# Patient Record
Sex: Female | Born: 1935 | Race: White | Hispanic: No | Marital: Married | Smoking: Never smoker
Health system: Southern US, Community
[De-identification: ages and names within clinical notes are randomized; demographics above are authoritative.]

## PROBLEM LIST (undated history)

## (undated) DIAGNOSIS — I480 Paroxysmal atrial fibrillation: Secondary | ICD-10-CM

## (undated) DIAGNOSIS — M549 Dorsalgia, unspecified: Secondary | ICD-10-CM

## (undated) DIAGNOSIS — H538 Other visual disturbances: Secondary | ICD-10-CM

## (undated) DIAGNOSIS — R079 Chest pain, unspecified: Secondary | ICD-10-CM

## (undated) DIAGNOSIS — J209 Acute bronchitis, unspecified: Secondary | ICD-10-CM

## (undated) DIAGNOSIS — K219 Gastro-esophageal reflux disease without esophagitis: Secondary | ICD-10-CM

## (undated) DIAGNOSIS — F0390 Unspecified dementia without behavioral disturbance: Secondary | ICD-10-CM

## (undated) DIAGNOSIS — F339 Major depressive disorder, recurrent, unspecified: Secondary | ICD-10-CM

## (undated) DIAGNOSIS — R5381 Other malaise: Secondary | ICD-10-CM

## (undated) DIAGNOSIS — I2510 Atherosclerotic heart disease of native coronary artery without angina pectoris: Secondary | ICD-10-CM

## (undated) DIAGNOSIS — I10 Essential (primary) hypertension: Secondary | ICD-10-CM

## (undated) DIAGNOSIS — R05 Cough: Secondary | ICD-10-CM

## (undated) DIAGNOSIS — E785 Hyperlipidemia, unspecified: Secondary | ICD-10-CM

## (undated) DIAGNOSIS — IMO0002 Reserved for concepts with insufficient information to code with codable children: Secondary | ICD-10-CM

## (undated) DIAGNOSIS — J309 Allergic rhinitis, unspecified: Secondary | ICD-10-CM

## (undated) DIAGNOSIS — F519 Sleep disorder not due to a substance or known physiological condition, unspecified: Secondary | ICD-10-CM

## (undated) DIAGNOSIS — M810 Age-related osteoporosis without current pathological fracture: Secondary | ICD-10-CM

## (undated) DIAGNOSIS — E049 Nontoxic goiter, unspecified: Secondary | ICD-10-CM

## (undated) DIAGNOSIS — R053 Chronic cough: Secondary | ICD-10-CM

## (undated) DIAGNOSIS — K5900 Constipation, unspecified: Secondary | ICD-10-CM

## (undated) DIAGNOSIS — I639 Cerebral infarction, unspecified: Secondary | ICD-10-CM

## (undated) DIAGNOSIS — R5383 Other fatigue: Secondary | ICD-10-CM

## (undated) DIAGNOSIS — Z8679 Personal history of other diseases of the circulatory system: Secondary | ICD-10-CM

## (undated) HISTORY — DX: Unspecified dementia without behavioral disturbance: F03.90

## (undated) HISTORY — DX: Hyperlipidemia, unspecified: E78.5

## (undated) HISTORY — DX: Constipation, unspecified: K59.00

## (undated) HISTORY — DX: Atherosclerotic heart disease of native coronary artery without angina pectoris: I25.10

## (undated) HISTORY — DX: Chronic cough: R05.3

## (undated) HISTORY — DX: Personal history of other diseases of the circulatory system: Z86.79

## (undated) HISTORY — DX: Essential (primary) hypertension: I10

## (undated) HISTORY — DX: Sleep disorder not due to a substance or known physiological condition, unspecified: F51.9

## (undated) HISTORY — DX: Cough: R05

## (undated) HISTORY — DX: Gastro-esophageal reflux disease without esophagitis: K21.9

## (undated) HISTORY — DX: Nontoxic goiter, unspecified: E04.9

## (undated) HISTORY — DX: Age-related osteoporosis without current pathological fracture: M81.0

## (undated) HISTORY — DX: Other visual disturbances: H53.8

## (undated) HISTORY — DX: Allergic rhinitis, unspecified: J30.9

## (undated) HISTORY — DX: Other malaise: R53.81

## (undated) HISTORY — DX: Other fatigue: R53.83

## (undated) HISTORY — DX: Acute bronchitis, unspecified: J20.9

## (undated) HISTORY — DX: Chest pain, unspecified: R07.9

## (undated) HISTORY — PX: DILATION AND CURETTAGE OF UTERUS: SHX78

## (undated) HISTORY — DX: Dorsalgia, unspecified: M54.9

---

## 1997-06-22 ENCOUNTER — Ambulatory Visit (HOSPITAL_COMMUNITY): Admission: RE | Admit: 1997-06-22 | Discharge: 1997-06-22 | Payer: Self-pay | Admitting: Gastroenterology

## 1998-07-12 ENCOUNTER — Ambulatory Visit (HOSPITAL_COMMUNITY): Admission: RE | Admit: 1998-07-12 | Discharge: 1998-07-12 | Payer: Self-pay | Admitting: Gastroenterology

## 1998-09-09 ENCOUNTER — Encounter: Payer: Self-pay | Admitting: Surgery

## 1998-09-09 ENCOUNTER — Ambulatory Visit (HOSPITAL_COMMUNITY): Admission: RE | Admit: 1998-09-09 | Discharge: 1998-09-09 | Payer: Self-pay | Admitting: Surgery

## 1998-10-05 ENCOUNTER — Ambulatory Visit (HOSPITAL_COMMUNITY): Admission: RE | Admit: 1998-10-05 | Discharge: 1998-10-05 | Payer: Self-pay | Admitting: Gastroenterology

## 2000-06-08 ENCOUNTER — Ambulatory Visit (HOSPITAL_COMMUNITY): Admission: RE | Admit: 2000-06-08 | Discharge: 2000-06-08 | Payer: Self-pay | Admitting: Gastroenterology

## 2000-06-08 ENCOUNTER — Encounter (INDEPENDENT_AMBULATORY_CARE_PROVIDER_SITE_OTHER): Payer: Self-pay | Admitting: *Deleted

## 2002-02-07 ENCOUNTER — Encounter: Payer: Self-pay | Admitting: Otolaryngology

## 2002-02-07 ENCOUNTER — Encounter: Admission: RE | Admit: 2002-02-07 | Discharge: 2002-02-07 | Payer: Self-pay | Admitting: Otolaryngology

## 2002-06-26 ENCOUNTER — Ambulatory Visit (HOSPITAL_COMMUNITY): Admission: RE | Admit: 2002-06-26 | Discharge: 2002-06-26 | Payer: Self-pay | Admitting: *Deleted

## 2002-06-26 ENCOUNTER — Encounter (INDEPENDENT_AMBULATORY_CARE_PROVIDER_SITE_OTHER): Payer: Self-pay | Admitting: Specialist

## 2003-11-28 ENCOUNTER — Inpatient Hospital Stay (HOSPITAL_COMMUNITY): Admission: EM | Admit: 2003-11-28 | Discharge: 2003-12-10 | Payer: Self-pay | Admitting: Emergency Medicine

## 2003-12-12 ENCOUNTER — Emergency Department (HOSPITAL_COMMUNITY): Admission: EM | Admit: 2003-12-12 | Discharge: 2003-12-13 | Payer: Self-pay | Admitting: Emergency Medicine

## 2003-12-21 ENCOUNTER — Encounter
Admission: RE | Admit: 2003-12-21 | Discharge: 2003-12-21 | Payer: Self-pay | Admitting: Thoracic Surgery (Cardiothoracic Vascular Surgery)

## 2004-01-26 ENCOUNTER — Encounter (HOSPITAL_COMMUNITY): Admission: RE | Admit: 2004-01-26 | Discharge: 2004-04-25 | Payer: Self-pay | Admitting: Cardiology

## 2004-04-08 ENCOUNTER — Ambulatory Visit: Payer: Self-pay | Admitting: Internal Medicine

## 2004-05-06 ENCOUNTER — Ambulatory Visit: Payer: Self-pay | Admitting: Internal Medicine

## 2004-06-02 ENCOUNTER — Ambulatory Visit: Payer: Self-pay | Admitting: Pulmonary Disease

## 2004-08-12 ENCOUNTER — Ambulatory Visit: Payer: Self-pay | Admitting: Internal Medicine

## 2004-09-09 ENCOUNTER — Ambulatory Visit: Payer: Self-pay | Admitting: Internal Medicine

## 2004-11-01 ENCOUNTER — Ambulatory Visit: Payer: Self-pay | Admitting: Internal Medicine

## 2004-12-02 ENCOUNTER — Encounter (INDEPENDENT_AMBULATORY_CARE_PROVIDER_SITE_OTHER): Payer: Self-pay | Admitting: Specialist

## 2004-12-02 ENCOUNTER — Ambulatory Visit (HOSPITAL_COMMUNITY): Admission: RE | Admit: 2004-12-02 | Discharge: 2004-12-02 | Payer: Self-pay | Admitting: Gastroenterology

## 2004-12-21 ENCOUNTER — Ambulatory Visit: Payer: Self-pay | Admitting: Internal Medicine

## 2005-01-04 ENCOUNTER — Ambulatory Visit: Payer: Self-pay | Admitting: Cardiovascular Disease

## 2005-01-04 ENCOUNTER — Encounter: Payer: Self-pay | Admitting: Cardiovascular Disease

## 2005-01-04 ENCOUNTER — Inpatient Hospital Stay (HOSPITAL_COMMUNITY): Admission: EM | Admit: 2005-01-04 | Discharge: 2005-01-05 | Payer: Self-pay | Admitting: Emergency Medicine

## 2005-01-12 ENCOUNTER — Ambulatory Visit: Payer: Self-pay | Admitting: Internal Medicine

## 2005-01-27 ENCOUNTER — Ambulatory Visit: Payer: Self-pay | Admitting: Internal Medicine

## 2005-01-31 ENCOUNTER — Ambulatory Visit: Payer: Self-pay | Admitting: Internal Medicine

## 2005-03-02 ENCOUNTER — Ambulatory Visit: Payer: Self-pay | Admitting: Internal Medicine

## 2005-03-09 ENCOUNTER — Ambulatory Visit: Payer: Self-pay | Admitting: Internal Medicine

## 2005-05-03 ENCOUNTER — Ambulatory Visit: Payer: Self-pay | Admitting: Internal Medicine

## 2005-05-17 ENCOUNTER — Ambulatory Visit: Payer: Self-pay | Admitting: Internal Medicine

## 2005-05-18 ENCOUNTER — Ambulatory Visit: Payer: Self-pay | Admitting: Cardiology

## 2005-06-02 ENCOUNTER — Ambulatory Visit: Payer: Self-pay | Admitting: Internal Medicine

## 2005-06-16 ENCOUNTER — Ambulatory Visit: Payer: Self-pay | Admitting: Endocrinology

## 2005-06-23 ENCOUNTER — Ambulatory Visit: Payer: Self-pay | Admitting: Internal Medicine

## 2005-06-29 ENCOUNTER — Ambulatory Visit: Payer: Self-pay | Admitting: Internal Medicine

## 2005-07-10 ENCOUNTER — Ambulatory Visit: Payer: Self-pay | Admitting: Internal Medicine

## 2005-07-21 ENCOUNTER — Ambulatory Visit (HOSPITAL_BASED_OUTPATIENT_CLINIC_OR_DEPARTMENT_OTHER): Admission: RE | Admit: 2005-07-21 | Discharge: 2005-07-21 | Payer: Self-pay | Admitting: Orthopedic Surgery

## 2005-08-03 ENCOUNTER — Encounter: Admission: RE | Admit: 2005-08-03 | Discharge: 2005-08-30 | Payer: Self-pay | Admitting: Orthopedic Surgery

## 2005-08-21 ENCOUNTER — Ambulatory Visit: Payer: Self-pay | Admitting: Internal Medicine

## 2005-08-29 ENCOUNTER — Ambulatory Visit: Payer: Self-pay | Admitting: Internal Medicine

## 2005-08-31 ENCOUNTER — Encounter: Admission: RE | Admit: 2005-08-31 | Discharge: 2005-09-21 | Payer: Self-pay | Admitting: Orthopedic Surgery

## 2005-10-09 ENCOUNTER — Ambulatory Visit: Payer: Self-pay | Admitting: Internal Medicine

## 2005-10-16 ENCOUNTER — Ambulatory Visit: Payer: Self-pay | Admitting: Internal Medicine

## 2005-12-06 ENCOUNTER — Ambulatory Visit: Payer: Self-pay | Admitting: Internal Medicine

## 2005-12-21 ENCOUNTER — Ambulatory Visit: Payer: Self-pay | Admitting: Internal Medicine

## 2006-01-23 ENCOUNTER — Ambulatory Visit: Payer: Self-pay | Admitting: Internal Medicine

## 2006-02-07 ENCOUNTER — Ambulatory Visit: Payer: Self-pay | Admitting: Internal Medicine

## 2006-05-22 ENCOUNTER — Ambulatory Visit: Payer: Self-pay | Admitting: Internal Medicine

## 2006-06-04 DIAGNOSIS — K219 Gastro-esophageal reflux disease without esophagitis: Secondary | ICD-10-CM

## 2006-06-04 HISTORY — DX: Gastro-esophageal reflux disease without esophagitis: K21.9

## 2006-07-23 ENCOUNTER — Ambulatory Visit: Payer: Self-pay | Admitting: Internal Medicine

## 2006-07-24 DIAGNOSIS — Z8679 Personal history of other diseases of the circulatory system: Secondary | ICD-10-CM

## 2006-07-24 DIAGNOSIS — I2510 Atherosclerotic heart disease of native coronary artery without angina pectoris: Secondary | ICD-10-CM

## 2006-07-24 HISTORY — DX: Atherosclerotic heart disease of native coronary artery without angina pectoris: I25.10

## 2006-07-24 HISTORY — DX: Personal history of other diseases of the circulatory system: Z86.79

## 2006-09-05 ENCOUNTER — Ambulatory Visit: Payer: Self-pay | Admitting: Internal Medicine

## 2006-09-05 LAB — CONVERTED CEMR LAB
Basophils Absolute: 0.1 10*3/uL (ref 0.0–0.1)
Basophils Relative: 1.2 % — ABNORMAL HIGH (ref 0.0–1.0)
Cholesterol: 223 mg/dL (ref 0–200)
Direct LDL: 134.6 mg/dL
Eosinophils Absolute: 0.1 10*3/uL (ref 0.0–0.6)
Eosinophils Relative: 1.1 % (ref 0.0–5.0)
HCT: 37.4 % (ref 36.0–46.0)
HDL: 29.8 mg/dL — ABNORMAL LOW (ref >39.0)
Hemoglobin: 13.2 g/dL (ref 12.0–15.0)
Lymphocytes Relative: 30.9 % (ref 12.0–46.0)
MCHC: 35.2 g/dL (ref 30.0–36.0)
MCV: 92.5 fL (ref 78.0–100.0)
Monocytes Absolute: 0.4 10*3/uL (ref 0.2–0.7)
Monocytes Relative: 6.8 % (ref 3.0–11.0)
Neutro Abs: 3.2 10*3/uL (ref 1.4–7.7)
Neutrophils Relative %: 60 % (ref 43.0–77.0)
Platelets: 211 10*3/uL (ref 150–400)
RBC: 4.05 M/uL (ref 3.87–5.11)
RDW: 11.9 % (ref 11.5–14.6)
Total CHOL/HDL Ratio: 7.5
Triglycerides: 275 mg/dL (ref 0–149)
VLDL: 55 mg/dL — ABNORMAL HIGH (ref 0–40)
WBC: 5.5 10*3/uL (ref 4.5–10.5)

## 2006-09-13 ENCOUNTER — Ambulatory Visit (HOSPITAL_COMMUNITY): Admission: RE | Admit: 2006-09-13 | Discharge: 2006-09-13 | Payer: Self-pay | Admitting: Internal Medicine

## 2006-09-20 ENCOUNTER — Encounter: Admission: RE | Admit: 2006-09-20 | Discharge: 2006-09-20 | Payer: Self-pay | Admitting: Internal Medicine

## 2006-10-01 ENCOUNTER — Ambulatory Visit: Payer: Self-pay | Admitting: Internal Medicine

## 2006-11-07 ENCOUNTER — Telehealth: Payer: Self-pay | Admitting: Internal Medicine

## 2006-11-09 ENCOUNTER — Ambulatory Visit: Payer: Self-pay | Admitting: Internal Medicine

## 2006-11-09 DIAGNOSIS — J309 Allergic rhinitis, unspecified: Secondary | ICD-10-CM

## 2006-11-09 DIAGNOSIS — F519 Sleep disorder not due to a substance or known physiological condition, unspecified: Secondary | ICD-10-CM | POA: Insufficient documentation

## 2006-11-09 DIAGNOSIS — K140 Glossitis: Secondary | ICD-10-CM | POA: Insufficient documentation

## 2006-11-09 DIAGNOSIS — I10 Essential (primary) hypertension: Secondary | ICD-10-CM | POA: Insufficient documentation

## 2006-11-09 DIAGNOSIS — J45909 Unspecified asthma, uncomplicated: Secondary | ICD-10-CM | POA: Insufficient documentation

## 2006-11-09 DIAGNOSIS — J069 Acute upper respiratory infection, unspecified: Secondary | ICD-10-CM | POA: Insufficient documentation

## 2006-11-09 HISTORY — DX: Essential (primary) hypertension: I10

## 2006-11-09 HISTORY — DX: Allergic rhinitis, unspecified: J30.9

## 2006-11-09 HISTORY — DX: Sleep disorder not due to a substance or known physiological condition, unspecified: F51.9

## 2006-12-07 ENCOUNTER — Encounter: Payer: Self-pay | Admitting: Internal Medicine

## 2006-12-12 ENCOUNTER — Encounter: Payer: Self-pay | Admitting: Internal Medicine

## 2006-12-19 ENCOUNTER — Telehealth (INDEPENDENT_AMBULATORY_CARE_PROVIDER_SITE_OTHER): Payer: Self-pay | Admitting: *Deleted

## 2006-12-20 ENCOUNTER — Ambulatory Visit: Payer: Self-pay | Admitting: Internal Medicine

## 2006-12-20 DIAGNOSIS — E785 Hyperlipidemia, unspecified: Secondary | ICD-10-CM

## 2006-12-20 HISTORY — DX: Hyperlipidemia, unspecified: E78.5

## 2006-12-20 LAB — CONVERTED CEMR LAB
ALT: 22 units/L (ref 0–35)
AST: 21 units/L (ref 0–37)
Albumin: 3.6 g/dL (ref 3.5–5.2)
Alkaline Phosphatase: 92 units/L (ref 39–117)
Bilirubin, Direct: 0.1 mg/dL (ref 0.0–0.3)
Cholesterol: 167 mg/dL (ref 0–200)
Direct LDL: 93.1 mg/dL
HDL: 40.3 mg/dL (ref >39.0)
Total Bilirubin: 0.5 mg/dL (ref 0.3–1.2)
Total CHOL/HDL Ratio: 4.1
Total Protein: 6.6 g/dL (ref 6.0–8.3)
Triglycerides: 201 mg/dL (ref 0–149)
VLDL: 40 mg/dL (ref 0–40)

## 2007-01-14 ENCOUNTER — Ambulatory Visit: Payer: Self-pay | Admitting: Internal Medicine

## 2007-01-14 DIAGNOSIS — J209 Acute bronchitis, unspecified: Secondary | ICD-10-CM | POA: Insufficient documentation

## 2007-01-14 HISTORY — DX: Acute bronchitis, unspecified: J20.9

## 2007-01-30 ENCOUNTER — Encounter: Payer: Self-pay | Admitting: Internal Medicine

## 2007-02-04 ENCOUNTER — Encounter: Payer: Self-pay | Admitting: Internal Medicine

## 2007-02-11 ENCOUNTER — Ambulatory Visit: Payer: Self-pay | Admitting: Internal Medicine

## 2007-02-11 ENCOUNTER — Encounter: Admission: RE | Admit: 2007-02-11 | Discharge: 2007-02-11 | Payer: Self-pay | Admitting: Otolaryngology

## 2007-02-11 ENCOUNTER — Telehealth (INDEPENDENT_AMBULATORY_CARE_PROVIDER_SITE_OTHER): Payer: Self-pay | Admitting: *Deleted

## 2007-02-20 ENCOUNTER — Encounter: Payer: Self-pay | Admitting: Internal Medicine

## 2007-07-04 ENCOUNTER — Encounter: Payer: Self-pay | Admitting: Internal Medicine

## 2007-09-12 ENCOUNTER — Ambulatory Visit: Payer: Self-pay | Admitting: Internal Medicine

## 2007-09-12 DIAGNOSIS — R079 Chest pain, unspecified: Secondary | ICD-10-CM | POA: Insufficient documentation

## 2007-09-12 HISTORY — DX: Chest pain, unspecified: R07.9

## 2007-12-30 ENCOUNTER — Telehealth (INDEPENDENT_AMBULATORY_CARE_PROVIDER_SITE_OTHER): Payer: Self-pay | Admitting: *Deleted

## 2008-01-07 ENCOUNTER — Ambulatory Visit: Payer: Self-pay | Admitting: Internal Medicine

## 2008-01-21 ENCOUNTER — Ambulatory Visit: Payer: Self-pay | Admitting: Internal Medicine

## 2008-01-21 DIAGNOSIS — R5381 Other malaise: Secondary | ICD-10-CM

## 2008-01-21 DIAGNOSIS — R5383 Other fatigue: Secondary | ICD-10-CM

## 2008-01-21 HISTORY — DX: Other fatigue: R53.83

## 2008-01-21 HISTORY — DX: Other malaise: R53.81

## 2008-01-22 LAB — CONVERTED CEMR LAB
ALT: 15 units/L (ref 0–35)
AST: 18 units/L (ref 0–37)
Albumin: 3.8 g/dL (ref 3.5–5.2)
Alkaline Phosphatase: 78 units/L (ref 39–117)
BUN: 18 mg/dL (ref 6–23)
Basophils Absolute: 0 10*3/uL (ref 0.0–0.1)
Basophils Relative: 0.6 % (ref 0.0–3.0)
Bilirubin, Direct: 0.1 mg/dL (ref 0.0–0.3)
CO2: 32 meq/L (ref 19–32)
Calcium: 9.8 mg/dL (ref 8.4–10.5)
Chloride: 104 meq/L (ref 96–112)
Cholesterol: 230 mg/dL (ref 0–200)
Creatinine, Ser: 0.7 mg/dL (ref 0.4–1.2)
Direct LDL: 131.1 mg/dL
Eosinophils Absolute: 0.1 10*3/uL (ref 0.0–0.7)
Eosinophils Relative: 2.3 % (ref 0.0–5.0)
Folate: >20 ng/mL
GFR calc Af Amer: 105 mL/min
GFR calc non Af Amer: 87 mL/min
Glucose, Bld: 102 mg/dL — ABNORMAL HIGH (ref 70–99)
HCT: 38.4 % (ref 36.0–46.0)
HDL: 30.8 mg/dL — ABNORMAL LOW (ref >39.0)
Hemoglobin: 13.8 g/dL (ref 12.0–15.0)
Lymphocytes Relative: 40.7 % (ref 12.0–46.0)
MCHC: 35.9 g/dL (ref 30.0–36.0)
MCV: 92.7 fL (ref 78.0–100.0)
Monocytes Absolute: 0.5 10*3/uL (ref 0.1–1.0)
Monocytes Relative: 9.2 % (ref 3.0–12.0)
Neutro Abs: 2.8 10*3/uL (ref 1.4–7.7)
Neutrophils Relative %: 47.2 % (ref 43.0–77.0)
Platelets: 181 10*3/uL (ref 150–400)
Potassium: 4 meq/L (ref 3.5–5.1)
RBC: 4.15 M/uL (ref 3.87–5.11)
RDW: 12.5 % (ref 11.5–14.6)
Sed Rate: 10 mm/hr (ref 0–22)
Sodium: 141 meq/L (ref 135–145)
TSH: 2.51 microintl units/mL (ref 0.35–5.50)
Total Bilirubin: 0.8 mg/dL (ref 0.3–1.2)
Total CHOL/HDL Ratio: 7.5
Total Protein: 6.6 g/dL (ref 6.0–8.3)
Triglycerides: 485 mg/dL (ref 0–149)
VLDL: 97 mg/dL — ABNORMAL HIGH (ref 0–40)
Vitamin B-12: 266 pg/mL (ref 211–911)
WBC: 5.7 10*3/uL (ref 4.5–10.5)

## 2008-02-05 ENCOUNTER — Encounter: Payer: Self-pay | Admitting: Internal Medicine

## 2008-02-28 ENCOUNTER — Ambulatory Visit: Payer: Self-pay | Admitting: Internal Medicine

## 2008-02-28 DIAGNOSIS — R070 Pain in throat: Secondary | ICD-10-CM | POA: Insufficient documentation

## 2008-02-28 DIAGNOSIS — B370 Candidal stomatitis: Secondary | ICD-10-CM | POA: Insufficient documentation

## 2008-03-02 ENCOUNTER — Telehealth (INDEPENDENT_AMBULATORY_CARE_PROVIDER_SITE_OTHER): Payer: Self-pay | Admitting: *Deleted

## 2008-03-05 ENCOUNTER — Ambulatory Visit: Payer: Self-pay | Admitting: Internal Medicine

## 2008-03-05 LAB — CONVERTED CEMR LAB
ALT: 16 units/L (ref 0–35)
AST: 19 units/L (ref 0–37)
Albumin: 3.8 g/dL (ref 3.5–5.2)
Alkaline Phosphatase: 68 units/L (ref 39–117)
Bilirubin, Direct: 0.1 mg/dL (ref 0.0–0.3)
Cholesterol: 168 mg/dL (ref 0–200)
HDL: 42.7 mg/dL (ref >39.0)
LDL Cholesterol: 96 mg/dL (ref 0–99)
Total Bilirubin: 0.6 mg/dL (ref 0.3–1.2)
Total CHOL/HDL Ratio: 3.9
Total Protein: 6.8 g/dL (ref 6.0–8.3)
Triglycerides: 149 mg/dL (ref 0–149)
VLDL: 30 mg/dL (ref 0–40)

## 2008-03-09 ENCOUNTER — Ambulatory Visit: Payer: Self-pay | Admitting: Internal Medicine

## 2008-03-09 DIAGNOSIS — R413 Other amnesia: Secondary | ICD-10-CM

## 2008-03-10 ENCOUNTER — Telehealth (INDEPENDENT_AMBULATORY_CARE_PROVIDER_SITE_OTHER): Payer: Self-pay | Admitting: *Deleted

## 2008-03-14 ENCOUNTER — Telehealth (INDEPENDENT_AMBULATORY_CARE_PROVIDER_SITE_OTHER): Payer: Self-pay | Admitting: *Deleted

## 2008-03-27 ENCOUNTER — Ambulatory Visit: Payer: Self-pay | Admitting: Endocrinology

## 2008-04-04 ENCOUNTER — Ambulatory Visit: Payer: Self-pay | Admitting: Family Medicine

## 2008-04-04 ENCOUNTER — Telehealth: Payer: Self-pay | Admitting: Family Medicine

## 2008-04-04 DIAGNOSIS — M25569 Pain in unspecified knee: Secondary | ICD-10-CM

## 2008-04-09 ENCOUNTER — Encounter: Payer: Self-pay | Admitting: Internal Medicine

## 2008-05-01 ENCOUNTER — Encounter: Payer: Self-pay | Admitting: Internal Medicine

## 2008-05-19 ENCOUNTER — Encounter: Payer: Self-pay | Admitting: Internal Medicine

## 2008-05-25 ENCOUNTER — Ambulatory Visit: Payer: Self-pay | Admitting: Internal Medicine

## 2008-06-29 ENCOUNTER — Ambulatory Visit: Payer: Self-pay | Admitting: Internal Medicine

## 2008-06-29 DIAGNOSIS — H538 Other visual disturbances: Secondary | ICD-10-CM

## 2008-06-29 HISTORY — DX: Other visual disturbances: H53.8

## 2008-06-30 LAB — CONVERTED CEMR LAB
ALT: 14 units/L (ref 0–35)
AST: 22 units/L (ref 0–37)
Albumin: 4 g/dL (ref 3.5–5.2)
Alkaline Phosphatase: 95 units/L (ref 39–117)
Bilirubin, Direct: 0.1 mg/dL (ref 0.0–0.3)
Cholesterol: 150 mg/dL (ref 0–200)
Direct LDL: 84.1 mg/dL
HDL: 36.6 mg/dL — ABNORMAL LOW (ref >39.00)
Total Bilirubin: 0.6 mg/dL (ref 0.3–1.2)
Total CHOL/HDL Ratio: 4
Total Protein: 7.1 g/dL (ref 6.0–8.3)
Triglycerides: 223 mg/dL — ABNORMAL HIGH (ref 0.0–149.0)
VLDL: 44.6 mg/dL — ABNORMAL HIGH (ref 0.0–40.0)

## 2008-07-01 ENCOUNTER — Encounter: Payer: Self-pay | Admitting: Internal Medicine

## 2008-07-07 ENCOUNTER — Ambulatory Visit: Payer: Self-pay | Admitting: Internal Medicine

## 2008-07-07 DIAGNOSIS — J45991 Cough variant asthma: Secondary | ICD-10-CM

## 2008-07-07 LAB — CONVERTED CEMR LAB
Basophils Absolute: 0 10*3/uL (ref 0.0–0.1)
Basophils Relative: 0.3 % (ref 0.0–3.0)
CRP, High Sensitivity: 1 (ref 0.00–5.00)
Eosinophils Absolute: 0.2 10*3/uL (ref 0.0–0.7)
Eosinophils Relative: 3.1 % (ref 0.0–5.0)
HCT: 38.8 % (ref 36.0–46.0)
Hemoglobin: 13.4 g/dL (ref 12.0–15.0)
Lymphocytes Relative: 28.4 % (ref 12.0–46.0)
Lymphs Abs: 1.4 10*3/uL (ref 0.7–4.0)
MCHC: 34.6 g/dL (ref 30.0–36.0)
MCV: 94.6 fL (ref 78.0–100.0)
Monocytes Absolute: 0.3 10*3/uL (ref 0.1–1.0)
Monocytes Relative: 5.8 % (ref 3.0–12.0)
Neutro Abs: 3 10*3/uL (ref 1.4–7.7)
Neutrophils Relative %: 62.4 % (ref 43.0–77.0)
Platelets: 162 10*3/uL (ref 150.0–400.0)
RBC: 4.1 M/uL (ref 3.87–5.11)
RDW: 12.2 % (ref 11.5–14.6)
Sed Rate: 10 mm/hr (ref 0–22)
WBC: 4.9 10*3/uL (ref 4.5–10.5)

## 2008-07-09 ENCOUNTER — Telehealth (INDEPENDENT_AMBULATORY_CARE_PROVIDER_SITE_OTHER): Payer: Self-pay | Admitting: *Deleted

## 2008-07-21 ENCOUNTER — Ambulatory Visit: Payer: Self-pay | Admitting: Internal Medicine

## 2008-10-14 ENCOUNTER — Ambulatory Visit: Payer: Self-pay | Admitting: Internal Medicine

## 2008-10-14 DIAGNOSIS — R062 Wheezing: Secondary | ICD-10-CM

## 2008-12-14 ENCOUNTER — Ambulatory Visit: Payer: Self-pay | Admitting: Internal Medicine

## 2008-12-16 ENCOUNTER — Ambulatory Visit: Payer: Self-pay | Admitting: Internal Medicine

## 2008-12-18 ENCOUNTER — Telehealth: Payer: Self-pay | Admitting: Internal Medicine

## 2008-12-19 ENCOUNTER — Telehealth: Payer: Self-pay | Admitting: Internal Medicine

## 2008-12-23 ENCOUNTER — Ambulatory Visit (HOSPITAL_COMMUNITY): Admission: RE | Admit: 2008-12-23 | Discharge: 2008-12-23 | Payer: Self-pay | Admitting: Internal Medicine

## 2009-01-04 ENCOUNTER — Encounter: Payer: Self-pay | Admitting: Internal Medicine

## 2009-01-06 ENCOUNTER — Ambulatory Visit: Payer: Self-pay | Admitting: Internal Medicine

## 2009-01-06 DIAGNOSIS — F0390 Unspecified dementia without behavioral disturbance: Secondary | ICD-10-CM

## 2009-01-06 HISTORY — DX: Unspecified dementia, unspecified severity, without behavioral disturbance, psychotic disturbance, mood disturbance, and anxiety: F03.90

## 2009-01-25 ENCOUNTER — Ambulatory Visit: Payer: Self-pay | Admitting: Internal Medicine

## 2009-01-26 LAB — CONVERTED CEMR LAB
Bilirubin Urine: NEGATIVE
Ketones, ur: NEGATIVE mg/dL
Nitrite: NEGATIVE
Specific Gravity, Urine: 1.025 (ref 1.000–1.030)
Total Protein, Urine: NEGATIVE mg/dL
Urine Glucose: NEGATIVE mg/dL
Urobilinogen, UA: 0.2 (ref 0.0–1.0)
pH: 6 (ref 5.0–8.0)

## 2009-02-22 ENCOUNTER — Ambulatory Visit: Payer: Self-pay | Admitting: Internal Medicine

## 2009-02-22 DIAGNOSIS — R112 Nausea with vomiting, unspecified: Secondary | ICD-10-CM

## 2009-02-22 DIAGNOSIS — J45901 Unspecified asthma with (acute) exacerbation: Secondary | ICD-10-CM | POA: Insufficient documentation

## 2009-03-01 ENCOUNTER — Ambulatory Visit: Payer: Self-pay | Admitting: Internal Medicine

## 2009-03-01 DIAGNOSIS — K5900 Constipation, unspecified: Secondary | ICD-10-CM | POA: Insufficient documentation

## 2009-03-01 DIAGNOSIS — M549 Dorsalgia, unspecified: Secondary | ICD-10-CM

## 2009-03-01 DIAGNOSIS — R10816 Epigastric abdominal tenderness: Secondary | ICD-10-CM

## 2009-03-01 HISTORY — DX: Dorsalgia, unspecified: M54.9

## 2009-03-01 HISTORY — DX: Constipation, unspecified: K59.00

## 2009-03-22 ENCOUNTER — Ambulatory Visit: Payer: Self-pay | Admitting: Internal Medicine

## 2009-04-19 ENCOUNTER — Encounter: Payer: Self-pay | Admitting: Internal Medicine

## 2009-05-21 ENCOUNTER — Ambulatory Visit: Payer: Self-pay | Admitting: Internal Medicine

## 2009-07-26 ENCOUNTER — Ambulatory Visit: Payer: Self-pay | Admitting: Internal Medicine

## 2009-07-26 DIAGNOSIS — M51379 Other intervertebral disc degeneration, lumbosacral region without mention of lumbar back pain or lower extremity pain: Secondary | ICD-10-CM | POA: Insufficient documentation

## 2009-07-26 DIAGNOSIS — M5137 Other intervertebral disc degeneration, lumbosacral region: Secondary | ICD-10-CM | POA: Insufficient documentation

## 2009-07-26 DIAGNOSIS — N959 Unspecified menopausal and perimenopausal disorder: Secondary | ICD-10-CM | POA: Insufficient documentation

## 2009-07-27 LAB — CONVERTED CEMR LAB
ALT: 18 units/L (ref 0–35)
AST: 24 units/L (ref 0–37)
Albumin: 3.9 g/dL (ref 3.5–5.2)
Alkaline Phosphatase: 71 units/L (ref 39–117)
BUN: 16 mg/dL (ref 6–23)
Basophils Absolute: 0 10*3/uL (ref 0.0–0.1)
Basophils Relative: 0.7 % (ref 0.0–3.0)
Bilirubin Urine: NEGATIVE
Bilirubin, Direct: 0.1 mg/dL (ref 0.0–0.3)
CO2: 26 meq/L (ref 19–32)
Calcium: 9.5 mg/dL (ref 8.4–10.5)
Chloride: 104 meq/L (ref 96–112)
Cholesterol: 238 mg/dL — ABNORMAL HIGH (ref 0–200)
Creatinine, Ser: 0.7 mg/dL (ref 0.4–1.2)
Direct LDL: 168.6 mg/dL
Eosinophils Absolute: 0.1 10*3/uL (ref 0.0–0.7)
Eosinophils Relative: 1.6 % (ref 0.0–5.0)
Folate: 13.1 ng/mL
GFR calc non Af Amer: 82.13 mL/min (ref >60)
Glucose, Bld: 103 mg/dL — ABNORMAL HIGH (ref 70–99)
HCT: 41.4 % (ref 36.0–46.0)
HDL: 38.3 mg/dL — ABNORMAL LOW (ref >39.00)
Hemoglobin: 14.2 g/dL (ref 12.0–15.0)
Iron: 84 ug/dL (ref 42–145)
Ketones, ur: NEGATIVE mg/dL
Lymphocytes Relative: 32.9 % (ref 12.0–46.0)
Lymphs Abs: 2.2 10*3/uL (ref 0.7–4.0)
MCHC: 34.3 g/dL (ref 30.0–36.0)
MCV: 93.3 fL (ref 78.0–100.0)
Monocytes Absolute: 0.5 10*3/uL (ref 0.1–1.0)
Monocytes Relative: 7.5 % (ref 3.0–12.0)
Neutro Abs: 3.8 10*3/uL (ref 1.4–7.7)
Neutrophils Relative %: 57.3 % (ref 43.0–77.0)
Nitrite: POSITIVE
Platelets: 207 10*3/uL (ref 150.0–400.0)
Potassium: 4.7 meq/L (ref 3.5–5.1)
RBC: 4.43 M/uL (ref 3.87–5.11)
RDW: 13 % (ref 11.5–14.6)
Saturation Ratios: 25.9 % (ref 20.0–50.0)
Sed Rate: 9 mm/hr (ref 0–22)
Sodium: 138 meq/L (ref 135–145)
Specific Gravity, Urine: >=1.03 (ref 1.000–1.030)
TSH: 1.73 microintl units/mL (ref 0.35–5.50)
Total Bilirubin: 0.6 mg/dL (ref 0.3–1.2)
Total CHOL/HDL Ratio: 6
Total Protein: 6.5 g/dL (ref 6.0–8.3)
Transferrin: 232.1 mg/dL (ref 212.0–360.0)
Triglycerides: 257 mg/dL — ABNORMAL HIGH (ref 0.0–149.0)
Urine Glucose: NEGATIVE mg/dL
Urobilinogen, UA: 0.2 (ref 0.0–1.0)
VLDL: 51.4 mg/dL — ABNORMAL HIGH (ref 0.0–40.0)
Vit D, 25-Hydroxy: 15 ng/mL — ABNORMAL LOW (ref 30–89)
Vitamin B-12: 290 pg/mL (ref 211–911)
WBC: 6.6 10*3/uL (ref 4.5–10.5)
pH: 5.5 (ref 5.0–8.0)

## 2009-07-29 ENCOUNTER — Telehealth: Payer: Self-pay | Admitting: Internal Medicine

## 2009-07-30 ENCOUNTER — Encounter: Admission: RE | Admit: 2009-07-30 | Discharge: 2009-07-30 | Payer: Self-pay | Admitting: Internal Medicine

## 2009-07-30 LAB — HM MAMMOGRAPHY: HM Mammogram: NEGATIVE

## 2009-08-02 ENCOUNTER — Ambulatory Visit: Payer: Self-pay | Admitting: Internal Medicine

## 2009-08-02 ENCOUNTER — Encounter: Payer: Self-pay | Admitting: Internal Medicine

## 2009-08-02 DIAGNOSIS — R21 Rash and other nonspecific skin eruption: Secondary | ICD-10-CM | POA: Insufficient documentation

## 2009-08-30 ENCOUNTER — Encounter: Payer: Self-pay | Admitting: Internal Medicine

## 2009-08-30 ENCOUNTER — Ambulatory Visit: Payer: Self-pay | Admitting: Family Medicine

## 2009-10-01 ENCOUNTER — Telehealth: Payer: Self-pay | Admitting: Internal Medicine

## 2009-10-13 ENCOUNTER — Ambulatory Visit: Payer: Self-pay | Admitting: Internal Medicine

## 2009-10-13 ENCOUNTER — Telehealth: Payer: Self-pay | Admitting: Internal Medicine

## 2009-10-13 DIAGNOSIS — M810 Age-related osteoporosis without current pathological fracture: Secondary | ICD-10-CM | POA: Insufficient documentation

## 2009-10-13 HISTORY — DX: Age-related osteoporosis without current pathological fracture: M81.0

## 2010-01-24 ENCOUNTER — Encounter: Payer: Self-pay | Admitting: Internal Medicine

## 2010-02-01 ENCOUNTER — Ambulatory Visit
Admission: RE | Admit: 2010-02-01 | Discharge: 2010-02-01 | Payer: Self-pay | Source: Home / Self Care | Attending: Internal Medicine | Admitting: Internal Medicine

## 2010-02-01 DIAGNOSIS — E049 Nontoxic goiter, unspecified: Secondary | ICD-10-CM | POA: Insufficient documentation

## 2010-02-01 DIAGNOSIS — R059 Cough, unspecified: Secondary | ICD-10-CM

## 2010-02-01 DIAGNOSIS — R05 Cough: Secondary | ICD-10-CM | POA: Insufficient documentation

## 2010-02-01 HISTORY — DX: Cough, unspecified: R05.9

## 2010-02-01 HISTORY — DX: Nontoxic goiter, unspecified: E04.9

## 2010-02-01 NOTE — Assessment & Plan Note (Signed)
Summary: LOWER BACK PAIN  D/T   STC   Vital Signs:  Patient profile:   77 year old female Height:      62 inches Weight:      160.50 pounds BMI:     29.46 O2 Sat:      96 % on Room air Temp:     97.3 degrees F oral Pulse rate:   84 / minute BP sitting:   110 / 62  (left arm) Cuff size:   regular  Vitals Entered ByZella Ball Ewing (March 22, 2009 11:22 AM)  O2 Flow:  Room air CC: Low back pain/RE   Primary Care Provider:  Corwin Levins MD  CC:  Low back pain/RE.  History of Present Illness: here with 1 wk right lower back pain, mild to mod, worse to stand up,  not radicular and not assoc with LE pain, weakness, numb, bowel or bladder change, fever, night sweats, wt loss.  Pt denies CP, sob, doe, wheezing, orthopnea, pnd, worsening LE edema, palps, dizziness or syncope  Pt denies new neuro symptoms such as headache, facial or extremity weakness   Tolerating the aricept well, famly friend here asks if can increase the aricept.   Problems Prior to Update: 1)  Back Pain  (ICD-724.5) 2)  Constipation  (ICD-564.00) 3)  Epigastric Tenderness  (ICD-789.66) 4)  Nausea With Vomiting  (ICD-787.01) 5)  Asthma Unspecified With Exacerbation  (ICD-493.92) 6)  Fatigue  (ICD-780.79) 7)  Senile Dementia, Uncomplicated  (ICD-290.0) 8)  Memory Loss  (ICD-780.93) 9)  Wheezing  (ICD-786.07) 10)  Cough Variant Asthma  (ICD-493.82) 11)  Allergic Rhinitis  (ICD-477.9) 12)  Blurred Vision  (ICD-368.8) 13)  Knee Pain, Left  (ICD-719.46) 14)  Memory Loss  (ICD-780.93) 15)  Thrush  (ICD-112.0) 16)  Throat Pain  (ICD-784.1) 17)  Fatigue  (ICD-780.79) 18)  Asthmatic Bronchitis, Acute  (ICD-466.0) 19)  Chest Pain  (ICD-786.50) 20)  Glossitis  (ICD-529.0) 21)  Asthmatic Bronchitis, Acute  (ICD-466.0) 22)  Hepatotoxicity, Drug-induced, Risk of  (ICD-V58.69) 23)  Hyperlipidemia  (ICD-272.4) 24)  Insomnia-sleep Disorder-unspec  (ICD-307.40) 25)  Glossitis  (ICD-529.0) 26)  Uri  (ICD-465.9) 27)  Family  History of Cad Female 1st Degree Relative <50  (ICD-V17.3) 28)  Family History of Cad Female 1st Degree Relative <60  (ICD-V16.49) 29)  Asthma  (ICD-493.90) 30)  Hypertension  (ICD-401.9) 31)  Allergic Rhinitis  (ICD-477.9) 32)  Cerebrovascular Accident, Hx of  (ICD-V12.50) 33)  Coronary Artery Disease  (ICD-414.00) 34)  Gerd  (ICD-530.81)  Medications Prior to Update: 1)  Nexium 20 Mg Pack (Esomeprazole Magnesium) .Marland Kitchen.. 1 By Mouth Once Daily 2)  Fluticasone Propionate 50 Mcg/act Susp (Fluticasone Propionate) .... 2 Spray/side Once Daily 3)  Aricept 5 Mg Tabs (Donepezil Hcl) .Marland Kitchen.. 1po Once Daily 4)  Magic Mouthwash .... 5 Cc By Mouth Swish and Spit Qid As Needed 5)  Aspir-Low 81 Mg Tbec (Aspirin) .Marland Kitchen.. 1 By Mouth Once Daily 6)  Promethazine Hcl 25 Mg Tabs (Promethazine Hcl) .Marland Kitchen.. 1 By Mouth Every 4 Hours As Needed For Nausea and Vomitting 7)  Prednisone 10 Mg Tabs (Prednisone) .... 3po Qd For 3days, Then 2po Qd For 3days, Then 1po Qd For 3days, Then Stop 8)  Hydrocodone-Homatropine 5-1.5 Mg/54ml Syrp (Hydrocodone-Homatropine) .Marland Kitchen.. 1 Tsp By Mouth Q 6 Hrs As Needed Cough 9)  Proair Hfa 108 (90 Base) Mcg/act Aers (Albuterol Sulfate) .... 2 Puffs Four Times Per Day As Needed For Wheezing or Shortness or Breath  Current Medications (  verified): 1)  Nexium 20 Mg Pack (Esomeprazole Magnesium) .Marland Kitchen.. 1 By Mouth Once Daily 2)  Fluticasone Propionate 50 Mcg/act Susp (Fluticasone Propionate) .... 2 Spray/side Once Daily 3)  Aricept 10 Mg Tabs (Donepezil Hcl) .Marland Kitchen.. 1po Once Daily 4)  Magic Mouthwash .... 5 Cc By Mouth Swish and Spit Qid As Needed 5)  Aspir-Low 81 Mg Tbec (Aspirin) .Marland Kitchen.. 1 By Mouth Once Daily 6)  Promethazine Hcl 25 Mg Tabs (Promethazine Hcl) .Marland Kitchen.. 1 By Mouth Every 4 Hours As Needed For Nausea and Vomitting 7)  Prednisone 10 Mg Tabs (Prednisone) .... 3po Qd For 3days, Then 2po Qd For 3days, Then 1po Qd For 3days, Then Stop 8)  Proair Hfa 108 (90 Base) Mcg/act Aers (Albuterol Sulfate) .... 2 Puffs  Four Times Per Day As Needed For Wheezing or Shortness or Breath 9)  Tramadol Hcl 50 Mg Tabs (Tramadol Hcl) .Marland Kitchen.. 1po Q 6 Hrs As Needed 10)  Flexeril 5 Mg Tabs (Cyclobenzaprine Hcl) .Marland Kitchen.. 1po Three Times A Day As Needed Muscle Spasms  Allergies (verified): 1)  ! Reglan 2)  ! Zocor 3)  ! Claritin- D 4)  ! * Metoclopramide  Past History:  Past Medical History: Last updated: 12/16/2008 GERD chronic cough chronic tongue/mouth pain dyslipidemia Coronary artery disease Cerebrovascular accident, hx of - lacunar Allergic rhinitis Hypertension Asthma Hyperlipidemia  Past Surgical History: Last updated: 11/09/2006 s/p D&C  Social History: Last updated: 01/06/2009 Never Smoked Alcohol use-no Married  Risk Factors: Smoking Status: never (11/09/2006)  Review of Systems       all otherwise negative per pt -    Physical Exam  General:  alert and overweight-appearing.   Head:  normocephalic and atraumatic.   Eyes:  vision grossly intact, pupils equal, and pupils round.   Ears:  R ear normal and L ear normal.   Nose:  no external deformity and no nasal discharge.   Mouth:  no gingival abnormalities and pharynx pink and moist.   Neck:  supple and no masses.   Lungs:  normal respiratory effort and normal breath sounds.   Heart:  normal rate and regular rhythm.   Msk:  no joint tenderness and no joint swelling.  , no spine tender, has some mild tender to right buttock area at the SI joint area Extremities:  no edema, no erythema  Neurologic:  strength normal in all extremities, sensation intact to light touch, and DTRs symmetrical and normal.     Impression & Recommendations:  Problem # 1:  BACK PAIN (ICD-724.5)  Her updated medication list for this problem includes:    Aspir-low 81 Mg Tbec (Aspirin) .Marland Kitchen... 1 by mouth once daily    Tramadol Hcl 50 Mg Tabs (Tramadol hcl) .Marland Kitchen... 1po q 6 hrs as needed    Flexeril 5 Mg Tabs (Cyclobenzaprine hcl) .Marland Kitchen... 1po three times a day as  needed muscle spasms c/w radiculitis vs msk strain, treat as above, f/u any worsening signs or symptoms   Problem # 2:  SENILE DEMENTIA, UNCOMPLICATED (ICD-290.0) to increase the aricept to 10 mg as she tolerates the 5 mg well  Problem # 3:  HYPERTENSION (ICD-401.9)  BP today: 110/62 Prior BP: 130/90 (03/01/2009)  Prior 10 Yr Risk Heart Disease: N/A (04/04/2008)  Labs Reviewed: K+: 4.0 (01/21/2008) Creat: : 0.7 (01/21/2008)   Chol: 150 (06/29/2008)   HDL: 36.60 (06/29/2008)   LDL: 96 (03/05/2008)   TG: 223.0 (06/29/2008) stable overall by hx and exam, ok to continue meds/tx as is - no meds needed at  this time  Complete Medication List: 1)  Nexium 20 Mg Pack (Esomeprazole magnesium) .Marland Kitchen.. 1 by mouth once daily 2)  Fluticasone Propionate 50 Mcg/act Susp (Fluticasone propionate) .... 2 spray/side once daily 3)  Aricept 10 Mg Tabs (Donepezil hcl) .Marland Kitchen.. 1po once daily 4)  Magic Mouthwash  .... 5 cc by mouth swish and spit qid as needed 5)  Aspir-low 81 Mg Tbec (Aspirin) .Marland Kitchen.. 1 by mouth once daily 6)  Promethazine Hcl 25 Mg Tabs (Promethazine hcl) .Marland Kitchen.. 1 by mouth every 4 hours as needed for nausea and vomitting 7)  Prednisone 10 Mg Tabs (Prednisone) .... 3po qd for 3days, then 2po qd for 3days, then 1po qd for 3days, then stop 8)  Proair Hfa 108 (90 Base) Mcg/act Aers (Albuterol sulfate) .... 2 puffs four times per day as needed for wheezing or shortness or breath 9)  Tramadol Hcl 50 Mg Tabs (Tramadol hcl) .Marland Kitchen.. 1po q 6 hrs as needed 10)  Flexeril 5 Mg Tabs (Cyclobenzaprine hcl) .Marland Kitchen.. 1po three times a day as needed muscle spasms  Patient Instructions: 1)  Take an Aspirin every day - 81 mg - 1 per day - COATED only 2)  Please take all new medications as prescribed - the pain med, muscle relaxer and prednisone 3)  increase the aricept to 10 mg per day 4)  Continue all previous medications as before this visit  5)  Please schedule a follow-up appointment as needed. Prescriptions: PREDNISONE  10 MG TABS (PREDNISONE) 3po qd for 3days, then 2po qd for 3days, then 1po qd for 3days, then stop  #18 x 0   Entered and Authorized by:   Corwin Levins MD   Signed by:   Corwin Levins MD on 03/22/2009   Method used:   Electronically to        [facility] [address] [phone]* (retail)       [address]       [place], [place]  [address]       Ph: [phone] or [phone]       Fax: [phone]   RxID:   [id number] FLEXERIL 5 MG TABS (CYCLOBENZAPRINE HCL) 1po three times a day as needed muscle spasms  #60 x 1   Entered and Authorized by:   Corwin Levins MD   Signed by:   Corwin Levins MD on 03/22/2009   Method used:   [facility]        [facility] [address] [phone]* (retail)       [address]       [place], [place]  [address]       Ph: [phone] or [phone]       Fax: [phone]   RxID:   [id number] TRAMADOL HCL 50 MG TABS (TRAMADOL HCL) 1po q 6 hrs as needed  #60 x 1   Entered and Authorized by:   Corwin Levins MD   Signed by:   Corwin Levins MD on 03/22/2009   Method used:   [facility]        [facility] [address] [phone]* (retail)       [address]       [place], [place]  [address]       Ph: [phone] or [phone]       Fax: [phone]   RxID:  [id number] ARICEPT 10 MG TABS (DONEPEZIL HCL) 1po once daily  #30 x 11   Entered and Authorized by:   Corwin Levins MD   Signed by:   Corwin Levins MD on 03/22/2009   Method used:   [facility] to        [facility] [address] [phone]* (retail)       [address]       [place], [place]  [address]       Ph: [phone] or [phone]       Fax: [phone]   RxID:   [id number]

## 2010-02-01 NOTE — Letter (Signed)
Summary: [facility]   Imported By: Sherian Rein 01/07/2009 07:53:54  _____________________________________________________________________  External Attachment:    Type:   Image     Comment:   External Document

## 2010-02-01 NOTE — Assessment & Plan Note (Signed)
Summary: [name]-FOLLOW UP STILL WHEEZING-LB   Vital Signs:  Patient profile:   77 year old female Height:      62 inches Weight:      160.50 pounds BMI:     29.46 O2 Sat:      95 % on Room air Temp:     97.4 degrees F oral Pulse rate:   89 / minute BP sitting:   130 / 90  (left arm) Cuff size:   regular  Vitals Entered ByZella Ball Ewing (March 01, 2009 10:57 AM)  O2 Flow:  Room air CC: Wheezing, Right hip pain/RE   Primary Care Provider:  Corwin Levins MD  CC:  Wheezing and Right hip pain/RE.  History of Present Illness: here with neighbor friend who checks on her every other day ; Jessica with dementia and hx unreliable now;  saw dr Felicity Coyer last wk , had steroid shot in the office, did better for the next 1 -2 days, but then wheezing seemed to gradually worsen again with non prod cough;  no fever, headache, malaise and n/v seemed to have resolved;  no diarrhea or blood.  Does have some epigastric tender after increased cough in the past 2 days;  good complaicne with the nexium., alhtough has taken goody powders on multiple occasions in the past wk with onset of a 'catch" in the right lower back - pain to the right lateral lumbar area, aching, intermittent without bowel or bladder changes, or worsening LE pain, weak or numbness.  .  No fever, chills, or other CP, sob.  Has some increased constipation recently as well.    Problems Prior to Update: 1)  Epigastric Tenderness  (ICD-789.66) 2)  Nausea With Vomiting  (ICD-787.01) 3)  Asthma Unspecified With Exacerbation  (ICD-493.92) 4)  Fatigue  (ICD-780.79) 5)  Senile Dementia, Uncomplicated  (ICD-290.0) 6)  Memory Loss  (ICD-780.93) 7)  Wheezing  (ICD-786.07) 8)  Cough Variant Asthma  (ICD-493.82) 9)  Allergic Rhinitis  (ICD-477.9) 10)  Blurred Vision  (ICD-368.8) 11)  Knee Pain, Left  (ICD-719.46) 12)  Memory Loss  (ICD-780.93) 13)  Thrush  (ICD-112.0) 14)  Throat Pain  (ICD-784.1) 15)  Fatigue   (ICD-780.79) 16)  Asthmatic Bronchitis, Acute  (ICD-466.0) 17)  Chest Pain  (ICD-786.50) 18)  Glossitis  (ICD-529.0) 19)  Asthmatic Bronchitis, Acute  (ICD-466.0) 20)  Hepatotoxicity, Drug-induced, Risk of  (ICD-V58.69) 21)  Hyperlipidemia  (ICD-272.4) 22)  Insomnia-sleep Disorder-unspec  (ICD-307.40) 23)  Glossitis  (ICD-529.0) 24)  Uri  (ICD-465.9) 25)  Family History of Cad Female 1st Degree Relative <50  (ICD-V17.3) 26)  Family History of Cad Female 1st Degree Relative <60  (ICD-V16.49) 27)  Asthma  (ICD-493.90) 28)  Hypertension  (ICD-401.9) 29)  Allergic Rhinitis  (ICD-477.9) 30)  Cerebrovascular Accident, Hx of  (ICD-V12.50) 31)  Coronary Artery Disease  (ICD-414.00) 32)  Gerd  (ICD-530.81)  Medications Prior to Update: 1)  Nexium 20 Mg Pack (Esomeprazole Magnesium) .Marland Kitchen.. 1 By Mouth Once Daily 2)  Fluticasone Propionate 50 Mcg/act Susp (Fluticasone Propionate) .... 2 Spray/side Once Daily 3)  Aricept 5 Mg Tabs (Donepezil Hcl) .Marland Kitchen.. 1po Once Daily 4)  Magic Mouthwash .... 5 Cc By Mouth Swish and Spit Qid As Needed 5)  Aspir-Low 81 Mg Tbec (Aspirin) .Marland Kitchen.. 1 By Mouth Once Daily 6)  Promethazine Hcl 25 Mg Tabs (Promethazine Hcl) .Marland Kitchen.. 1 By Mouth Every 4 Hours As Needed For Nausea and Vomitting  Current Medications (verified): 1)  Nexium  20 Mg Pack (Esomeprazole Magnesium) .Marland Kitchen.. 1 By Mouth Once Daily 2)  Fluticasone Propionate 50 Mcg/act Susp (Fluticasone Propionate) .... 2 Spray/side Once Daily 3)  Aricept 5 Mg Tabs (Donepezil Hcl) .Marland Kitchen.. 1po Once Daily 4)  Magic Mouthwash .... 5 Cc By Mouth Swish and Spit Qid As Needed 5)  Aspir-Low 81 Mg Tbec (Aspirin) .Marland Kitchen.. 1 By Mouth Once Daily 6)  Promethazine Hcl 25 Mg Tabs (Promethazine Hcl) .Marland Kitchen.. 1 By Mouth Every 4 Hours As Needed For Nausea and Vomitting 7)  Prednisone 10 Mg Tabs (Prednisone) .... 3po Qd For 3days, Then 2po Qd For 3days, Then 1po Qd For 3days, Then Stop 8)  Hydrocodone-Homatropine 5-1.5 Mg/24ml Syrp (Hydrocodone-Homatropine) .Marland Kitchen.. 1  Tsp By Mouth Q 6 Hrs As Needed Cough 9)  Proair Hfa 108 (90 Base) Mcg/act Aers (Albuterol Sulfate) .... 2 Puffs Four Times Per Day As Needed For Wheezing or Shortness or Breath  Allergies (verified): 1)  ! Reglan 2)  ! Zocor 3)  ! Claritin- D 4)  ! * Metoclopramide  Past History:  Past Medical History: Last updated: 12/16/2008 GERD chronic cough chronic tongue/mouth pain dyslipidemia Coronary artery disease Cerebrovascular accident, hx of - lacunar Allergic rhinitis Hypertension Asthma Hyperlipidemia  Past Surgical History: Last updated: 11/09/2006 s/p D&C  Social History: Last updated: 01/06/2009 Never Smoked Alcohol use-no Married  Risk Factors: Smoking Status: never (11/09/2006)  Review of Systems       all otherwise negative per Jessica -   Physical Exam  General:  alert and overweight-appearing., mild ill  Head:  normocephalic and atraumatic.   Eyes:  vision grossly intact, pupils equal, and pupils round.   Ears:  bilat tm's red, sinus nontender Nose:  nasal dischargemucosal pallor and mucosal edema.   Mouth:  pharyngeal erythema and fair dentition.   Neck:  supple and cervical lymphadenopathy.   Lungs:  normal respiratory effort, R decreased breath sounds, R wheezes, L decreased breath sounds, and L wheezes.   Heart:  normal rate and regular rhythm.   Abdomen:  soft, normal bowel sounds, no distention, no masses, and no guarding.  but has mild epigatric tender  Msk:  mild right lumbar paravetebral tender, no spasm Extremities:  no edema, no erythema  Neurologic:  cranial nerves II-XII intact and strength normal in lower extremities.     Impression & Recommendations:  Problem # 1:  ASTHMA UNSPECIFIED WITH EXACERBATION (ICD-493.92)  for depo shot repeat today, and prednisone burst and taper off, cough meds as needed   Her updated medication list for this problem includes:    Prednisone 10 Mg Tabs (Prednisone) .Marland Kitchen... 3po qd for 3days, then 2po qd for  3days, then 1po qd for 3days, then stop    Proair Hfa 108 (90 Base) Mcg/act Aers (Albuterol sulfate) .Marland Kitchen... 2 puffs four times per day as needed for wheezing or shortness or breath  Problem # 2:  EPIGASTRIC TENDERNESS (ICD-789.66) mild, suspect msk due to cough; for cough med as above, advised to no longer take BC powders, cont PPI, consider further w/u of discomfort persists or worsens  Problem # 3:  HYPERTENSION (ICD-401.9)  BP today: 130/90 Prior BP: 110/64 (02/22/2009)  Prior 10 Yr Risk Heart Disease: N/A (04/04/2008)  Labs Reviewed: K+: 4.0 (01/21/2008) Creat: : 0.7 (01/21/2008)   Chol: 150 (06/29/2008)   HDL: 36.60 (06/29/2008)   LDL: 96 (03/05/2008)   TG: 223.0 (06/29/2008) stable overall by hx and exam, ok to continue meds/tx as is - no meds needed at this time at  her current wt (down from peak wt)  Problem # 4:  CONSTIPATION (ICD-564.00) for miralax as needed   Problem # 5:  BACK PAIN (ICD-724.5)  Her updated medication list for this problem includes:    Aspir-low 81 Mg Tbec (Aspirin) .Marland Kitchen... 1 by mouth once daily for tylenol as needed   Complete Medication List: 1)  Nexium 20 Mg Pack (Esomeprazole magnesium) .Marland Kitchen.. 1 by mouth once daily 2)  Fluticasone Propionate 50 Mcg/act Susp (Fluticasone propionate) .... 2 spray/side once daily 3)  Aricept 5 Mg Tabs (Donepezil hcl) .Marland Kitchen.. 1po once daily 4)  Magic Mouthwash  .... 5 cc by mouth swish and spit qid as needed 5)  Aspir-low 81 Mg Tbec (Aspirin) .Marland Kitchen.. 1 by mouth once daily 6)  Promethazine Hcl 25 Mg Tabs (Promethazine hcl) .Marland Kitchen.. 1 by mouth every 4 hours as needed for nausea and vomitting 7)  Prednisone 10 Mg Tabs (Prednisone) .... 3po qd for 3days, then 2po qd for 3days, then 1po qd for 3days, then stop 8)  Hydrocodone-homatropine 5-1.5 Mg/62ml Syrp (Hydrocodone-homatropine) .Marland Kitchen.. 1 tsp by mouth q 6 hrs as needed cough 9)  Proair Hfa 108 (90 Base) Mcg/act Aers (Albuterol sulfate) .... 2 puffs four times per day as needed for wheezing  or shortness or breath  Other Orders: Depo- Medrol 40mg  (J1030) Depo- Medrol 80mg  (J1040) Admin of Therapeutic Inj  intramuscular or subcutaneous (16109)   Patient Instructions: 1)  you had the steroid shot today 2)  Please take all new medications as prescribed - the prednisone, and the cough medicine, and the inhaler as needed  3)  please do not take BC powders in the future 4)  you can take tylenol 8 hr - 1-2 every 8 hrs for pain as needed  5)  you can also take Miralax OTC every day for the constipation 6)  Please schedule a follow-up appointment in 5 months for your "yearly exam" or sooner if needed Prescriptions: PROAIR HFA 108 (90 BASE) MCG/ACT AERS (ALBUTEROL SULFATE) 2 puffs four times per day as needed for wheezing or shortness or breath  #1 x 11   Entered and Authorized by:   Corwin Levins MD   Signed by:   Corwin Levins MD on 03/01/2009   Method used:   Print then Give to Patient   RxID:   [id number] HYDROCODONE-HOMATROPINE 5-1.5 MG/5ML SYRP (HYDROCODONE-HOMATROPINE) 1 tsp by mouth q 6 hrs as needed cough  #6oz x 1   Entered and Authorized by:   Corwin Levins MD   Signed by:   Corwin Levins MD on 03/01/2009   Method used:   Print then Give to Patient   RxID:   [id number] PREDNISONE 10 MG TABS (PREDNISONE) 3po qd for 3days, then 2po qd for 3days, then 1po qd for 3days, then stop  #18 x 0   Entered and Authorized by:   Corwin Levins MD   Signed by:   Corwin Levins MD on 03/01/2009   Method used:   Print then Give to Patient   RxID:   [id number]    Medication Administration  Injection # 1:    Medication: Depo- Medrol 40mg     Diagnosis: WHEEZING (ICD-786.07)    Route: IM    Site: LUOQ gluteus    Exp Date: 10/2009    Lot #: [id number]    Mfr: Teva    Given by: Zella Ball Ewing (March 01, 2009 11:33 AM)  Injection # 2:    Medication: Depo- Medrol  80mg     Diagnosis: WHEEZING (ICD-786.07)    Route: IM    Site: LUOQ gluteus    Exp Date: 10/2009    Lot  #: [id number]    Mfr: Teva    Given by: Zella Ball Ewing (March 01, 2009 11:33 AM)  Orders Added: 1)  Depo- Medrol 40mg  [J1030] 2)  Depo- Medrol 80mg  [J1040] 3)  Admin of Therapeutic Inj  intramuscular or subcutaneous [96372] 4)  Est. Patient Level IV [54098]

## 2010-02-01 NOTE — Assessment & Plan Note (Signed)
Summary: asthma acting up/[name]/cd   Vital Signs:  Patient profile:   77 year old female Height:      62 inches (157.48 cm) Weight:      163 pounds (74.09 kg) O2 Sat:      92 % on Room air Temp:     96.6 degrees F (35.89 degrees C) oral Pulse rate:   97 / minute BP sitting:   110 / 64  (left arm) Cuff size:   regular  Vitals Entered By: Orlan Leavens (February 22, 2009 1:49 PM)  O2 Flow:  Room air CC: Asthma. Been coughing since Friday, wheezing, no appetite/ Vomitting. also want to discuss urine test that was done on 01/25/09 Is Patient Diabetic? No Pain Assessment Patient in pain? no        Primary Care Provider:  Corwin Levins MD  CC:  Asthma. Been coughing since Friday, wheezing, and no appetite/ Vomitting. also want to discuss urine test that was done on 01/25/09.  History of Present Illness: here today with complaint of coughing and wheeze. onset of symptoms was 4 days ago. course has been sudden onset and now occurs in constant daily pattern. problem precipitated by +sick contacts symptom characterized as rattling in chest but no sputum out - problem associated with nausea, no appetite and heaves of OTC cough syrups but not associated with fever, abd pain, diarrhea or HA - no worsening of confusion . symptoms improved by nothing - OTC meds won;t stay down. symptoms worsened with any activity - feels best when lying down and still. no prior hx of same symptoms in recent months.   Current Medications (verified): 1)  Nexium 20 Mg Pack (Esomeprazole Magnesium) .Marland Kitchen.. 1 By Mouth Once Daily 2)  Fluticasone Propionate 50 Mcg/act Susp (Fluticasone Propionate) .... 2 Spray/side Once Daily 3)  Aricept 5 Mg Tabs (Donepezil Hcl) .Marland Kitchen.. 1po Once Daily 4)  Magic Mouthwash .... 5 Cc By Mouth Swish and Spit Qid As Needed 5)  Aspir-Low 81 Mg Tbec (Aspirin) .Marland Kitchen.. 1 By Mouth Once Daily  Allergies (verified): 1)  ! Reglan 2)  ! Zocor 3)  ! Claritin- D 4)  ! * Metoclopramide  Past  History:  Past Medical History: Reviewed history from 12/16/2008 and no changes required. GERD chronic cough chronic tongue/mouth pain dyslipidemia Coronary artery disease Cerebrovascular accident, hx of - lacunar Allergic rhinitis Hypertension Asthma Hyperlipidemia  Review of Systems       The patient complains of anorexia, dyspnea on exertion, and prolonged cough.  The patient denies fever, weight loss, hoarseness, chest pain, syncope, peripheral edema, and abdominal pain.    Physical Exam  General:  alert, well-developed, well-nourished, and cooperative to examination.   mild-mod ill, heaving (brought her own trash can from home for possible vomitting) -dtr at side Eyes:  vision grossly intact; pupils equal, round and reactive to light.  conjunctiva and lids normal.    Ears:  normal pinnae bilaterally, without erythema, swelling, or tenderness to palpation. TMs clear, without effusion, or cerumen impaction. Hearing grossly normal bilaterally  Mouth:  teeth and gums in good repair; mucous membranes moist, without lesions or ulcers. oropharynx clear without exudate, min erythema.  Lungs:  normal respiratory effort, no intercostal retractions or use of accessory muscles;  breath sounds bilaterally with diffuse exp wheezes, no apparent crackles.    Heart:  normal rate, regular rhythm, no murmur, and no rub. BLE without edema.  Abdomen:  soft, non-tender, normal bowel sounds, no distention; no masses and no  appreciable hepatomegaly or splenomegaly.     Impression & Recommendations:  Problem # 1:  ASTHMA UNSPECIFIED WITH EXACERBATION (ICD-493.92)  +viral GI bug, known lung dz and constant exposure to second hand smoke - give steroid shot now - hold emperic abx given probable viral etiology and afeb  Pulmonary Functions Reviewed: O2 sat: 92 (02/22/2009)  Orders: Depo- Medrol 80mg  (J1040) Admin of Therapeutic Inj  intramuscular or subcutaneous (02542)  Problem # 2:  NAUSEA  WITH VOMITING (ICD-787.01)  phenergan IM now and by mouth rx to use as needed at home exam benign -  probable GI viral infx -hough no diarrhea - encouraged bland diet as tol, call if worsening symptoms   Orders: Prescription Created Electronically [phone]) Promethazine up to 50mg  (J2550)  Complete Medication List: 1)  Nexium 20 Mg Pack (Esomeprazole magnesium) .Marland Kitchen.. 1 by mouth once daily 2)  Fluticasone Propionate 50 Mcg/act Susp (Fluticasone propionate) .... 2 spray/side once daily 3)  Aricept 5 Mg Tabs (Donepezil hcl) .Marland Kitchen.. 1po once daily 4)  Magic Mouthwash  .... 5 cc by mouth swish and spit qid as needed 5)  Aspir-low 81 Mg Tbec (Aspirin) .Marland Kitchen.. 1 by mouth once daily 6)  Promethazine Hcl 25 Mg Tabs (Promethazine hcl) .Marland Kitchen.. 1 by mouth every 4 hours as needed for nausea and vomitting  Patient Instructions: 1)  it was good to see you today. 2)  2 shots today - one for wheezing and one for nausea and vomitting - 3)  Drink clear liquids only for the next 24 hours, then slowly add other liquids and food as you  tolerate them - toast, rice, mashed potatoes, etc 4)  continue phenergan as needed for nausea and vomitting - your prescription has been electronically submitted to your pharmacy. Please take as directed. Contact our office if you believe you're having problems with the medication(s).  5)  fou cough, use robitussin or mucinex as needed  6)  Get plenty of rest, drink lots of clear liquids, and use Tylenol or Ibuprofen for fever and comfort. Return in 7-10 days if you're not better:sooner if you're feeling worse. Prescriptions: PROMETHAZINE HCL 25 MG TABS (PROMETHAZINE HCL) 1 by mouth every 4 hours as needed for nausea and vomitting  #30 x 0   Entered and Authorized by:   Newt Lukes MD   Signed by:   Newt Lukes MD on 02/22/2009   Method used:   [facility]        [facility] [address] [phone]* (retail)       [address]       [place], [place]  [address]       Ph: [phone] or [phone]       Fax: [phone]   RxID:   [id number]    Medication Administration  Injection # 1:    Medication: Depo- Medrol 80mg     Diagnosis: ASTHMA UNSPECIFIED WITH EXACERBATION [phone])    Route: IM    Site: RUOQ gluteus    Exp Date: 09/2011    Lot #: [id number]    Mfr: Pharmacia    Patient tolerated injection without complications    Given by: Orlan Leavens (February 22, 2009 2:23 PM)  Injection # 2:    Medication: Promethazine up to 50mg     Diagnosis: NAUSEA WITH VOMITING (ICD-787.01)    Route: IM    Site: LUOQ gluteus    Exp Date: 09/2010    Lot #: [id number]  Mfr: [email or web address]    Patient tolerated injection without complications    Given by: Orlan Leavens (February 22, 2009 2:23 PM)  Orders Added: 1)  Est. Patient Level IV [99214] 2)  Prescription Created Electronically [phone] 3)  Depo- Medrol 80mg  [J1040] 4)  Promethazine up to 50mg  [J2550] 5)  Admin of Therapeutic Inj  intramuscular or subcutaneous [88416]

## 2010-02-01 NOTE — Progress Notes (Signed)
Summary: [employer]  Phone Note Outgoing Call   Call placed by: Lanier Prude, [facility]),  October 01, 2009 4:21 PM Summary of Call: faxed form to [employer] for ins verification....will wait for decision  Follow-up for Phone Call        received fax stating there will be no out of pocket for  [employer]. Pt informed.......Marland KitchenWill order [employer] ans sched pt 1 week out. Follow-up by: Lanier Prude, [facility]),  October 06, 2009 8:58 AM  Additional Follow-up for Phone Call Additional follow up Details #1::        noted Additional Follow-up by: Corwin Levins MD,  October 06, 2009 10:17 AM

## 2010-02-01 NOTE — Progress Notes (Signed)
Summary: hydrocodone  Phone Note Refill Request Message from:  Fax from Pharmacy on July 29, 2009 10:06 AM  Refills Requested: Medication #1:  Hydrocodone-homatropine syrup take 1 teaspoon po q 6 hours prn   [facility] [place] Last ov 07/26/09 Is this ok to refill ?  Next Appointment Scheduled: 01/31/10 Initial call taken by: Orlan Leavens RMA,  July 29, 2009 10:08 AM  Follow-up for Phone Call        no, pt did not have cough at last OV Follow-up by: Corwin Levins MD,  July 29, 2009 12:33 PM  Additional Follow-up for Phone Call Additional follow up Details #1::        Faxed back paper request to pharm. Rx denied need ov for refill Additional Follow-up by: Orlan Leavens RMA,  July 29, 2009 12:54 PM

## 2010-02-01 NOTE — Assessment & Plan Note (Signed)
Summary: CHEST CONGESTION/OFFERED SDA -REFUSED/CD   Vital Signs:  Patient profile:   77 year old female Height:      62 inches Weight:      152.13 pounds BMI:     27.93 O2 Sat:      94 % on Room air Temp:     98.3 degrees F oral Pulse rate:   78 / minute BP sitting:   110 / 72  (left arm) Cuff size:   regular  Vitals Entered By: Zella Ball Ewing CMA (AAMA) (August 02, 2009 10:50 AM)  O2 Flow:  Room air CC: congestion, cough, eye draining/RE   Primary Care Provider:  Corwin Levins MD  CC:  congestion, cough, and eye draining/RE.  History of Present Illness: here with acute on chronic recurring cough non prod but feels as if "something is ticking and congested in the back of the throat" ;  also with new sinus congestion, weepy eyes and feeling tired and worn out  - new from last vist;  denies high fever, ST, production to the cough, and Pt denies CP, sob, doe, wheezing, orthopnea, pnd, worsening LE edema, palps, dizziness or syncope.  Pt denies new neuro symptoms such as headache, facial or extremity weakness  Has seen Dr Little/card approx 2 mo ago - ECG then was "ok" per daughter with her today.No fever, wt loss, night sweats, loss of appetite or other constitutional symptoms   Is tolerating and compliact with med for UTI.  Unfort has also developed rash under the breasts bilat with some itchiness.  Overall good compalicne and tolerance with usual meds including the statin, except not using the flonase much.    Problems Prior to Update: 1)  Preventive Health Care  (ICD-V70.0) 2)  Degenerative Disc Disease, Lumbosacral Spine  (ICD-722.52) 3)  Menopausal Disorder  (ICD-627.9) 4)  Back Pain  (ICD-724.5) 5)  Constipation  (ICD-564.00) 6)  Epigastric Tenderness  (ICD-789.66) 7)  Nausea With Vomiting  (ICD-787.01) 8)  Asthma Unspecified With Exacerbation  (ICD-493.92) 9)  Fatigue  (ICD-780.79) 10)  Senile Dementia, Uncomplicated  (ICD-290.0) 11)  Memory Loss  (ICD-780.93) 12)  Wheezing   (ICD-786.07) 13)  Cough Variant Asthma  (ICD-493.82) 14)  Allergic Rhinitis  (ICD-477.9) 15)  Blurred Vision  (ICD-368.8) 16)  Knee Pain, Left  (ICD-719.46) 17)  Memory Loss  (ICD-780.93) 18)  Thrush  (ICD-112.0) 19)  Throat Pain  (ICD-784.1) 20)  Fatigue  (ICD-780.79) 21)  Asthmatic Bronchitis, Acute  (ICD-466.0) 22)  Chest Pain  (ICD-786.50) 23)  Glossitis  (ICD-529.0) 24)  Asthmatic Bronchitis, Acute  (ICD-466.0) 25)  Hepatotoxicity, Drug-induced, Risk of  (ICD-V58.69) 26)  Hyperlipidemia  (ICD-272.4) 27)  Insomnia-sleep Disorder-unspec  (ICD-307.40) 28)  Glossitis  (ICD-529.0) 29)  Uri  (ICD-465.9) 30)  Family History of Cad Female 1st Degree Relative <50  (ICD-V17.3) 31)  Family History of Cad Female 1st Degree Relative <60  (ICD-V16.49) 32)  Asthma  (ICD-493.90) 33)  Hypertension  (ICD-401.9) 34)  Allergic Rhinitis  (ICD-477.9) 35)  Cerebrovascular Accident, Hx of  (ICD-V12.50) 36)  Coronary Artery Disease  (ICD-414.00) 37)  Gerd  (ICD-530.81)  Medications Prior to Update: 1)  Nexium 20 Mg Pack (Esomeprazole Magnesium) .Marland Kitchen.. 1 By Mouth Once Daily 2)  Fluticasone Propionate 50 Mcg/act Susp (Fluticasone Propionate) .... 2 Spray/side Once Daily 3)  Aricept 10 Mg Tabs (Donepezil Hcl) .Marland Kitchen.. 1po Once Daily 4)  Aspir-Low 81 Mg Tbec (Aspirin) .Marland Kitchen.. 1 By Mouth Once Daily 5)  Proair Hfa 108 (90 Base)  Mcg/act Aers (Albuterol Sulfate) .... 2 Puffs Four Times Per Day As Needed For Wheezing or Shortness or Breath 6)  Flexeril 5 Mg Tabs (Cyclobenzaprine Hcl) .Marland Kitchen.. 1po Three Times A Day As Needed Muscle Spasms 7)  Tramadol-Acetaminophen 37.5-325 Mg Tabs (Tramadol-Acetaminophen) .Marland Kitchen.. 1 By Mouth Every 6 Hours As Needed For Pain 8)  Namenda 5 Mg Tabs (Memantine Hcl) .Marland Kitchen.. 1 By Mouth Once Daily 9)  Fexofenadine Hcl 180 Mg Tabs (Fexofenadine Hcl) .Marland Kitchen.. 1po Once Daily As Needed Allergies 10)  Pravastatin Sodium 40 Mg Tabs (Pravastatin Sodium) .Marland Kitchen.. 1po Once Daily 11)  Nitrofurantoin Macrocrystal 100 Mg  Caps (Nitrofurantoin Macrocrystal) .Marland Kitchen.. 1po Two Times A Day  Current Medications (verified): 1)  Nexium 20 Mg Pack (Esomeprazole Magnesium) .Marland Kitchen.. 1 By Mouth Once Daily 2)  Fluticasone Propionate 50 Mcg/act Susp (Fluticasone Propionate) .... 2 Spray/side Once Daily 3)  Aricept 10 Mg Tabs (Donepezil Hcl) .Marland Kitchen.. 1po Once Daily 4)  Aspir-Low 81 Mg Tbec (Aspirin) .Marland Kitchen.. 1 By Mouth Once Daily 5)  Proair Hfa 108 (90 Base) Mcg/act Aers (Albuterol Sulfate) .... 2 Puffs Four Times Per Day As Needed For Wheezing or Shortness or Breath 6)  Flexeril 5 Mg Tabs (Cyclobenzaprine Hcl) .Marland Kitchen.. 1po Three Times A Day As Needed Muscle Spasms 7)  Tramadol-Acetaminophen 37.5-325 Mg Tabs (Tramadol-Acetaminophen) .Marland Kitchen.. 1 By Mouth Every 6 Hours As Needed For Pain 8)  Namenda 5 Mg Tabs (Memantine Hcl) .Marland Kitchen.. 1 By Mouth Once Daily 9)  Fexofenadine Hcl 180 Mg Tabs (Fexofenadine Hcl) .Marland Kitchen.. 1po Once Daily As Needed Allergies 10)  Pravastatin Sodium 40 Mg Tabs (Pravastatin Sodium) .Marland Kitchen.. 1po Once Daily 11)  Nitrofurantoin Macrocrystal 100 Mg Caps (Nitrofurantoin Macrocrystal) .Marland Kitchen.. 1po Two Times A Day  Allergies (verified): 1)  ! Reglan 2)  ! Zocor 3)  ! Claritin- D 4)  ! * Metoclopramide  Past History:  Past Medical History: Last updated: 07/26/2009 GERD chronic cough    chronic tongue/mouth pain dyslipidemia Coronary artery disease Cerebrovascular accident, hx of - lacunar Allergic rhinitis Hypertension Asthma Hyperlipidemia    MD roster:  Dr Ewing Schlein - GI                    optho - not sure of name                   cards - Dr Clarene Duke                 Past Surgical History: Last updated: 11/09/2006 s/p D&C  Social History: Last updated: 07/26/2009 Never Smoked Alcohol use-no Married Drug use-no 3 children - 2 died in separate car crash  Risk Factors: Smoking Status: never (11/09/2006)  Review of Systems       all otherwise negative per pt -    Physical Exam  General:  alert and overweight-appearing. ,  mild ill  Head:  normocephalic and atraumatic.   Eyes:  vision grossly intact, pupils equal, and pupils round.   Ears:  bilat conjunct erythema/injection with mild d/c - ? slightly cloudy; bilat tm's with mild erythema, canals clear Nose:  nasal dischargemucosal pallor and mucosal edema.   Mouth:  pharyngeal erythema and fair dentition.   Neck:  supple and no masses.   Lungs:  normal respiratory effort, R decreased breath sounds, and L decreased breath sounds.  but no rales or wheezing Heart:  normal rate and regular rhythm.  but with freq ectopy Abdomen:  soft, non-tender, and normal bowel sounds.   Extremities:  no edema, no  erythema  Skin:  color normal.  with infra-breast rash noted c/w candida Psych:  not depressed appearing and moderately anxious.     Impression & Recommendations:  Problem # 1:  URI (ICD-465.9)  Her updated medication list for this problem includes:    Aspir-low 81 Mg Tbec (Aspirin) .Marland Kitchen... 1 by mouth once daily    Fexofenadine Hcl 180 Mg Tabs (Fexofenadine hcl) .Marland Kitchen... 1po once daily as needed allergies    Hydrocodone-homatropine 5-1.5 Mg/66ml Syrp (Hydrocodone-homatropine) .Marland Kitchen... 1 tsp by mouth q 6 hrs as needed cough    Tessalon Perles 100 Mg Caps (Benzonatate) .Marland Kitchen... 1-2 by mouth three times a day as needed for cough Continue all previous medications as before this visit, except tx with zpack as well;   Problem # 2:  COUGH VARIANT ASTHMA (ICD-493.82)  Her updated medication list for this problem includes:    Proair Hfa 108 (90 Base) Mcg/act Aers (Albuterol sulfate) .Marland Kitchen... 2 puffs four times per day as needed for wheezing or shortness or breath Continue all previous medications as before this visit, no wheezing today, for cxr to r/o subclinical pna, ok for cough med as well   Orders: T-2 View CXR, Same Day (71020.5TC)  Problem # 3:  HYPERTENSION (ICD-401.9)  BP today: 110/72 Prior BP: 112/70 (07/26/2009)  Prior 10 Yr Risk Heart Disease: N/A  (04/04/2008)  Labs Reviewed: K+: 4.7 (07/26/2009) Creat: : 0.7 (07/26/2009)   Chol: 238 (07/26/2009)   HDL: 38.30 (07/26/2009)   LDL: 96 (03/05/2008)   TG: 257.0 (07/26/2009)  Orders: EKG w/ Interpretation (93000) stable overall by hx and exam, ok to continue meds/tx as is  - ok to cont meds as is  Problem # 4:  RASH-NONVESICULAR (ICD-782.1)  Her updated medication list for this problem includes:    Nystatin Powd (Nystatin) ..... Use asd two times a day as needed to affected area treat as above, f/u any worsening signs or symptoms   Complete Medication List: 1)  Nexium 20 Mg Pack (Esomeprazole magnesium) .Marland Kitchen.. 1 by mouth once daily 2)  Fluticasone Propionate 50 Mcg/act Susp (Fluticasone propionate) .... 2 spray/side once daily 3)  Aricept 10 Mg Tabs (Donepezil hcl) .Marland Kitchen.. 1po once daily 4)  Aspir-low 81 Mg Tbec (Aspirin) .Marland Kitchen.. 1 by mouth once daily 5)  Proair Hfa 108 (90 Base) Mcg/act Aers (Albuterol sulfate) .... 2 puffs four times per day as needed for wheezing or shortness or breath 6)  Flexeril 5 Mg Tabs (Cyclobenzaprine hcl) .Marland Kitchen.. 1po three times a day as needed muscle spasms 7)  Tramadol-acetaminophen 37.5-325 Mg Tabs (Tramadol-acetaminophen) .Marland Kitchen.. 1 by mouth every 6 hours as needed for pain 8)  Namenda 5 Mg Tabs (Memantine hcl) .Marland Kitchen.. 1 by mouth once daily 9)  Fexofenadine Hcl 180 Mg Tabs (Fexofenadine hcl) .Marland Kitchen.. 1po once daily as needed allergies 10)  Pravastatin Sodium 40 Mg Tabs (Pravastatin sodium) .Marland Kitchen.. 1po once daily 11)  Nitrofurantoin Macrocrystal 100 Mg Caps (Nitrofurantoin macrocrystal) .Marland Kitchen.. 1po two times a day 12)  Azithromycin 250 Mg Tabs (Azithromycin) .... 2po qd for 1 day, then 1po qd for 4days, then stop 13)  Hydrocodone-homatropine 5-1.5 Mg/8ml Syrp (Hydrocodone-homatropine) .Marland Kitchen.. 1 tsp by mouth q 6 hrs as needed cough 14)  Tessalon Perles 100 Mg Caps (Benzonatate) .Marland Kitchen.. 1-2 by mouth three times a day as needed for cough 15)  Nystatin Powd (Nystatin) .... Use asd two times  a day as needed to affected area  Patient Instructions: 1)  Please take all new medications as prescribed - the antibiotic,  the cough syrup and the cough pills as needed;  and the powder for the rash 2)  Continue all previous medications as before this visit  3)  The EKG showed Normal rhythm with a few extra beats only 4)  Please go to Radiology in the basement level for your X-Ray today  5)  Please schedule a follow-up appointment as needed. Prescriptions: NYSTATIN  POWD (NYSTATIN) use asd two times a day as needed to affected area  #1 x 1   Entered and Authorized by:   Corwin Levins MD   Signed by:   Corwin Levins MD on 08/02/2009   Method used:   Print then Give to Patient   RxID:   [id number] TESSALON PERLES 100 MG CAPS (BENZONATATE) 1-2 by mouth three times a day as needed for cough  #90 x 1   Entered and Authorized by:   Corwin Levins MD   Signed by:   Corwin Levins MD on 08/02/2009   Method used:   Print then Give to Patient   RxID:   [id number] HYDROCODONE-HOMATROPINE 5-1.5 MG/5ML SYRP (HYDROCODONE-HOMATROPINE) 1 tsp by mouth q 6 hrs as needed cough  #6 oz x 1   Entered and Authorized by:   Corwin Levins MD   Signed by:   Corwin Levins MD on 08/02/2009   Method used:   Print then Give to Patient   RxID:   [id number] AZITHROMYCIN 250 MG TABS (AZITHROMYCIN) 2po qd for 1 day, then 1po qd for 4days, then stop  #6 x 1   Entered and Authorized by:   Corwin Levins MD   Signed by:   Corwin Levins MD on 08/02/2009   Method used:   Print then Give to Patient   RxID:   [id number]

## 2010-02-01 NOTE — Progress Notes (Signed)
Summary: REFILL  Phone Note Call from Patient   Reason for Call: Refill Medication Summary of Call: NEEDS REFILL ON ACETAMINOPHIN-TRAMADOL  Initial call taken by: Migdalia Dk,  October 13, 2009 1:51 PM  Follow-up for Phone Call        done per emr Follow-up by: Corwin Levins MD,  October 13, 2009 2:07 PM    Prescriptions: TRAMADOL-ACETAMINOPHEN 37.5-325 MG TABS (TRAMADOL-ACETAMINOPHEN) 1 by mouth every 6 hours as needed for pain  #60 x 2   Entered and Authorized by:   Corwin Levins MD   Signed by:   Corwin Levins MD on 10/13/2009   Method used:   [facility]        [facility] [address] [phone]* (retail)       [address]       [place], [place]  [address]       Ph: [phone] or [phone]       Fax: [phone]   RxID:   [id number] TRAMADOL-ACETAMINOPHEN 37.5-325 MG TABS (TRAMADOL-ACETAMINOPHEN) 1 by mouth every 6 hours as needed for pain  #60 x 2   Entered and Authorized by:   Corwin Levins MD   Signed by:   Corwin Levins MD on 10/13/2009   Method used:   Print then Give to Patient   RxID:   [id number]  done hardcopy to LIM side B - dahlia  Corwin Levins MD  October 13, 2009 2:07 PM

## 2010-02-01 NOTE — Assessment & Plan Note (Signed)
Summary: DR Sammuel Cooper PT[facility]--BACK PAIN  STC   Vital Signs:  Patient profile:   77 year old female Height:      62 inches (157.48 cm) Weight:      162.8 pounds (74 kg) O2 Sat:      96 % on Room air Temp:     97.0 degrees F (36.11 degrees C) oral Pulse rate:   59 / minute BP sitting:   120 / 62  (left arm) Cuff size:   regular  Vitals Entered By: Orlan Leavens (May 21, 2009 11:17 AM)  O2 Flow:  Room air CC: (R) back & hip pain, also want to discuss increasing mg on aricept, Back pain Is Patient Diabetic? No Pain Assessment Patient in pain? yes     Location: back and hip pain Type: aching   Primary Care Provider:  Corwin Levins MD  CC:  (R) back & hip pain, also want to discuss increasing mg on aricept, and Back pain.  History of Present Illness:  Back Pain      This is a 77 year old woman who presents with Back pain.  hx same pain symptoms with OV 3/21 - dx with radiculitis and tx with pred - symptoms much improved initially - but new fall when getting out of bed 2 nights go - accidentally lost balance - but hit left forehead and right knee - now recurring pain in right buttock since that time.  The patient reports rest pain, but denies fever, weakness, loss of sensation, and dysuria.  The pain is located in the right low back and right SI joint.  The pain began at home, gradually, and after a fall.  The pain radiates to the right buttock.  The pain is made worse by standing or walking and activity.  The pain is made better by inactivity and acetaminophen.  Risk factors for serious underlying conditions include age >= 50 years and significant trauma.    Caregiver would like to increase dose of memory loss medications - ?axon commercial seen  Current Medications (verified): 1)  Nexium 20 Mg Pack (Esomeprazole Magnesium) .Marland Kitchen.. 1 By Mouth Once Daily 2)  Fluticasone Propionate 50 Mcg/act Susp (Fluticasone Propionate) .... 2 Spray/side Once Daily 3)  Aricept 10 Mg Tabs (Donepezil Hcl)  .Marland Kitchen.. 1po Once Daily 4)  Aspir-Low 81 Mg Tbec (Aspirin) .Marland Kitchen.. 1 By Mouth Once Daily 5)  Proair Hfa 108 (90 Base) Mcg/act Aers (Albuterol Sulfate) .... 2 Puffs Four Times Per Day As Needed For Wheezing or Shortness or Breath 6)  Tramadol Hcl 50 Mg Tabs (Tramadol Hcl) .Marland Kitchen.. 1po Q 6 Hrs As Needed 7)  Flexeril 5 Mg Tabs (Cyclobenzaprine Hcl) .Marland Kitchen.. 1po Three Times A Day As Needed Muscle Spasms  Allergies (verified): 1)  ! Reglan 2)  ! Zocor 3)  ! Claritin- D 4)  ! * Metoclopramide  Past History:  Past Medical History: GERD chronic cough chronic tongue/mouth pain dyslipidemia Coronary artery disease Cerebrovascular accident, hx of - lacunar Allergic rhinitis Hypertension Asthma Hyperlipidemia    Review of Systems  The patient denies fever, weight loss, chest pain, headaches, and difficulty walking.    Physical Exam  General:  alert, well-developed, well-nourished, and cooperative to examination.   caregiver (?dtr) at side  Lungs:  normal respiratory effort and normal breath sounds.   Heart:  normal rate, regular rhythm, no murmur, and no rub. BLE without edema. Msk:  back: full range of motion of lumbar spine. mildly tender to palpation over  right si region - no bruise. Negative straight leg raise. Deep tendon reflexes symmetrically intact at Achilles and patella, negative clonus. Sensation intact throughout all dermatomes in bilateral lower extremities. Full strength to manual muscle testing in all major muscule groups including EHL, anterior tibialis, gastrocnemius, quadriceps, and iliopsoas. Able to heel and toe walk without difficulty and ambulates with a normal gait.  Neurologic:  alert & oriented X3 and cranial nerves II-XII symetrically intact.  strength normal in all extremities, sensation intact to light touch, and gait normal. speech fluent without dysarthria or aphasia; follows commands with good comprehension.  Skin:  healing bruise over left forhead and right medial knee  without hematoma or bleeding or warmth at either site -    Impression & Recommendations:  Problem # 1:  BACK PAIN (ICD-724.5) recurrent symptoms in setting of recent fall - r/o traumatic injury with xrays now - retx with pred and cont ultracet/flex for pain relief - f/u with JWJ on same as needed  Her updated medication list for this problem includes:    Aspir-low 81 Mg Tbec (Aspirin) .Marland Kitchen... 1 by mouth once daily    Flexeril 5 Mg Tabs (Cyclobenzaprine hcl) .Marland Kitchen... 1po three times a day as needed muscle spasms    Tramadol-acetaminophen 37.5-325 Mg Tabs (Tramadol-acetaminophen) .Marland Kitchen... 1 by mouth every 6 hours as needed for pain  Orders: T-Lumbar Spine 2 Views (72100TC) T-Pelvis 1or 2 views (72170TC)  Problem # 2:  SENILE DEMENTIA, UNCOMPLICATED (ICD-290.0)  add namenda to aricept  (already at max dose of aricept) consider axon in future -- defer to pcp f/u 4 weeks on effects of this med or other options for tx new erx done  Orders: Prescription Created Electronically [phone])  Complete Medication List: 1)  Nexium 20 Mg Pack (Esomeprazole magnesium) .Marland Kitchen.. 1 by mouth once daily 2)  Fluticasone Propionate 50 Mcg/act Susp (Fluticasone propionate) .... 2 spray/side once daily 3)  Aricept 10 Mg Tabs (Donepezil hcl) .Marland Kitchen.. 1po once daily 4)  Aspir-low 81 Mg Tbec (Aspirin) .Marland Kitchen.. 1 by mouth once daily 5)  Proair Hfa 108 (90 Base) Mcg/act Aers (Albuterol sulfate) .... 2 puffs four times per day as needed for wheezing or shortness or breath 6)  Flexeril 5 Mg Tabs (Cyclobenzaprine hcl) .Marland Kitchen.. 1po three times a day as needed muscle spasms 7)  Prednisone (pak) 10 Mg Tabs (Prednisone) .... As directed x 6 days 8)  Tramadol-acetaminophen 37.5-325 Mg Tabs (Tramadol-acetaminophen) .Marland Kitchen.. 1 by mouth every 6 hours as needed for pain 9)  Namenda 5 Mg Tabs (Memantine hcl) .Marland Kitchen.. 1 by mouth once daily  Patient Instructions: 1)  it was good to see you today. 2)  xrays ordered today - your results will be called to  you in 48-72 hours from the time of test completion 3)  for pain, treat with pred pak and change pain pill to generic ultracet - ok to continue the flexeril for spasm as before 4)  add namenda to aricept for memory loss -  5)  your prescriptions have been electronically submitted to your pharmacy. Please take as directed. Contact our office if you believe you're having problems with the medication(s).  6)  Please schedule a follow-up appointment with Dr. Jonny Ruiz in 4 weeks to review memory loss, call sooner if problems.  Prescriptions: NAMENDA 5 MG TABS (MEMANTINE HCL) 1 by mouth once daily  #30 x 2   Entered and Authorized by:   Newt Lukes MD   Signed by:   Newt Lukes MD  on 05/21/2009   Method used:   [facility]        [facility] [address] [phone]* (retail)       [address]       [place], [place]  [address]       Ph: [phone] or [phone]       Fax: [phone]   RxID:   [id number] PREDNISONE (PAK) 10 MG TABS (PREDNISONE) as directed x 6 days  #1 x 0   Entered and Authorized by:   Newt Lukes MD   Signed by:   Newt Lukes MD on 05/21/2009   Method used:   [facility]        [facility] [address] [phone]* (retail)       [address]       [place], [place]  [address]       Ph: [phone] or [phone]       Fax: [phone]   RxID:   [id number] TRAMADOL-ACETAMINOPHEN 37.5-325 MG TABS (TRAMADOL-ACETAMINOPHEN) 1 by mouth every 6 hours as needed for pain  #40 x 1   Entered and Authorized by:   Newt Lukes MD   Signed by:   Newt Lukes MD on 05/21/2009   Method used:   [facility]        [facility] [address] [phone]* (retail)       [address]       [place], [place]  [address]       Ph: [phone] or [phone]       Fax: [phone]   RxID:   [id number]

## 2010-02-01 NOTE — Letter (Signed)
Summary: [facility]   Imported By: Sherian Rein 04/22/2009 10:55:29  _____________________________________________________________________  External Attachment:    Type:   Image     Comment:   External Document

## 2010-02-01 NOTE — Miscellaneous (Signed)
Summary: BONE DENSITY  Clinical Lists Changes  Orders: Added new Test order of T-Lumbar Vertebral Assessment (77082) - Signed 

## 2010-02-01 NOTE — Assessment & Plan Note (Signed)
Summary: COLD/CD   Vital Signs:  Patient profile:   77 year old female Height:      62 inches Weight:      172 pounds BMI:     31.57 O2 Sat:      96 % on Room air Temp:     97.5 degrees F oral Pulse rate:   100 / minute BP sitting:   120 / 84  (left arm) Cuff size:   regular  Vitals Entered ByZella Ball Ewing (January 06, 2009 3:13 PM)  O2 Flow:  Room air CC: cough,congestion,wheezing/Re   Primary Care Provider:  Corwin Levins MD  CC:  cough, congestion, and wheezing/Re.  History of Present Illness: here with husband;  c/o 3 days onset ST, fever, and prod cough and lsat night with onset wheezing  adn midl sob/doe;  Pt denies CP, orthopnea, pnd, worsening LE edema, palps, dizziness or syncope .  Pt denies new neuro symptoms such as headache, facial or extremity weakness   Memory problems getting worse and he is helping with some confusion at home.  tolerating the aricept well , no diarrhea  Problems Prior to Update: 1)  Senile Dementia, Uncomplicated  (ICD-290.0) 2)  Bronchitis-acute  (ICD-466.0) 3)  Memory Loss  (ICD-780.93) 4)  Wheezing  (ICD-786.07) 5)  Cough Variant Asthma  (ICD-493.82) 6)  Allergic Rhinitis  (ICD-477.9) 7)  Blurred Vision  (ICD-368.8) 8)  Knee Pain, Left  (ICD-719.46) 9)  Memory Loss  (ICD-780.93) 10)  Thrush  (ICD-112.0) 11)  Throat Pain  (ICD-784.1) 12)  Fatigue  (ICD-780.79) 13)  Asthmatic Bronchitis, Acute  (ICD-466.0) 14)  Chest Pain  (ICD-786.50) 15)  Glossitis  (ICD-529.0) 16)  Asthmatic Bronchitis, Acute  (ICD-466.0) 17)  Hepatotoxicity, Drug-induced, Risk of  (ICD-V58.69) 18)  Hyperlipidemia  (ICD-272.4) 19)  Insomnia-sleep Disorder-unspec  (ICD-307.40) 20)  Glossitis  (ICD-529.0) 21)  Uri  (ICD-465.9) 22)  Family History of Cad Female 1st Degree Relative <50  (ICD-V17.3) 23)  Family History of Cad Female 1st Degree Relative <60  (ICD-V16.49) 24)  Asthma  (ICD-493.90) 25)  Hypertension  (ICD-401.9) 26)  Allergic Rhinitis  (ICD-477.9) 27)   Cerebrovascular Accident, Hx of  (ICD-V12.50) 28)  Coronary Artery Disease  (ICD-414.00) 29)  Gerd  (ICD-530.81)  Medications Prior to Update: 1)  Nexium 20 Mg Pack (Esomeprazole Magnesium) .Marland Kitchen.. 1 By Mouth Once Daily 2)  Fluticasone Propionate 50 Mcg/act Susp (Fluticasone Propionate) .... 2 Spray/side Once Daily 3)  Aricept 5 Mg Tabs (Donepezil Hcl) .Marland Kitchen.. 1 By Mouth Once Daily  Current Medications (verified): 1)  Nexium 20 Mg Pack (Esomeprazole Magnesium) .Marland Kitchen.. 1 By Mouth Once Daily 2)  Fluticasone Propionate 50 Mcg/act Susp (Fluticasone Propionate) .... 2 Spray/side Once Daily 3)  Aricept 10 Mg Tabs (Donepezil Hcl) .Marland Kitchen.. 1 By Mouth Once Daily 4)  Cephalexin 500 Mg Caps (Cephalexin) .Marland Kitchen.. 1po Three Times A Day 5)  Hydrocodone-Homatropine 5-1.5 Mg/69ml Syrp (Hydrocodone-Homatropine) .Marland Kitchen.. 1 Tsp By Mouth Q 6 Hrs As Needed Cough 6)  Prednisone 10 Mg Tabs (Prednisone) .... 3po Qd For 3days, Then 2po Qd For 3days, Then 1po Qd For 3days, Then Stop  Allergies (verified): 1)  ! Reglan 2)  ! Zocor 3)  ! Claritin- D 4)  ! * Metoclopramide  Past History:  Past Medical History: Last updated: 12/16/2008 GERD chronic cough chronic tongue/mouth pain dyslipidemia Coronary artery disease Cerebrovascular accident, hx of - lacunar Allergic rhinitis Hypertension Asthma Hyperlipidemia  Past Surgical History: Last updated: 11/09/2006 s/p D&C  Social History:  Last updated: 01/06/2009 Never Smoked Alcohol use-no Married  Risk Factors: Smoking Status: never (11/09/2006)  Social History: Reviewed history from 11/09/2006 and no changes required. Never Smoked Alcohol use-no Married  Review of Systems       all otherwise negative per pt -  Physical Exam  General:  alert and overweight-appearing.   mild ill  Head:  normocephalic and atraumatic.   Eyes:  vision grossly intact, pupils equal, and pupils round.   Ears:  bilat tm's red, sinus nontender Nose:  no external deformity and no  nasal discharge.   Mouth:  pharyngeal erythema and fair dentition.   Neck:  supple and no masses.   Lungs:  normal respiratory effort, R decreased breath sounds, R wheezes, L decreased breath sounds, and L wheezes.   Heart:  normal rate and regular rhythm.   Extremities:  no edema, no erythema  Neurologic:  cranial nerves II-XII intact, strength normal in all extremities, and confused.     Impression & Recommendations:  Problem # 1:  BRONCHITIS-ACUTE (ICD-466.0)  Her updated medication list for this problem includes:    Cephalexin 500 Mg Caps (Cephalexin) .Marland Kitchen... 1po three times a day    Hydrocodone-homatropine 5-1.5 Mg/68ml Syrp (Hydrocodone-homatropine) .Marland Kitchen... 1 tsp by mouth q 6 hrs as needed cough treat as above, f/u any worsening signs or symptoms   Problem # 2:  WHEEZING (ICD-786.07) mild, due to above, tx with depo shot, and prednisone burst and taper off Orders: Depo- Medrol 40mg  (J1030) Depo- Medrol 80mg  (J1040) Admin of Therapeutic Inj  intramuscular or subcutaneous (78469)  Problem # 3:  SENILE DEMENTIA, UNCOMPLICATED (ICD-290.0) to increase the aricept to 10 mg; MRI and 2010 labs reviewed with husband and pt  Complete Medication List: 1)  Nexium 20 Mg Pack (Esomeprazole magnesium) .Marland Kitchen.. 1 by mouth once daily 2)  Fluticasone Propionate 50 Mcg/act Susp (Fluticasone propionate) .... 2 spray/side once daily 3)  Aricept 10 Mg Tabs (Donepezil hcl) .Marland Kitchen.. 1 by mouth once daily 4)  Cephalexin 500 Mg Caps (Cephalexin) .Marland Kitchen.. 1po three times a day 5)  Hydrocodone-homatropine 5-1.5 Mg/66ml Syrp (Hydrocodone-homatropine) .Marland Kitchen.. 1 tsp by mouth q 6 hrs as needed cough 6)  Prednisone 10 Mg Tabs (Prednisone) .... 3po qd for 3days, then 2po qd for 3days, then 1po qd for 3days, then stop  Patient Instructions: 1)  you had the steroid shot today 2)  Please take all new medications as prescribed  3)  increase te aricept to 10 mg per day 4)  Continue all previous medications as before this visit    5)  Please schedule a follow-up appointment in 6 months for the "yearly exam" Prescriptions: PREDNISONE 10 MG TABS (PREDNISONE) 3po qd for 3days, then 2po qd for 3days, then 1po qd for 3days, then stop  #18 x 0   Entered and Authorized by:   Corwin Levins MD   Signed by:   Corwin Levins MD on 01/06/2009   Method used:   Print then Give to Patient   RxID:   [id number] HYDROCODONE-HOMATROPINE 5-1.5 MG/5ML SYRP (HYDROCODONE-HOMATROPINE) 1 tsp by mouth q 6 hrs as needed cough  #6 oz x 1   Entered and Authorized by:   Corwin Levins MD   Signed by:   Corwin Levins MD on 01/06/2009   Method used:   Print then Give to Patient   RxID:   [id number] CEPHALEXIN 500 MG CAPS (CEPHALEXIN) 1po three times a day  #30 x 0   Entered and  Authorized by:   Corwin Levins MD   Signed by:   Corwin Levins MD on 01/06/2009   Method used:   Print then Give to Patient   RxID:   [id number] ARICEPT 10 MG TABS (DONEPEZIL HCL) 1 by mouth once daily  #30 x 11   Entered and Authorized by:   Corwin Levins MD   Signed by:   Corwin Levins MD on 01/06/2009   Method used:   Print then Give to Patient   RxID:   [id number]    Medication Administration  Injection # 1:    Medication: Depo- Medrol 40mg     Diagnosis: WHEEZING (ICD-786.07)    Route: IM    Site: LUOQ gluteus    Exp Date: 10/2009    Lot #: [id number]    Mfr: Teva    Given by: Zella Ball Ewing (January 06, 2009 4:03 PM)  Injection # 2:    Medication: Depo- Medrol 80mg     Diagnosis: WHEEZING (ICD-786.07)    Route: IM    Site: LUOQ gluteus    Exp Date: 10/2009    Lot #: [id number]    Mfr: TEva    Given by: Zella Ball Ewing (January 06, 2009 4:03 PM)  Orders Added: 1)  Depo- Medrol 40mg  [J1030] 2)  Depo- Medrol 80mg  [J1040] 3)  Admin of Therapeutic Inj  intramuscular or subcutaneous [96372] 4)  Est. Patient Level IV [15176]

## 2010-02-01 NOTE — Assessment & Plan Note (Signed)
Summary: SICK AFTER STARTING ARACEPT/NWS   Vital Signs:  Patient profile:   77 year old female Height:      62 inches Weight:      163 pounds BMI:     29.92 O2 Sat:      97 % on Room air Temp:     97.6 degrees F oral Pulse rate:   88 / minute BP sitting:   122 / 80  (left arm) Cuff size:   regular  Vitals Entered ByMarland Kitchen Zella Ball Ewing (January 25, 2009 4:03 PM)  O2 Flow:  Room air  CC: pt. sleeping alot since starting Aricept/RE   Primary Care Provider:  Corwin Levins MD  CC:  pt. sleeping alot since starting Aricept/RE.  History of Present Illness: here with family friedn who has been helping more over the past 6 months as it seems ms Mathurin's dementia has evolved more, now helps with meds;  here to relate pt's husband reports that recently in the past few days the pt has been sleeping and seemed fatigued more, but denies fever, chills, sT, cough,  and Pt denies CP, sob, doe, wheezing, orthopnea, pnd, worsening LE edema, palps, dizziness or syncope   No GU symptoms such as dysuria or frequency.  No recent falls and walks, feeds, dresses, toilets herself without difficulty so far.  Per the family friend, the husband is asking that the aricept be reduced to 5 mg again as she tolerated this better without seeming somnolence.  No other new complaints, except has ongoing mouth discomfort and asks for refill on the magic mouthwash.    Problems Prior to Update: 1)  Fatigue  (ICD-780.79) 2)  Senile Dementia, Uncomplicated  (ICD-290.0) 3)  Bronchitis-acute  (ICD-466.0) 4)  Memory Loss  (ICD-780.93) 5)  Wheezing  (ICD-786.07) 6)  Cough Variant Asthma  (ICD-493.82) 7)  Allergic Rhinitis  (ICD-477.9) 8)  Blurred Vision  (ICD-368.8) 9)  Knee Pain, Left  (ICD-719.46) 10)  Memory Loss  (ICD-780.93) 11)  Thrush  (ICD-112.0) 12)  Throat Pain  (ICD-784.1) 13)  Fatigue  (ICD-780.79) 14)  Asthmatic Bronchitis, Acute  (ICD-466.0) 15)  Chest Pain  (ICD-786.50) 16)  Glossitis  (ICD-529.0) 17)   Asthmatic Bronchitis, Acute  (ICD-466.0) 18)  Hepatotoxicity, Drug-induced, Risk of  (ICD-V58.69) 19)  Hyperlipidemia  (ICD-272.4) 20)  Insomnia-sleep Disorder-unspec  (ICD-307.40) 21)  Glossitis  (ICD-529.0) 22)  Uri  (ICD-465.9) 23)  Family History of Cad Female 1st Degree Relative <50  (ICD-V17.3) 24)  Family History of Cad Female 1st Degree Relative <60  (ICD-V16.49) 25)  Asthma  (ICD-493.90) 26)  Hypertension  (ICD-401.9) 27)  Allergic Rhinitis  (ICD-477.9) 28)  Cerebrovascular Accident, Hx of  (ICD-V12.50) 29)  Coronary Artery Disease  (ICD-414.00) 30)  Gerd  (ICD-530.81)  Medications Prior to Update: 1)  Nexium 20 Mg Pack (Esomeprazole Magnesium) .Marland Kitchen.. 1 By Mouth Once Daily 2)  Fluticasone Propionate 50 Mcg/act Susp (Fluticasone Propionate) .... 2 Spray/side Once Daily 3)  Aricept 10 Mg Tabs (Donepezil Hcl) .Marland Kitchen.. 1 By Mouth Once Daily 4)  Cephalexin 500 Mg Caps (Cephalexin) .Marland Kitchen.. 1po Three Times A Day 5)  Hydrocodone-Homatropine 5-1.5 Mg/27ml Syrp (Hydrocodone-Homatropine) .Marland Kitchen.. 1 Tsp By Mouth Q 6 Hrs As Needed Cough 6)  Prednisone 10 Mg Tabs (Prednisone) .... 3po Qd For 3days, Then 2po Qd For 3days, Then 1po Qd For 3days, Then Stop  Current Medications (verified): 1)  Nexium 20 Mg Pack (Esomeprazole Magnesium) .Marland Kitchen.. 1 By Mouth Once Daily 2)  Fluticasone Propionate 50 Mcg/act Susp (  Fluticasone Propionate) .... 2 Spray/side Once Daily 3)  Aricept 5 Mg Tabs (Donepezil Hcl) .Marland Kitchen.. 1po Once Daily 4)  Magic Mouthwash .... 5 Cc By Mouth Swish and Spit Qid As Needed 5)  Aspir-Low 81 Mg Tbec (Aspirin) .Marland Kitchen.. 1 By Mouth Once Daily  Allergies (verified): 1)  ! Reglan 2)  ! Zocor 3)  ! Claritin- D 4)  ! * Metoclopramide  Past History:  Past Medical History: Last updated: 12/16/2008 GERD chronic cough chronic tongue/mouth pain dyslipidemia Coronary artery disease Cerebrovascular accident, hx of - lacunar Allergic rhinitis Hypertension Asthma Hyperlipidemia  Past Surgical  History: Last updated: 11/09/2006 s/p D&C  Social History: Last updated: 01/06/2009 Never Smoked Alcohol use-no Married  Risk Factors: Smoking Status: never (11/09/2006)  Review of Systems       all otherwise negative per pt -  Physical Exam  General:  alert and overweight-appearing.   Head:  normocephalic and atraumatic.   Eyes:  vision grossly intact, pupils equal, and pupils round.   Ears:  R ear normal and L ear normal.   Nose:  no external deformity and no nasal discharge.   Mouth:  no gingival abnormalities and pharynx pink and moist.   Neck:  supple and no masses.   Lungs:  normal respiratory effort and normal breath sounds.   Heart:  normal rate and regular rhythm.   Abdomen:  soft, non-tender, and normal bowel sounds.   Extremities:  no edema, no erythema  Neurologic:  alert & oriented X3 and strength normal in all extremities., pleasantly demented today   Impression & Recommendations:  Problem # 1:  SENILE DEMENTIA, UNCOMPLICATED (ICD-290.0) with somnolence ? assoc with increased aricept strength - to decrease the aricept to 5 mg  Problem # 2:  FATIGUE (ICD-780.79) with increased somnolence , unclear if all due to increased aricept strength;  exam o/w bening but wil check urine studies Orders: T-Culture, Urine ([id number]) TLB-Udip w/ Micro ([id number]-URINE)  Problem # 3:  GLOSSITIS (ICD-529.0) recurrent symtpoms with minimal physical findings - tx with magic mouthwsh  Problem # 4:  HYPERTENSION (ICD-401.9)  BP today: 122/80 Prior BP: 120/84 (01/06/2009)  Prior 10 Yr Risk Heart Disease: N/A (04/04/2008)  Labs Reviewed: K+: 4.0 (01/21/2008) Creat: : 0.7 (01/21/2008)   Chol: 150 (06/29/2008)   HDL: 36.60 (06/29/2008)   LDL: 96 (03/05/2008)   TG: 223.0 (06/29/2008) stable overall by hx and exam, ok to continue meds/tx as is , does not require meds at this time  Complete Medication List: 1)  Nexium 20 Mg Pack (Esomeprazole magnesium) .Marland Kitchen.. 1 by mouth once  daily 2)  Fluticasone Propionate 50 Mcg/act Susp (Fluticasone propionate) .... 2 spray/side once daily 3)  Aricept 5 Mg Tabs (Donepezil hcl) .Marland Kitchen.. 1po once daily 4)  Magic Mouthwash  .... 5 cc by mouth swish and spit qid as needed 5)  Aspir-low 81 Mg Tbec (Aspirin) .Marland Kitchen.. 1 by mouth once daily 6)  Ciprofloxacin Hcl 500 Mg Tabs (Ciprofloxacin hcl) .Marland Kitchen.. 1 by mouth two times a day  Patient Instructions: 1)  Please take all new medications as prescribed 2)  decrease the aricept generic to 5 mg  3)  Take an Aspirin every day.- 81 mg - 1 per day - COATED only 4)  Continue all previous medications as before this visit  5)  Please go to the Lab in the basement for your urine tests today  6)  Please schedule a follow-up appointment in 6 months. for your "yearly exam" Prescriptions: MAGIC MOUTHWASH 5 cc  by mouth swish and spit qid as needed  #1 bottle x 1   Entered and Authorized by:   Corwin Levins MD   Signed by:   Corwin Levins MD on 01/25/2009   Method used:   Print then Give to Patient   RxID:   [id number] ARICEPT 5 MG TABS (DONEPEZIL HCL) 1po once daily  #30 x 11   Entered and Authorized by:   Corwin Levins MD   Signed by:   Corwin Levins MD on 01/25/2009   Method used:   [facility]        [facility] [address] [phone]* (retail)       [address]       [place], [place]  [address]       Ph: [phone] or [phone]       Fax: [phone]   RxID:   [id number]

## 2010-02-01 NOTE — Assessment & Plan Note (Signed)
Summary: YEARLY FU/MEDICARE/ TO COME FASTING/NWS   Vital Signs:  Patient profile:   77 year old female Height:      62 inches Weight:      152.75 pounds BMI:     28.04 O2 Sat:      96 % on Room air Temp:     98.1 degrees F oral Pulse rate:   88 / minute BP sitting:   112 / 70  (left arm) Cuff size:   regular  Vitals Entered By: Zella Ball Ewing CMA Duncan Dull) (July 26, 2009 10:45 AM)  O2 Flow:  Room air  CC: yearly followup/RE Comments due for f/u colonscopy feb 2011 - missed appt, with dr Ewing Schlein   Primary Care Provider:  Corwin Levins MD  CC:  yearly followup/RE.  History of Present Illness: here with friend who lives in [place] , but checks on her 3 times per wk, son lives in [place] and checks on her;  fell out of bed twice in the past 6 mo but now with bed rail;  Pt denies CP, sob, doe, wheezing, orthopnea, pnd, worsening LE edema, palps, dizziness or syncope  Pt denies new neuro symptoms such as headache, facial or extremity weakness  Also here with eye and nasall allergy symtpoms with congestion , clear drainage with itch, no fever or pain. No fever, wt loss, night sweats, loss of appetite or other constitutional symptoms   Here for wellness Diet: Heart Healthy or DM if diabetic Physical Activities: Sedentary, does walk around the block occaionally Depression/mood screen: Negative Hearing: Intact bilateral Visual Acuity: Grossly normal, gets exam yearly, wears reading glasses only ADL's: Capable completely, lives with husband Fall Risk: None Home Safety: Good Cognitive Impairment:  Gen appearance, affect, speech, memory, attention & motor skills grossly intact except for worsening memory loss  End-of-Life Planning: Advance directive - Full code/I agree   Preventive Screening-Counseling & Management      Drug Use:  no.    Problems Prior to Update: 1)  Back Pain  (ICD-724.5) 2)  Constipation  (ICD-564.00) 3)  Epigastric Tenderness  (ICD-789.66) 4)  Nausea With Vomiting   (ICD-787.01) 5)  Asthma Unspecified With Exacerbation  (ICD-493.92) 6)  Fatigue  (ICD-780.79) 7)  Senile Dementia, Uncomplicated  (ICD-290.0) 8)  Memory Loss  (ICD-780.93) 9)  Wheezing  (ICD-786.07) 10)  Cough Variant Asthma  (ICD-493.82) 11)  Allergic Rhinitis  (ICD-477.9) 12)  Blurred Vision  (ICD-368.8) 13)  Knee Pain, Left  (ICD-719.46) 14)  Memory Loss  (ICD-780.93) 15)  Thrush  (ICD-112.0) 16)  Throat Pain  (ICD-784.1) 17)  Fatigue  (ICD-780.79) 18)  Asthmatic Bronchitis, Acute  (ICD-466.0) 19)  Chest Pain  (ICD-786.50) 20)  Glossitis  (ICD-529.0) 21)  Asthmatic Bronchitis, Acute  (ICD-466.0) 22)  Hepatotoxicity, Drug-induced, Risk of  (ICD-V58.69) 23)  Hyperlipidemia  (ICD-272.4) 24)  Insomnia-sleep Disorder-unspec  (ICD-307.40) 25)  Glossitis  (ICD-529.0) 26)  Uri  (ICD-465.9) 27)  Family History of Cad Female 1st Degree Relative <50  (ICD-V17.3) 28)  Family History of Cad Female 1st Degree Relative <60  (ICD-V16.49) 29)  Asthma  (ICD-493.90) 30)  Hypertension  (ICD-401.9) 31)  Allergic Rhinitis  (ICD-477.9) 32)  Cerebrovascular Accident, Hx of  (ICD-V12.50) 33)  Coronary Artery Disease  (ICD-414.00) 34)  Gerd  (ICD-530.81)  Medications Prior to Update: 1)  Nexium 20 Mg Pack (Esomeprazole Magnesium) .Marland Kitchen.. 1 By Mouth Once Daily 2)  Fluticasone Propionate 50 Mcg/act Susp (Fluticasone Propionate) .... 2 Spray/side Once Daily 3)  Aricept 10 Mg  Tabs (Donepezil Hcl) .Marland Kitchen.. 1po Once Daily 4)  Aspir-Low 81 Mg Tbec (Aspirin) .Marland Kitchen.. 1 By Mouth Once Daily 5)  Proair Hfa 108 (90 Base) Mcg/act Aers (Albuterol Sulfate) .... 2 Puffs Four Times Per Day As Needed For Wheezing or Shortness or Breath 6)  Flexeril 5 Mg Tabs (Cyclobenzaprine Hcl) .Marland Kitchen.. 1po Three Times A Day As Needed Muscle Spasms 7)  Tramadol-Acetaminophen 37.5-325 Mg Tabs (Tramadol-Acetaminophen) .Marland Kitchen.. 1 By Mouth Every 6 Hours As Needed For Pain 8)  Namenda 5 Mg Tabs (Memantine Hcl) .Marland Kitchen.. 1 By Mouth Once Daily  Current  Medications (verified): 1)  Nexium 20 Mg Pack (Esomeprazole Magnesium) .Marland Kitchen.. 1 By Mouth Once Daily 2)  Fluticasone Propionate 50 Mcg/act Susp (Fluticasone Propionate) .... 2 Spray/side Once Daily 3)  Aricept 10 Mg Tabs (Donepezil Hcl) .Marland Kitchen.. 1po Once Daily 4)  Aspir-Low 81 Mg Tbec (Aspirin) .Marland Kitchen.. 1 By Mouth Once Daily 5)  Proair Hfa 108 (90 Base) Mcg/act Aers (Albuterol Sulfate) .... 2 Puffs Four Times Per Day As Needed For Wheezing or Shortness or Breath 6)  Flexeril 5 Mg Tabs (Cyclobenzaprine Hcl) .Marland Kitchen.. 1po Three Times A Day As Needed Muscle Spasms 7)  Tramadol-Acetaminophen 37.5-325 Mg Tabs (Tramadol-Acetaminophen) .Marland Kitchen.. 1 By Mouth Every 6 Hours As Needed For Pain 8)  Namenda 5 Mg Tabs (Memantine Hcl) .Marland Kitchen.. 1 By Mouth Once Daily  Allergies (verified): 1)  ! Reglan 2)  ! Zocor 3)  ! Claritin- D 4)  ! * Metoclopramide  Past History:  Family History: Last updated: 11/09/2006 Family History of CAD Female 1st degree relative Family History of CAD Female 1st degree relative  Social History: Last updated: 07/26/2009 Never Smoked Alcohol use-no Married Drug use-no 3 children - 2 died in separate car crash  Risk Factors: Smoking Status: never (11/09/2006)  Past Medical History: GERD chronic cough    chronic tongue/mouth pain dyslipidemia Coronary artery disease Cerebrovascular accident, hx of - lacunar Allergic rhinitis Hypertension Asthma Hyperlipidemia    MD roster:  Dr Ewing Schlein - GI                    optho - not sure of name                   cards - Dr Clarene Duke                 Past Surgical History: Reviewed history from 11/09/2006 and no changes required. s/p D&C  Family History: Reviewed history from 11/09/2006 and no changes required. Family History of CAD Female 1st degree relative Family History of CAD Female 1st degree relative  Social History: Reviewed history from 01/06/2009 and no changes required. Never Smoked Alcohol use-no Married Drug use-no 3 children - 2  died in separate car crash Drug Use:  no  Review of Systems  The patient denies anorexia, fever, vision loss, decreased hearing, hoarseness, chest pain, syncope, dyspnea on exertion, peripheral edema, prolonged cough, headaches, hemoptysis, abdominal pain, melena, hematochezia, severe indigestion/heartburn, hematuria, muscle weakness, suspicious skin lesions, transient blindness, difficulty walking, depression, unusual weight change, abnormal bleeding, enlarged lymph nodes, angioedema, and breast masses.         all otherwise negative per pt -    Physical Exam  General:  alert and overweight-appearing.   Head:  normocephalic and atraumatic.   Eyes:  vision grossly intact, pupils equal, and pupils round.   Ears:  R ear normal and L ear normal.   Nose:  nasal dischargemucosal pallor and mucosal edema.  Mouth:  pharyngeal erythema and fair dentition.   Neck:  supple and no masses.   Lungs:  normal respiratory effort and normal breath sounds.   Heart:  normal rate and regular rhythm.   Abdomen:  soft, non-tender, and normal bowel sounds.   Msk:  no joint tenderness and no joint swelling.   Extremities:  no edema, no erythema  Neurologic:  cranial nerves II-XII intact and strength normal in all extremities.  , has mild to mod dementia Skin:  color normal and no rashes.   Psych:  not anxious appearing and not depressed appearing.     Impression & Recommendations:  Problem # 1:  Preventive Health Care (ICD-V70.0)  Overall doing well, age appropriate education and counseling updated and referral for appropriate preventive services done unless declined, immunizations up to date or declined, diet counseling done if overweight, urged to quit smoking if smokes , most recent labs reviewed and current ordered if appropriate, ecg reviewed or declined (interpretation per ECG scanned in the EMR if done); information regarding Medicare Prevention requirements given if appropriate; speciality referrals  updated as appropriate ; to consider shingles shot but too expensive for now  Orders: First annual wellness visit with prevention plan  (Z6109)  Problem # 2:  FATIGUE (ICD-780.79)  exam benign, to check labs below; follow with expectant management   Orders: TLB-BMP (Basic Metabolic Panel-BMET) (80048-METABOL) TLB-CBC Platelet - w/Differential (85025-CBCD) TLB-Hepatic/Liver Function Pnl (80076-HEPATIC) TLB-TSH (Thyroid Stimulating Hormone) (84443-TSH) TLB-Sedimentation Rate (ESR) (85652-ESR) TLB-IBC Pnl (Iron/FE;Transferrin) (83550-IBC) TLB-B12 + Folate Pnl (82746_82607-B12/FOL) TLB-Udip ONLY (81003-UDIP) T-Vitamin D (25-Hydroxy) (60454-09811)  Problem # 3:  SENILE DEMENTIA, UNCOMPLICATED (ICD-290.0) stable overall by hx and exam, ok to continue meds/tx as is , no worsening behavioral issues  Problem # 4:  DEGENERATIVE DISC DISEASE, LUMBOSACRAL SPINE (ICD-722.52) as per lsat films, reviewed with pt, pain improved,. Continue all previous medications as before this visit   Problem # 5:  ALLERGIC RHINITIS (ICD-477.9)  Her updated medication list for this problem includes:    Fluticasone Propionate 50 Mcg/act Susp (Fluticasone propionate) .Marland Kitchen... 2 spray/side once daily    Fexofenadine Hcl 180 Mg Tabs (Fexofenadine hcl) .Marland Kitchen... 1po once daily as needed allergies add the allegra  as needed   Orders: Prescription Created Electronically [phone])  Problem # 6:  HYPERTENSION (ICD-401.9)  BP today: 112/70 Prior BP: 120/62 (05/21/2009)  Prior 10 Yr Risk Heart Disease: N/A (04/04/2008)  Labs Reviewed: K+: 4.0 (01/21/2008) Creat: : 0.7 (01/21/2008)   Chol: 150 (06/29/2008)   HDL: 36.60 (06/29/2008)   LDL: 96 (03/05/2008)   TG: 223.0 (06/29/2008) stable overall by hx and exam, ok to continue meds/tx as is   Complete Medication List: 1)  Nexium 20 Mg Pack (Esomeprazole magnesium) .Marland Kitchen.. 1 by mouth once daily 2)  Fluticasone Propionate 50 Mcg/act Susp (Fluticasone propionate) .... 2  spray/side once daily 3)  Aricept 10 Mg Tabs (Donepezil hcl) .Marland Kitchen.. 1po once daily 4)  Aspir-low 81 Mg Tbec (Aspirin) .Marland Kitchen.. 1 by mouth once daily 5)  Proair Hfa 108 (90 Base) Mcg/act Aers (Albuterol sulfate) .... 2 puffs four times per day as needed for wheezing or shortness or breath 6)  Flexeril 5 Mg Tabs (Cyclobenzaprine hcl) .Marland Kitchen.. 1po three times a day as needed muscle spasms 7)  Tramadol-acetaminophen 37.5-325 Mg Tabs (Tramadol-acetaminophen) .Marland Kitchen.. 1 by mouth every 6 hours as needed for pain 8)  Namenda 5 Mg Tabs (Memantine hcl) .Marland Kitchen.. 1 by mouth once daily 9)  Fexofenadine Hcl 180 Mg Tabs (Fexofenadine hcl) .Marland KitchenMarland KitchenMarland Kitchen  1po once daily as needed allergies  Other Orders: T-Bone Densitometry [phone]) TLB-Lipid Panel (80061-LIPID)  Patient Instructions: 1)  Please call Dr Marlane Hatcher office to schedule the colonoscopy 2)  please call for your yearly mammogram- consider [facility] on [address], or [facility] on [address] 3)  please schedule the bone density test before leaving today 4)  Please go to the Lab in the basement for your blood and/or urine tests today  5)  Continue all previous medications as before this visit , including the generic flonase for the nasal allergies 6)  you can also use Allegra as needed for the nasal and eye allergy symptoms as well  7)  Please schedule a follow-up appointment in 6 months. Prescriptions: FEXOFENADINE HCL 180 MG TABS (FEXOFENADINE HCL) 1po once daily as needed allergies  #90 x 3   Entered and Authorized by:   Corwin Levins MD   Signed by:   Corwin Levins MD on 07/26/2009   Method used:   Print then Give to Patient   RxID:   [id number]

## 2010-02-01 NOTE — Assessment & Plan Note (Signed)
Summary: prolia injection/[name]/cd   Nurse Visit   Allergies: 1)  ! Reglan 2)  ! Zocor 3)  ! Claritin- D 4)  ! * Metoclopramide  Medication Administration  Injection # 1:    Medication: Prolia 60mg     Diagnosis: OSTEOPOROSIS (ICD-733.00)    Route: SQ    Site: R deltoid    Exp Date: 12/2010    Lot #: [id number]    Mfr: [employer]    Patient tolerated injection without complications    Given by: Brenton Grills MA (October 13, 2009 2:09 PM)  Orders Added: 1)  Admin of Therapeutic Inj  intramuscular or subcutaneous [96372] 2)  Prolia 60mg  [J3590]

## 2010-02-02 ENCOUNTER — Ambulatory Visit: Payer: Self-pay | Admitting: Internal Medicine

## 2010-02-02 ENCOUNTER — Other Ambulatory Visit: Payer: Self-pay | Admitting: Internal Medicine

## 2010-02-02 DIAGNOSIS — E049 Nontoxic goiter, unspecified: Secondary | ICD-10-CM

## 2010-02-07 ENCOUNTER — Ambulatory Visit
Admission: RE | Admit: 2010-02-07 | Discharge: 2010-02-07 | Disposition: A | Discharge status: Home / Self Care | Payer: Self-pay | Source: Ambulatory Visit | Attending: Internal Medicine | Admitting: Internal Medicine

## 2010-02-07 DIAGNOSIS — E049 Nontoxic goiter, unspecified: Secondary | ICD-10-CM

## 2010-02-09 NOTE — Assessment & Plan Note (Signed)
Summary: 6 MO ROV  /NWS  #   Vital Signs:  Patient profile:   77 year old female Height:      62 inches Weight:      143.25 pounds BMI:     26.30 O2 Sat:      92 % on Room air Temp:     98 degrees F oral Pulse rate:   66 / minute BP sitting:   112 / 70  (left arm) Cuff size:   regular  Vitals Entered By: Zella Ball Ewing CMA (AAMA) (February 01, 2010 1:35 PM)  O2 Flow:  Room air CC: 6 month ROV/RE   Primary Care Provider:  Corwin Levins MD  CC:  6 month ROV/RE.  History of Present Illness: here with family,  has ongoing worsening memory loss  but now worsening agiation, hallucinations or panoaoia;  hx limited by dementia but Pt denies CP, worsening sob, doe, wheezing, orthopnea, pnd, worsening LE edema, palps, dizziness or syncope  Pt denies new neuro symptoms such as headache, facial or extremity weakness  Pt denies polydipsia, polyuria  Overall good compliance with meds, trying to follow low chol  diet, wt stable, little excercise however.  Has never smoked.  Biggest c/o chronic non prod cough;  has known allergies not well controlled  with oral meds alone, as well as prob cough variant asthma,  but simply will not use nasal spray or inhalers that she has been prescribed.  Only taking nexium 20 mg as needed as well.  Overall o/w good compliance with meds, and good tolerability. Also with rash with itching to accessbile ares she can scratch to the base of the neck anteriorly, as well as the upper back and shoulders, tends to scratch though now has open sores, worse at night it seems and scratches in her sleep.    Problems Prior to Update: 1)  Goiter, Unspecified  (ICD-240.9) 2)  Cough  (ICD-786.2) 3)  Rash-nonvesicular  (ICD-782.1) 4)  Osteoporosis  (ICD-733.00) 5)  Rash-nonvesicular  (ICD-782.1) 6)  Uri  (ICD-465.9) 7)  Preventive Health Care  (ICD-V70.0) 8)  Degenerative Disc Disease, Lumbosacral Spine  (ICD-722.52) 9)  Menopausal Disorder  (ICD-627.9) 10)  Back Pain   (ICD-724.5) 11)  Constipation  (ICD-564.00) 12)  Epigastric Tenderness  (ICD-789.66) 13)  Nausea With Vomiting  (ICD-787.01) 14)  Asthma Unspecified With Exacerbation  (ICD-493.92) 15)  Fatigue  (ICD-780.79) 16)  Senile Dementia, Uncomplicated  (ICD-290.0) 17)  Memory Loss  (ICD-780.93) 18)  Wheezing  (ICD-786.07) 19)  Cough Variant Asthma  (ICD-493.82) 20)  Allergic Rhinitis  (ICD-477.9) 21)  Blurred Vision  (ICD-368.8) 22)  Knee Pain, Left  (ICD-719.46) 23)  Memory Loss  (ICD-780.93) 24)  Thrush  (ICD-112.0) 25)  Throat Pain  (ICD-784.1) 26)  Fatigue  (ICD-780.79) 27)  Asthmatic Bronchitis, Acute  (ICD-466.0) 28)  Chest Pain  (ICD-786.50) 29)  Glossitis  (ICD-529.0) 30)  Asthmatic Bronchitis, Acute  (ICD-466.0) 31)  Hepatotoxicity, Drug-induced, Risk of  (ICD-V58.69) 32)  Hyperlipidemia  (ICD-272.4) 33)  Insomnia-sleep Disorder-unspec  (ICD-307.40) 34)  Glossitis  (ICD-529.0) 35)  Uri  (ICD-465.9) 36)  Family History of Cad Female 1st Degree Relative <50  (ICD-V17.3) 37)  Family History of Cad Female 1st Degree Relative <60  (ICD-V16.49) 38)  Asthma  (ICD-493.90) 39)  Hypertension  (ICD-401.9) 40)  Allergic Rhinitis  (ICD-477.9) 41)  Cerebrovascular Accident, Hx of  (ICD-V12.50) 42)  Coronary Artery Disease  (ICD-414.00) 43)  Gerd  (ICD-530.81)  Medications Prior to Update: 1)  Nexium 20 Mg Pack (Esomeprazole Magnesium) .Marland Kitchen.. 1 By Mouth Once Daily 2)  Fluticasone Propionate 50 Mcg/act Susp (Fluticasone Propionate) .... 2 Spray/side Once Daily 3)  Aricept 10 Mg Tabs (Donepezil Hcl) .Marland Kitchen.. 1po Once Daily 4)  Aspir-Low 81 Mg Tbec (Aspirin) .Marland Kitchen.. 1 By Mouth Once Daily 5)  Proair Hfa 108 (90 Base) Mcg/act Aers (Albuterol Sulfate) .... 2 Puffs Four Times Per Day As Needed For Wheezing or Shortness or Breath 6)  Flexeril 5 Mg Tabs (Cyclobenzaprine Hcl) .Marland Kitchen.. 1po Three Times A Day As Needed Muscle Spasms 7)  Tramadol-Acetaminophen 37.5-325 Mg Tabs (Tramadol-Acetaminophen) .Marland Kitchen.. 1 By Mouth  Every 6 Hours As Needed For Pain 8)  Namenda 5 Mg Tabs (Memantine Hcl) .Marland Kitchen.. 1 By Mouth Once Daily 9)  Fexofenadine Hcl 180 Mg Tabs (Fexofenadine Hcl) .Marland Kitchen.. 1po Once Daily As Needed Allergies 10)  Pravastatin Sodium 40 Mg Tabs (Pravastatin Sodium) .Marland Kitchen.. 1po Once Daily 11)  Nitrofurantoin Macrocrystal 100 Mg Caps (Nitrofurantoin Macrocrystal) .Marland Kitchen.. 1po Two Times A Day 12)  Azithromycin 250 Mg Tabs (Azithromycin) .... 2po Qd For 1 Day, Then 1po Qd For 4days, Then Stop 13)  Hydrocodone-Homatropine 5-1.5 Mg/22ml Syrp (Hydrocodone-Homatropine) .Marland Kitchen.. 1 Tsp By Mouth Q 6 Hrs As Needed Cough 14)  Tessalon Perles 100 Mg Caps (Benzonatate) .Marland Kitchen.. 1-2 By Mouth Three Times A Day As Needed For Cough 15)  Nystatin  Powd (Nystatin) .... Use Asd Two Times A Day As Needed To Affected Area  Current Medications (verified): 1)  Nexium 40 Mg Cpdr (Esomeprazole Magnesium) .Marland Kitchen.. 1po Once Daily 2)  Fluticasone Propionate 50 Mcg/act Susp (Fluticasone Propionate) .... 2 Spray/side Once Daily 3)  Aricept 10 Mg Tabs (Donepezil Hcl) .Marland Kitchen.. 1po Once Daily 4)  Aspir-Low 81 Mg Tbec (Aspirin) .Marland Kitchen.. 1 By Mouth Once Daily 5)  Proair Hfa 108 (90 Base) Mcg/act Aers (Albuterol Sulfate) .... 2 Puffs Four Times Per Day As Needed For Wheezing or Shortness or Breath 6)  Flexeril 5 Mg Tabs (Cyclobenzaprine Hcl) .Marland Kitchen.. 1po Three Times A Day As Needed Muscle Spasms 7)  Tramadol-Acetaminophen 37.5-325 Mg Tabs (Tramadol-Acetaminophen) .Marland Kitchen.. 1 By Mouth Every 6 Hours As Needed For Pain 8)  Namenda 5 Mg Tabs (Memantine Hcl) .Marland Kitchen.. 1 By Mouth Once Daily 9)  Fexofenadine Hcl 180 Mg Tabs (Fexofenadine Hcl) .Marland Kitchen.. 1po Once Daily As Needed Allergies 10)  Pravastatin Sodium 40 Mg Tabs (Pravastatin Sodium) .Marland Kitchen.. 1po Once Daily 11)  Hydrocodone-Homatropine 5-1.5 Mg/60ml Syrp (Hydrocodone-Homatropine) .Marland Kitchen.. 1 Tsp By Mouth Q 6 Hrs As Needed Cough 12)  Tessalon Perles 100 Mg Caps (Benzonatate) .Marland Kitchen.. 1-2 By Mouth Three Times A Day As Needed For Cough 13)  Nystatin  Powd  (Nystatin) .... Use Asd Two Times A Day As Needed To Affected Area 14)  Vitamin D 2000 Unit Tabs (Cholecalciferol) .Marland Kitchen.. 1 By Mouth Once Daily 15)  Triamcinolone Acetonide 0.1 % Crea (Triamcinolone Acetonide) .... Use Asd Two Times A Day As Needed  Allergies (verified): 1)  ! Reglan 2)  ! Zocor 3)  ! Claritin- D 4)  ! * Metoclopramide  Past History:  Past Medical History: Last updated: 07/26/2009 GERD chronic cough    chronic tongue/mouth pain dyslipidemia Coronary artery disease Cerebrovascular accident, hx of - lacunar Allergic rhinitis Hypertension Asthma Hyperlipidemia    MD roster:  Dr Ewing Schlein - GI                    optho - not sure of name  cards - Dr Clarene Duke                 Past Surgical History: Last updated: 11/09/2006 s/p D&C  Social History: Last updated: 07/26/2009 Never Smoked Alcohol use-no Married Drug use-no 3 children - 2 died in separate car crash  Risk Factors: Smoking Status: never (11/09/2006)  Review of Systems       all otherwise negative per pt -    Physical Exam  General:  alert and overweight-appearing. Head:  normocephalic and atraumatic.   Eyes:  vision grossly intact, pupils equal, and pupils round.   Ears:  R ear normal and L ear normal.   Nose:  nasal dischargemucosal pallor and mucosal edema.   Mouth:  no gingival abnormalities and pharynx pink and moist.   Neck:  supple and no masses.  except for right thyroid nodule nontender and ? > 1 cm Lungs:  normal respiratory effort, R decreased breath sounds, and L decreased breath sounds.  but no rales or wheezing Heart:  normal rate and regular rhythm Abdomen:  soft, non-tender, and normal bowel sounds.   Extremities:  no edema, no erythema  Skin:  scaly erythem nontender rash adn excoriations to bilat ant lower neck, as well as several sores and lesoins to upper back as well  Psych:  not anxious appearing and not depressed appearing.     Impression &  Recommendations:  Problem # 1:  RASH-NONVESICULAR (ICD-782.1)  Her updated medication list for this problem includes:    Triamcinolone Acetonide 0.1 % Crea (Triamcinolone acetonide) ..... Use asd two times a day as needed treat as above, f/u any worsening signs or symptoms   Problem # 2:  COUGH (ICD-786.2) ? related to asthma as thought in the past, but pt denies wheezing or other pulm symptoms, declines trial inhaler, in fact has steroid inhalers at home she wont use; declines flonase re-start, but will increase the nexium to 40 mg per day  Problem # 3:  GOITER, UNSPECIFIED (ICD-240.9)  with  ? right nodule- for thyroid u/s  Orders: Radiology Referral (Radiology)  Problem # 4:  HYPERTENSION (ICD-401.9)  BP today: 112/70 Prior BP: 110/72 (08/02/2009)  Prior 10 Yr Risk Heart Disease: N/A (04/04/2008)  Labs Reviewed: K+: 4.7 (07/26/2009) Creat: : 0.7 (07/26/2009)   Chol: 238 (07/26/2009)   HDL: 38.30 (07/26/2009)   LDL: 96 (03/05/2008)   TG: 257.0 (07/26/2009) stable overall by hx and exam, ok to continue meds/tx as is   Complete Medication List: 1)  Nexium 40 Mg Cpdr (Esomeprazole magnesium) .Marland Kitchen.. 1po once daily 2)  Fluticasone Propionate 50 Mcg/act Susp (Fluticasone propionate) .... 2 spray/side once daily 3)  Aricept 10 Mg Tabs (Donepezil hcl) .Marland Kitchen.. 1po once daily 4)  Aspir-low 81 Mg Tbec (Aspirin) .Marland Kitchen.. 1 by mouth once daily 5)  Proair Hfa 108 (90 Base) Mcg/act Aers (Albuterol sulfate) .... 2 puffs four times per day as needed for wheezing or shortness or breath 6)  Flexeril 5 Mg Tabs (Cyclobenzaprine hcl) .Marland Kitchen.. 1po three times a day as needed muscle spasms 7)  Tramadol-acetaminophen 37.5-325 Mg Tabs (Tramadol-acetaminophen) .Marland Kitchen.. 1 by mouth every 6 hours as needed for pain 8)  Namenda 5 Mg Tabs (Memantine hcl) .Marland Kitchen.. 1 by mouth once daily 9)  Fexofenadine Hcl 180 Mg Tabs (Fexofenadine hcl) .Marland Kitchen.. 1po once daily as needed allergies 10)  Pravastatin Sodium 40 Mg Tabs (Pravastatin  sodium) .Marland Kitchen.. 1po once daily 11)  Hydrocodone-homatropine 5-1.5 Mg/88ml Syrp (Hydrocodone-homatropine) .Marland Kitchen.. 1 tsp by mouth q 6  hrs as needed cough 12)  Tessalon Perles 100 Mg Caps (Benzonatate) .Marland Kitchen.. 1-2 by mouth three times a day as needed for cough 13)  Nystatin Powd (Nystatin) .... Use asd two times a day as needed to affected area 14)  Vitamin D 2000 Unit Tabs (Cholecalciferol) .Marland Kitchen.. 1 by mouth once daily 15)  Triamcinolone Acetonide 0.1 % Crea (Triamcinolone acetonide) .... Use asd two times a day as needed  Patient Instructions: 1)  You are given the samples of the Dexilant (stronger nexium kind of medication to try at two times a day until gong) 2)  after that , please re-start the nexium at 40 mg to see if this helps as well 3)  please consider taking the nasal spray to better help the nasal allergies and possible cough from this 4)  You will be contacted about the referral(s) to: thyroid ultrasound 5)  Please take all new medications as prescribed  - the cream for the sores 6)  you are given the handicap sticker today 7)  Please schedule a follow-up appointment in 6 months, or sooner if needed Prescriptions: TRIAMCINOLONE ACETONIDE 0.1 % CREA (TRIAMCINOLONE ACETONIDE) use asd two times a day as needed  #1large x 1   Entered and Authorized by:   Corwin Levins MD   Signed by:   Corwin Levins MD on 02/01/2010   Method used:   Print then Give to Patient   RxID:   [id number] NEXIUM 40 MG CPDR (ESOMEPRAZOLE MAGNESIUM) 1po once daily  #90 x 3   Entered and Authorized by:   Corwin Levins MD   Signed by:   Corwin Levins MD on 02/01/2010   Method used:   Print then Give to Patient   RxID:   [id number] TESSALON PERLES 100 MG CAPS (BENZONATATE) 1-2 by mouth three times a day as needed for cough  #90 x 1   Entered and Authorized by:   Corwin Levins MD   Signed by:   Corwin Levins MD on 02/01/2010   Method used:   Print then Give to Patient   RxID:    [id number] HYDROCODONE-HOMATROPINE 5-1.5 MG/5ML SYRP (HYDROCODONE-HOMATROPINE) 1 tsp by mouth q 6 hrs as needed cough  #6oz x 1   Entered and Authorized by:   Corwin Levins MD   Signed by:   Corwin Levins MD on 02/01/2010   Method used:   Print then Give to Patient   RxID:   [id number]    Orders Added: 1)  Radiology Referral [Radiology] 2)  Est. Patient Level IV [38756]

## 2010-03-08 ENCOUNTER — Telehealth: Payer: Self-pay | Admitting: Internal Medicine

## 2010-03-14 ENCOUNTER — Telehealth: Payer: Self-pay | Admitting: Internal Medicine

## 2010-03-15 NOTE — Progress Notes (Signed)
  Phone Note Refill Request Message from:  Fax from Pharmacy on March 08, 2010 3:47 PM  Refills Requested: Medication #1:  NAMENDA 5 MG TABS 1 by mouth once daily   Dosage confirmed as above?Dosage Confirmed   Last Refilled: 10/04/2009   Notes: [facility]  Medication #2:  PRAVASTATIN SODIUM 40 MG TABS 1po once daily   Dosage confirmed as above?Dosage Confirmed   Last Refilled: 07/26/2009   Notes: [facility] Initial call taken by: Robin Ewing CMA Duncan Dull),  March 08, 2010 3:48 PM    Prescriptions: PRAVASTATIN SODIUM 40 MG TABS (PRAVASTATIN SODIUM) 1po once daily  #90 x 3   Entered by:   Scharlene Gloss CMA (AAMA)   Authorized by:   Corwin Levins MD   Signed by:   Scharlene Gloss CMA (AAMA) on 03/08/2010   Method used:   Faxed to ...       [facility])       [address] [address]       [place], [place]  [address]       Ph: [phone]       Fax: [phone]   RxID:   [id number] NAMENDA 5 MG TABS (MEMANTINE HCL) 1 by mouth once daily  #90 x 3   Entered by:   Zella Ball Ewing CMA (AAMA)   Authorized by:   Corwin Levins MD   Signed by:   Scharlene Gloss CMA (AAMA) on 03/08/2010   Method used:   Faxed to ...       [facility])       [address] [address]       [place], [place]  [address]       Ph: [phone]       Fax: [phone]   RxID:   [id number]

## 2010-03-22 NOTE — Progress Notes (Signed)
Summary: Rx refill req  Phone Note Refill Request Message from:  Pharmacy on March 14, 2010 4:46 PM  Refills Requested: Medication #1:  ARICEPT 10 MG TABS 1po once daily   Dosage confirmed as above?Dosage Confirmed   Supply Requested: 1 year  Medication #2:  NEXIUM 40 MG CPDR 1po once daily   Dosage confirmed as above?Dosage Confirmed   Supply Requested: 1 year  Method Requested: Fax to [facility] Initial call taken by: Margaret Pyle, CMA,  March 14, 2010 4:47 PM    Prescriptions: ARICEPT 10 MG TABS (DONEPEZIL HCL) 1po once daily  #90 x 3   Entered by:   Margaret Pyle, CMA   Authorized by:   Corwin Levins MD   Signed by:   Margaret Pyle, CMA on 03/14/2010   Method used:   Faxed to ...       [facility])             , [place]         Ph: [phone]       Fax: [phone]   RxID:   [id number] NEXIUM 40 MG CPDR (ESOMEPRAZOLE MAGNESIUM) 1po once daily  #90 x 3   Entered by:   Margaret Pyle, CMA   Authorized by:   Corwin Levins MD   Signed by:   Margaret Pyle, CMA on 03/14/2010   Method used:   Faxed to ...       [facility])             , [place]         Ph: [phone]       Fax: [phone]   RxID:   [id number]

## 2010-04-13 ENCOUNTER — Telehealth: Payer: Self-pay | Admitting: *Deleted

## 2010-04-13 NOTE — Telephone Encounter (Signed)
Pt is due for Prolia on 04-14-10.... rec her benefit summary stating her OOP is $0.  I informed her husband of this because he states pt is sleeping.... I transferred him to scheduler to make appt for next week. Will order Prolia.

## 2010-04-18 ENCOUNTER — Encounter: Payer: Self-pay | Admitting: Internal Medicine

## 2010-04-18 DIAGNOSIS — J309 Allergic rhinitis, unspecified: Secondary | ICD-10-CM

## 2010-04-19 ENCOUNTER — Encounter: Payer: Self-pay | Admitting: Internal Medicine

## 2010-04-19 ENCOUNTER — Ambulatory Visit: Payer: Self-pay

## 2010-04-19 ENCOUNTER — Ambulatory Visit (INDEPENDENT_AMBULATORY_CARE_PROVIDER_SITE_OTHER): Payer: Medicare Other | Admitting: Internal Medicine

## 2010-04-19 VITALS — BP 92/54 | HR 72 | Temp 98.4°F | Ht 62.0 in | Wt 136.5 lb

## 2010-04-19 DIAGNOSIS — L0291 Cutaneous abscess, unspecified: Secondary | ICD-10-CM

## 2010-04-19 DIAGNOSIS — M810 Age-related osteoporosis without current pathological fracture: Secondary | ICD-10-CM

## 2010-04-19 DIAGNOSIS — I10 Essential (primary) hypertension: Secondary | ICD-10-CM

## 2010-04-19 DIAGNOSIS — J45909 Unspecified asthma, uncomplicated: Secondary | ICD-10-CM

## 2010-04-19 DIAGNOSIS — L0390 Cellulitis, unspecified: Secondary | ICD-10-CM

## 2010-04-19 DIAGNOSIS — F419 Anxiety disorder, unspecified: Secondary | ICD-10-CM

## 2010-04-19 DIAGNOSIS — F411 Generalized anxiety disorder: Secondary | ICD-10-CM

## 2010-04-19 MED ORDER — DOXYCYCLINE MONOHYDRATE 100 MG PO CAPS: 100.0000 mg | ORAL_CAPSULE | Freq: Two times a day (BID) | ORAL | Status: AC

## 2010-04-19 MED ORDER — DENOSUMAB 60 MG/ML ~~LOC~~ SOLN
60.0000 mg | Freq: Once | SUBCUTANEOUS | Status: AC
Start: 2010-04-19 — End: 2010-04-19
  Administered 2010-04-19: 60 mg via SUBCUTANEOUS

## 2010-04-19 MED ORDER — CLONAZEPAM 0.5 MG PO TABS: 0.5000 mg | ORAL_TABLET | Freq: Two times a day (BID) | ORAL | Status: AC | PRN

## 2010-04-19 NOTE — Patient Instructions (Signed)
Take all new medications as prescribed Continue all other medications as before Please return in 6 months, or sooner if needed Please call if you feel you need referral to dermatology

## 2010-04-19 NOTE — Assessment & Plan Note (Signed)
Right shoulder developing with recent picking at her now chronic sores to right shoulder - for doxy course, Continue all other medications as before, also consider derm referral

## 2010-04-19 NOTE — Assessment & Plan Note (Signed)
Mild to mod, assoc with ongoing picking at her sore ; for klonopin prn, may help with sleep at night as well

## 2010-04-20 ENCOUNTER — Encounter: Payer: Self-pay | Admitting: Internal Medicine

## 2010-04-20 NOTE — Assessment & Plan Note (Signed)
stable overall by hx and exam, most recent lab reviewed with pt, and pt to continue medical treatment as before  BP Readings from Last 3 Encounters:  04/19/10 92/54  02/01/10 112/70  08/02/09 110/72

## 2010-04-20 NOTE — Assessment & Plan Note (Signed)
stable overall by hx and exam, most recent lab reviewed with pt, and pt to continue medical treatment as before\  Lab Results  Component Value Date   WBC 6.6 07/26/2009   HGB 14.2 07/26/2009   HCT 41.4 07/26/2009   PLT 207.0 07/26/2009   CHOL 238* 07/26/2009   TRIG 257.0* 07/26/2009   HDL 38.30* 07/26/2009   LDLDIRECT 168.6 07/26/2009   ALT 18 07/26/2009   AST 24 07/26/2009   NA 138 07/26/2009   K 4.7 07/26/2009   CL 104 07/26/2009   CREATININE 0.7 07/26/2009   BUN 16 07/26/2009   CO2 26 07/26/2009   TSH 1.73 07/26/2009

## 2010-04-20 NOTE — Progress Notes (Signed)
Subjective:    Patient ID: [name], female    DOB: [birth year], 77 y.o.   MRN: [id number]  HPI here to f/u with family who relates pt still "picking" at her worsening chronic sores , especially to the right shoulder, now with 2-3 days onset red, swelling, tender area without drainage, fever or red streaks;  Pt denies chest pain, increased sob or doe, wheezing, orthopnea, PND, increased LE swelling, palpitations, dizziness or syncope.  Pt denies new neurological symptoms such as new headache, or facial or extremity weakness or numbness   Pt denies polydipsia, polyuria   Pt states overall good compliance with meds, helped by family.   Pt denies fever, wt loss, night sweats, loss of appetite, or other constitutional symptoms.   Denies worsening depressive symptoms, suicidal ideation, or panic, though has ongoing anxiety, some increased recently, but no worsening hallucinations, paranoia, or agitation Past Medical History  Diagnosis Date  . HYPERLIPIDEMIA 12/20/2006  . Senile dementia, uncomplicated 01/06/2009  . INSOMNIA-SLEEP DISORDER-UNSPEC 11/09/2006  . BLURRED VISION 06/29/2008  . HYPERTENSION 11/09/2006  . CORONARY ARTERY DISEASE 07/24/2006  . ASTHMATIC BRONCHITIS, ACUTE 01/14/2007  . ALLERGIC RHINITIS 11/09/2006  . GERD 06/04/2006  . CONSTIPATION 03/01/2009  . BACK PAIN 03/01/2009  . OSTEOPOROSIS 10/13/2009  . FATIGUE 01/21/2008  . CHEST PAIN 09/12/2007  . CEREBROVASCULAR ACCIDENT, HX OF 07/24/2006  . Goiter, unspecified 02/01/2010  . Cough 02/01/2010  . Asthma   . Chronic cough    Past Surgical History  Procedure Date  . Dilation and curettage of uterus     reports that she has never smoked. She does not have any smokeless tobacco history on file. She reports that she does not drink alcohol or use illicit drugs. family history includes Coronary artery disease in her other. Allergies  Allergen Reactions  . Metoclopramide Hcl   . Simvastatin    Current Outpatient Prescriptions on File Prior  to Visit  Medication Sig Dispense Refill  . albuterol (PROAIR HFA) 108 (90 BASE) MCG/ACT inhaler Inhale 2 puffs into the lungs 4 (four) times daily.        Marland Kitchen aspirin 81 MG tablet Take 81 mg by mouth daily.        . Cholecalciferol (VITAMIN D) 2000 UNITS CAPS Take by mouth daily.        Marland Kitchen donepezil (ARICEPT) 10 MG tablet Take 10 mg by mouth at bedtime as needed.        Marland Kitchen esomeprazole (NEXIUM) 40 MG capsule Take 40 mg by mouth daily before breakfast.        . fexofenadine (ALLEGRA) 180 MG tablet Take 180 mg by mouth daily as needed.        . fluticasone (FLONASE) 50 MCG/ACT nasal spray 2 sprays by Nasal route daily.        . memantine (NAMENDA) 5 MG tablet Take 5 mg by mouth daily.        . pravastatin (PRAVACHOL) 40 MG tablet Take 40 mg by mouth daily.        . traMADol-acetaminophen (ULTRACET) 37.5-325 MG per tablet Take 1 tablet by mouth every 6 (six) hours as needed.        . triamcinolone (KENALOG) 0.1 % cream Apply topically 2 (two) times daily.         Review of Systems All otherwise neg per pt     Objective:   Physical Exam BP 92/54  Pulse 72  Temp(Src) 98.4 F (36.9 C) (Oral)  Ht 5\' 2"  (1.575  m)  Wt 136 lb 8 oz (61.916 kg)  BMI 24.97 kg/m2  SpO2 96% Physical Exam  VS noted Constitutional: Pt appears well-developed and well-nourished.  HENT: Head: Normocephalic.  Right Ear: External ear normal.  Left Ear: External ear normal.  Eyes: Conjunctivae and EOM are normal. Pupils are equal, round, and reactive to light.  Neck: Normal range of motion. Neck supple.  Cardiovascular: Normal rate and regular rhythm.   Pulmonary/Chest: Effort normal and breath sounds normal.  Abd:  Soft, NT, non-distended, + BS Neurological: Pt is alert. No cranial nerve deficit.  Skin: Skin is warm. No erythema. except for right shoulder several small open shallow sores with surrounding red, tender without fluctuance or drainage Psychiatric: Pt behavior is normal. Thought content c/w dementia ,1+  nervous        Assessment & Plan:

## 2010-05-20 NOTE — Procedures (Signed)
[place]  Patient:    [name], [name]                          MRN: [id number] Proc. Date: 06/08/00 Adm. Date:  [id number] Disc. Date: [id number] Attending:  Nelda Marseille CC:         Corwin Levins, M.D. [facility]   Procedure Report  PROCEDURE:  Esophagogastroduodenoscopy with biopsy.  INDICATION:  Patient with a history of Barretts.  Due for repeat screening. Consent was signed after risks, benefits, methods, and options thoroughly discussed multiple times in the past.  MEDICATIONS:  Demerol 50 mg, Versed 5 mg.  DESCRIPTION OF PROCEDURE:  Video endoscope was inserted by direct vision.  The majority of the esophagus was normal.  No signs of reflux were seen in the distal esophagus, possibly with a short segment of Barretts, which was cold biopsied extensively at the end of the procedure.  The scope was advanced into the stomach and advanced through a normal antrum into a normal pylorus, into a normal duodenal bulb and around the C-loop to a normal second portion of the duodenum.  The scope was withdrawn back to the bulb, and a good look there ruled out ulcer in that location.  The scope was withdrawn back to the stomach and retroflexed.  High in the cardia, the changes of previous surgery were confirmed.  The fundus, angularis, lesser and greater curve were normal on retroflex visualization.  The scope was straightened, and straight visualization in the stomach was normal.  The scope was then slowly withdrawn back to 20 cm, which confirmed above findings.  The scope was then advanced to the distal esophagus.  Biopsies were obtained at this time.  Air was suctioned, scope removed.  The patient tolerated the procedure well.  There was no obvious immediate complication.  ENDOSCOPIC DIAGNOSES: 1. Status post hiatal hernia wrap. 2. Questionable short segment of Barretts, status post biopsy. 3. Otherwise within normal limits EGD.  PLAN:  Await  pathology.  She thinks the Nexium works better but gives her bad dreams, so we will decrease the dose to 20, maybe use it twice a day, and change her dose to night-time, as she has some a.m. problems, and continue workup with her colonoscopy.  See that dictation for details. DD:  06/08/00 TD:  06/10/00 Job: [id number]

## 2010-05-20 NOTE — Cardiovascular Report (Signed)
[name], [name]                   ACCOUNT NO.:  [id number]   MEDICAL RECORD NO.:  [id number]          PATIENT TYPE:  INP   LOCATION:  2020                         FACILITY:  [facility]   PHYSICIAN:  Thereasa Solo. Little, M.D. DATE OF BIRTH:  08-06-1930   DATE OF PROCEDURE:  11/30/2003  DATE OF DISCHARGE:                              CARDIAC CATHETERIZATION   INDICATIONS FOR TEST:  This 77 year old female had a three week history of  chest discomfort.  She was admitted on November 28, 2003 with low positive  cardiac enzymes, nonspecific anterior T-wave changes.  This is consistent  with a non Q-wave myocardial infarction.  She was placed on Aggrastat.   PROCEDURE:  After obtaining informed consent, the patient was prepped and  draped in the usual sterile fashion exposing the right groin.  Following  local anesthetic with 1% Xylocaine the Seldinger technique was employed and  a 5-French introducer sheath was placed into the right femoral artery.  Left  and right coronary arteriography, a hand injection in the left ventricle,  and evaluation of the left internal mammary artery was performed.   COMPLICATIONS:  None.   TOTAL CONTRAST:  35 mL.   EQUIPMENT:  5-French Judkins configuration catheters.   RESULTS:  HEMODYNAMIC MONITORING:  Central aortic pressure was 99/58.  Left ventricular pressure was 99/8 with  the left ventricular end-diastolic pressure being 12.   A hand injection into the left ventricle using 10 mL showed the posterior  basilar segment and the proximal inferior wall and the anterior basilar  segment have normal contractility.  The mid and distal septum and apex were  severely hypokinetic.  Ejection fraction was approximately 35%.  The end-  diastolic pressure was 12.   Internal mammary artery patent.   CORONARY ARTERIOGRAPHY:  There was dense calcification in the distribution of the left main and LAD.  1.  Left main terminal 80% narrowing.  2.  LAD:  The LAD had a 95%  ostial area of narrowing.  There was a small      first diagonal that had minimal irregularities.  The distal LAD had a      75% lesion.  The vessel overall was relatively small in diameter.  3.  Circumflex:  The circumflex had an ostial 80% area of narrowing.  The      first OM had an area of 80% narrowing.  The mid circumflex had an area      of 50% plus eccentric narrowing before the takeoff of the second OM      which was large.  This vessel was a codominant vessel.  4.  Right coronary artery small with a proximal third severely diseased with      80-90% sequential areas.  The PDA was small.   CONCLUSION:  1.  Decreased ejection fraction of 35% with the anterior and apical segments      being markedly hypokinetic to      akinetic.  This is probably more consistent with stunned myocardium.  2.  Severe three vessel coronary disease.   RECOMMENDATIONS:  Urgent  bypass surgery.       ABL/MEDQ  D:  11/30/2003  T:  11/30/2003  Job:  [id number]   cc:   Penni Bombard, MD  Fax: [phone]   Cath Lab

## 2010-05-20 NOTE — H&P (Signed)
NAMEELISHEBA, [name]                   ACCOUNT NO.:  [id number]   MEDICAL RECORD NO.:  [id number]          PATIENT TYPE:  INP   LOCATION:  1824                         FACILITY:  [facility]   PHYSICIAN:  Catherine A. Orlin Hilding, M.D.DATE OF BIRTH:  04/16/1930   DATE OF ADMISSION:  01/04/2005  DATE OF DISCHARGE:                                HISTORY & PHYSICAL   CHIEF COMPLAINT:  Confusion and staggering. This was called as a code stroke  with symptom onset at about 11 p.m. I was called at about quarter to twelve  on this at 2346.   The patient was accompanied by her husband and son. She is not really  certain that anything particular happened. Her husband said that she had  sudden onset of apparent confusion, seemed to be unable to use her hands on  either side, was just looking at her hands, seemed confused and was somewhat  giggly. When EMS came, she also was having trouble walking to the stretcher.  There were no lateralizing findings. Nobody felt that she was weaker on one  side than the other. Her symptoms have been improving since she got to the  emergency room. She is not quite back to baseline but is fairly close  according to her husband. No visual symptoms, aphasia, or numbness.   REVIEW OF SYSTEMS:  Positive for some reflux symptoms. No chest pain or  shortness of breath. No headache. She has occasional hiccups and is having  some currently but they are not sustained.   PAST MEDICAL HISTORY:  Significant for GERD, coronary artery disease status  post CABG in November of 2005, hypertension, hypercholesterolemia, obesity.  No diabetes. She has had hiatal hernia surgery.   MEDICATIONS:  1.  Nexium 40 milligrams once a day.  2.  Simvastatin 40 milligrams q.h.s.  3.  Metoprolol 50 milligrams twice a day.  4.  Diovan 40 milligrams daily.  5.  Aspirin 325 milligrams daily.   ALLERGIES:  No known drug allergies.   SOCIAL HISTORY:  She is married. 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 No alcohol. She has one son living and two deceased  children.   FAMILY HISTORY:  Positive for a mother with coronary artery disease. Father  with a stroke.   PHYSICAL EXAMINATION:  VITAL SIGNS:  Pulse is 79, respirations 15, blood  pressure 158/86.  HEENT:  Normocephalic and atraumatic.  NECK:  Supple without bruits.  HEART:  Regular rate and rhythm.  LUNGS:  Clear to auscultation.  ABDOMEN:  Obese and soft. Nontender.  EXTREMITIES:  Without edema. She has fat ankles.  NEUROLOGICAL:  Her NIH stroke scale was 4 at the beginning. When I  reevalauted her later it was 3.  She scored 2 points for getting the wrong  age and month on initial answers, although she was able to correct. She  scored 1 point each on lower extremity strength for minor drift. On the  repeat exam, she did not have any drift in the left but did still have a 1  drift on the  right. She had no finger-to-nose or heel-to-shin ataxia. There  was no aphasia. No dysarthria. No neglect. No cranial nerve abnormalities.  In addition to the NIH exam, she had 1+ reflexes with downgoing toes. Gait  was mildly unsteady but improved according to the husband.   CT of the brain was not acute. Mild small vessel disease. INR was 1.1,  creatinine 0.7. CK-MB is 1.0, troponin I is less than 0.05. Remaining labs  are pending.   IMPRESSION:  Possible stroke or transient ischemic attack, possibly  posterior circulation; however, due to the fairly nonfocal nature of the  exam with only mild lower extremity drift bilaterally, minimal confusion and  rapidly improving symptoms with a score of 4 on the NIH stroke scale, I do  not think she is a candidate for tPA or study involvement.   PLAN:  We will admit her to the stroke service. Check a MRA of the brain, MR  angiogram head and neck, 2-D echocardiogram, carotid Doppler, lipids, and  homocystine. We will continue aspirin for now.      Catherine A. Orlin Hilding,  M.D.  Electronically Signed     CAW/MEDQ  D:  01/04/2005  T:  01/04/2005  Job:  [id number]   cc:   Thereasa Solo. Little, M.D.  Fax: [phone]   Corwin Levins, M.D. [facility]  [address]  [place]  [place] [address]   Petra Kuba, M.D.  Fax: [phone]

## 2010-05-20 NOTE — Discharge Summary (Signed)
NAME:  [name], [name]                   ACCOUNT NO.:  [id number]   MEDICAL RECORD NO.:  [id number]          PATIENT TYPE:  INP   LOCATION:  6706                         FACILITY:  [facility]   PHYSICIAN:  Pramod P. Pearlean Brownie, MD    DATE OF BIRTH:  July 12, 1930   DATE OF ADMISSION:  01/04/2005  DATE OF DISCHARGE:  01/05/2005                                 DISCHARGE SUMMARY   ADMISSION DIAGNOSIS:  Stroke.   DISCHARGE DIAGNOSES:  1.  Vertebral basilar transient ischemic attack.  2.  Bilateral terminal vertebral artery stenosis.  3.  Hyperlipidemia.  4.  Hypertension.  5.  Coronary artery disease, status post coronary artery bypass grafting.   HOSPITAL COURSE:  [name] is a pleasant 77 year old Caucasian lady who was  brought to the hospital for sudden onset of confusion.  She was at home with  her family when she was found to have altered sensorium with some trouble  using her hands, not being able to coordinate them well.  She did not have  any focal lateralizing signs.  The code stroke was called when she came to  the emergency room.  Dr. Orlin Hilding evaluated the patient but there were no  lateralizing signs.  She apparently had some gait ataxia as per the EMS  people.  A CT scan of the head was obtained on admission which showed no  acute abnormality.  There was mild generalized atrophy and white matter  microangiopathic changes.  She was kept on telemetry monitoring, which did  not reveal any cardiac arrhythmia.  MRI scan of the brain was obtained the  next day which shows multiple areas of white matter hyperdensities and mild  generalized cerebral atrophy.  No acute infarct was noted.  There was  suggestion of possible old lacunar infarct in the right cerebellar  hemisphere.  MRA of the brain revealed high grade stenosis involving right  dominant right vertebral basilar junction as well as stenosis of nondominant  left vertebral basilar junction as well.  There was mild stenosis noted of  the  left ICA in the cavernous portion.  A 2-D echocardiogram  showed normal  ejection fraction without any obvious cardiac source of embolism.  A lipid  profile was normal.  Hemoglobin A1C was normal. Homocystine was normal.  Telemetry monitor does not reveal cardiac arrhythmias.  The patient was  started on Aggrenox for secondary stroke prevention and the side effects and  risks for headache was discussed with the patient.  She was advised  conservative medical  follow-up.  If she had recurrent posterior circulation  symptoms, consideration may be made for endovascular treatment for her  vertebral basilar occlusive disease. She was advised to keep herself well  hydrated.  She was advised to follow up with her primary doctor in a few  weeks and with Dr. Pearlean Brownie in his office in a month.  Her primary doctor is  Dr. Oliver Barre.   DISCHARGE MEDICATIONS:  1.  Aggrenox one capsule daily for two weeks to be increased to twice a day      if  tolerated.  2.  Nexium 40 mg a day.  3.  Zocor 40 mg a day.  4.  Metoprolol 50 twice a day.  5.  Diovan 40 mg daily.           ______________________________  Sunny Schlein. Pearlean Brownie, MD     PPS/MEDQ  D:  01/05/2005  T:  01/06/2005  Job:  [id number]   cc:   Corwin Levins, M.D. [facility]  [address]  [place]  [place] [address]

## 2010-05-20 NOTE — Op Note (Signed)
[name], [name]                   ACCOUNT NO.:  [id number]   MEDICAL RECORD NO.:  [id number]          PATIENT TYPE:  INP   LOCATION:  2312                         FACILITY:  [facility]   PHYSICIAN:  Salvatore Decent. Cornelius Moras, M.D. DATE OF BIRTH:  May 18, 1930   DATE OF PROCEDURE:  11/30/2003  DATE OF DISCHARGE:                                 OPERATIVE REPORT   PREOPERATIVE DIAGNOSIS:  Critical left main disease, three-vessel coronary  artery disease, status post acute non-Q-wave myocardial infarction, status  post transient bradycardic arrest with moderate left ventricular  dysfunction.   POSTOPERATIVE DIAGNOSIS:  Critical left main disease, three-vessel coronary  artery disease, status post acute non-Q-wave myocardial infarction, status  post transient bradycardic arrest with moderate left ventricular  dysfunction.   PROCEDURE:  Emergency median sternotomy for coronary artery bypass grafting  x3 (left internal mammary artery to distal left anterior descending coronary  artery, saphenous vein graft to the third circumflex marginal branch,  saphenous vein graft to distal right coronary artery, and endoscopic  saphenous vein harvest from right thigh).   SURGEON:  Salvatore Decent. Cornelius Moras, M.D.   ASSISTANT:  Sheliah Plane, M.D.   SECOND ASSISTANT:  Carmin Muskrat. Eustaquio Boyden.   ANESTHESIA:  General.   BRIEF CLINICAL NOTE:  The patient is a 77 year old female who presents with  an acute non-Q-wave myocardial infarction.  She underwent cardiac  catheterization earlier in the day on 11/28 by Dr. Caprice Kluver.  Findings  include critical left main disease, three-vessel coronary artery disease,  and moderate left ventricular dysfunction.  The patient suffered a transient  bradycardic arrest early following her catheterization.  She was taken back  to the catheterization lab for possible intra-aortic balloon pump placement,  but abdominal aortogram demonstrated severe peripheral vascular disease.  The  patient remained stable after her initial resuscitation from her  transient arrest.  She is now brought directly to the operating room for  emergency surgical revascularization.   OPERATIVE CONSENT:  The patient's husband has been counseled regarding the  need to proceed directly to emergency surgery for treatment of her  underlying coronary artery disease.  Alternative treatment strategies have  been discussed.  He understands somewhat increased risks associated with the  circumstances at home.  He understands and accepts all associated risks of  surgery, including but not limited to risk of death, stroke, myocardial  infarction, congestive heart failure, respiratory failure, pneumonia,  bleeding requiring blood transfusion, arrhythmia, infection, and recurrent  coronary artery disease.  All of his questions have been addressed.   OPERATIVE NOTE IN DETAIL:  The patient is brought directly from the cardiac  catheterization laboratory to the operating room in the afternoon of  November 28.  She is placed in the supine position on the operating table.  Central monitoring is established by the anesthesia service under the care  and direction of Dr. Bedelia Person.  Intravenous antibiotics are administered.  Following induction with general endotracheal anesthesia, a Foley catheter  is placed.  The patient's chest, abdomen, both groins, and both lower  extremities are prepared and draped  in a sterile manner.   Baseline transesophageal echocardiogram is performed by Dr. Gypsy Balsam.  This  demonstrates the presence of moderate to severe left ventricular  dysfunction.  The distal anterior wall and apex on the left ventricle are  severely hypokinetic.  The intraventricular septum is dyskinetic.  The left  ventricle is moderately dilated.  There is mild to moderate mitral  regurgitation which consists of a short, relatively narrow central jet of  mitral regurgitation.  The aortic valve appears normal.  No  other  significant abnormalities are appreciated.   A median sternotomy incision is performed, and the left internal mammary  artery is dissected from the chest wall and prepared for bypass grafting.  The left internal mammary artery is a good quality conduit.  Simultaneously,  saphenous vein is obtained from the patient's right thigh using endoscopic  vein harvest technique.  The saphenous vein is slightly large caliber but  otherwise good quality.  The patient is heparinized systemically.  After the  saphenous vein is removed from the right thigh, the small surgical incisions  are closed in multiple layers with running absorbable suture.  The  pericardium is opened.  The ascending aorta is normal in appearance.  The  ascending aorta and the right atrium are cannulated for cardiopulmonary  bypass.  A retrograde cardioplegia catheter is placed through the right  atrium into the coronary sinus.  Adequate heparinization is verified.   Cardiopulmonary bypass is begun, and the surface of the heart is inspected.  Distal sites are selected for coronary bypass grafting.  There is codominant  coronary circulation.  The distal right coronary artery is relatively small.  The terminal circumflex marginal branch (circumflex marginal #3) is the  dominant coronary artery perfusing the inferior wall of the left ventricle  and posterolateral wall.  The left anterior descending coronary artery also  wraps around the apex of the heart and perfuses the distal inferior wall  near the apex.  The second circumflex marginal branch is too small for  bypass grafting.  The left anterior descending coronary artery is  intramyocardial during most of its course down the anterior wall of the left  ventricle.  A cardioplegia catheter is placed in the ascending aorta.  A  temperature probe is placed in the left ventricular septum.   The patient is allowed to cool passively to 32 degrees systemic temperature. The  aortic crossclamp is applied, and cardioplegia is delivered initially in  the antegrade fashion through the aortic root.  Supplemental cardioplegia is  administered retrograde to the coronary sinus catheter.  Iced saline slush  is applied for topical hypothermia.  The initial cardioplegic arrest and  myocardial cooling are felt to be excellent.  Repeat doses of cardioplegia  are administered intermittently throughout the crossclamp portion of the  operation both through the aortic root, down subsequently placed vein  grafts, and retrograde to the coronary sinus catheter to maintain septal  temperature of below 15 degrees centigrade.  The following distal coronary  anastomoses are then performed:   1.  The third circumflex marginal branch (posterolateral branch) is grafted      with a saphenous vein graft in the end-to-side fashion.  This coronary      measures 1.5 mm in diameter and is of good quality at the site of the      distal bypass.  2.  The distal right coronary artery is grafted with a saphenous vein graft      in an end-to-side  fashion.  This coronary measures 1.5 mm in diameter at      the site of the distal bypass and is of good quality.  3.  The distal left anterior descending coronary artery is grafted with a      left internal mammary artery in an end-to-side fashion.  This vessel is      intramyocardial.  At the site of the distal bypass, it measures 1.5 mm      in diameter.  Beyond this, the vessel becomes smaller and is somewhat      diffusely diseased.  Following completion of this distal anastomosis,      there is some bleeding appreciated from a small side branch of the left      internal mammary artery.  An initial attempt to repair this is felt to      be fought with danger concerning possible impingement of the graft      itself, and the graft is taken down and then performed a second time      after trimming the internal mammary artery back above the level of the       side branch.   Both proximal saphenous vein anastomoses are performed directly to the  ascending aorta prior to removal of the aortic crossclamp.  The left  ventricular septal temperature is noted to rise rapidly following  reperfusion of the left internal mammary artery.  One final dose of warm  retrograde __________ cardioplegia is administered.  The aortic crossclamp  is removed after a total crossclamp time of 106 minutes.   The heart begins to beat spontaneously without need for cardioversion.  All  proximal and distal coronary anastomoses are inspected for hemostasis and  appropriate graft orientation.  Epicardial pacing wires are affixed to the  right ventricular outflow tract and to the right atrial appendage.  The  patient is rewarmed to 37 degrees centigrade temperature.  The low-dose  dopamine infusion is begun.  Low-dose milrinone infusion is begun initially  with a loading dose as determined per anesthesia.  The patient is weaned from cardiopulmonary bypass successfully after a total  cardiopulmonary bypass time of 129 minutes.  At the time of separation from  bypass, the patient's rhythm is normal sinus rhythm.  The patient is weaned  from bypass on low-dose dopamine, and milrinone is added.  The patient is  noted to have persistent moderate to severe hypokinesis of the distal and  anterior wall and apex as well as persistent dyskinesis of the septum.  Left  ventricular function appears to gradually improve during the post-bypass  portion of the operation.  Initially, there is some lability of blood  pressure, but this responds with volume administration without further  significant problems.  The venous and arterial cannulae are removed  uneventfully.  Protamine is administered to reverse the anticoagulation.  Followup transesophageal echocardiogram performed by Dr. Gypsy Balsam demonstrates  findings as noted previously.  The mitral regurgitation continues to improve  and  is felt to be mild by completion of the operation.  No other  abnormalities are noted.   The mediastinum and the left chest are irrigated with saline solution,  containing vancomycin.  Meticulous surgical hemostasis is ascertained.  The  mediastinum and the left chest are drained with three chest tubes placed  through separate stab incisions inferiorly.  The median sternotomy is closed  in a routine fashion.  The soft tissues anterior to the sternum are closed  in multiple layers, and the  skin is closed with skin staples.   The patient tolerated the procedure reasonably well.  All sponge,  instrument, and needle counts are verified and correct at the completion of  the operation.  The patient received a total of 3 units of packed red blood  cells during cardiopulmonary bypass due to anemia which was present  preoperatively and exacerbated by surgery.   The patient was transported directly to the surgical intensive care unit.  Immediately following arrival in the surgical intensive care unit, the  patient developed bradycardia and hypotension.  Simultaneously, the patient  was noted to be profoundly hypoxic with O2 saturations less than 60%.  The  patient was manually ventilated on 100% FIO2, and portable chest x-ray  demonstrated that the endotracheal tube had migrated down the right main  stem bronchus.  Blood pressure and heart rate promptly responded to a light dose of  intravenous epinephrine, and oxygen saturations quickly returned to normal  after reposition of the endotracheal tube.  The patient was subsequently  left in the intensive care unit in stable condition.       CHO/MEDQ  D:  11/30/2003  T:  12/01/2003  Job:  [id number]   cc:   Thereasa Solo. Little, M.D.  [address]  [address]  [place]  [place] [address]  Fax: [phone]   Penni Bombard, MD  Fax: [phone]

## 2010-05-20 NOTE — Op Note (Signed)
NAMESHAWNAY, BRAMEL                   ACCOUNT NO.:  [id number]   MEDICAL RECORD NO.:  [id number]          PATIENT TYPE:  AMB   LOCATION:  ENDO                         FACILITY:  [facility]   PHYSICIAN:  Petra Kuba, M.D.    DATE OF BIRTH:  [birth year]   DATE OF PROCEDURE:  12/02/2004  DATE OF DISCHARGE:                                 OPERATIVE REPORT   PROCEDURE:  EGD with biopsy.   INDICATIONS FOR PROCEDURE:  Increased upper tract symptoms. Consent was  signed after risks, benefits, methods, options thoroughly discussed multiple  times in the past.   MEDICINES USED:  Demerol 25, Versed 3.   PROCEDURE:  The video endoscope was inserted by direct vision. Esophagus was  normal. No signs of esophagitis. She did have the hiatal hernia repair which  has become loosened and there is a small hiatal hernia.  Residual scope  passed easily into the stomach, advanced through the antrum where some mild  to moderate antritis was seen, advanced through a normal pylorus into a  normal duodenal bulb and around the C-loop to a normal second portion of the  duodenum.  Scope was slowly withdrawn back to the bulb.  A good look there  confirmed its normal appearance. Scope was drawn back in the stomach and  retroflexed high in the cardia.  The  hiatal hernia surgery changes were  confirmed.  The fundus, angularis, lesser and greater curve were normal  except for minimal amount of gastritis. Scope was straightened. Straight  visualization of the stomach confirmed the antritis greater than gastritis,  ruled out any additional lesions.  Scope was slowly withdrawn back to 20 cm  and confirming the normal esophagus. Scope was re-advanced to the antrum.  A  few biopsies of the antrum and to the proximal stomach were obtained and put  in the first container and then we took a few biopsies of the esophagus,  proximal and distal, to rule out any microscopic problems, put them in a  separate container.  The scope was  then slowly withdrawn. The patient  tolerated the procedure well. There was no obvious immediate complication.   ENDOSCOPIC DIAGNOSES:  1.  Status post hiatal hernia repair with minimal loosening.  2.  Normal esophagus, status post biopsy.  3.  Antritis greater than mild gastritis status post biopsy.  4.  Otherwise normal EGD.   PLAN:  Await pathology. Will review chart to see if we tried Zelnorm but may  try that next and follow-up in two to three months or p.r.n.           ______________________________  Petra Kuba, M.D.     MEM/MEDQ  D:  12/02/2004  T:  12/02/2004  Job:  [id number]

## 2010-05-20 NOTE — Op Note (Signed)
[name], [name]                   ACCOUNT NO.:  [id number]   MEDICAL RECORD NO.:  [id number]          PATIENT TYPE:  AMB   LOCATION:  DSC                          FACILITY:  [facility]   PHYSICIAN:  Rodney A. Mortenson, M.D.DATE OF BIRTH:  03/25/1930   DATE OF PROCEDURE:  07/21/2005  DATE OF DISCHARGE:                                 OPERATIVE REPORT   PREOPERATIVE DIAGNOSIS:  Comminuted fracture right distal radius with  displacement.   POSTOPERATIVE DIAGNOSIS:  Comminuted fracture right distal radius with  displacement.   OPERATION:  Open reduction internal fixation using volar fixation plate,  right wrist.   SURGEON:  Rodney A. Chaney Malling, MD   ASSISTANT:  Legrand Pitts. Duffy, PA   ANESTHESIA:  Axillary block.   PROCEDURE:  The patient placed on the operating table in a supine position  with attention paid to the right upper arm.  The right upper extremity was  prepped with DuraPrep and draped down in the usual manner.  The arm was then  wrapped with Esmarch and tourniquet was elevated.  Incision made along the  course of flexor carpi radialis.  Skin anteverted and retracted.  The flexor  carpi radialis tendon sheath was then opened.  The tendon was then retracted  along the border of the arm.  The medial nerve was identified and isolated  and brought along with the flexor carpi radialis tendon.  The volar sheath  of flexor carpi radialis was then opened and care was taken to avoid any  injury to the radial arteries.  The pronator quadratus could clearly be  seen.  The FPL was retracted out of the way.  Incision made through the  pronator and then it was swept to the ulnar side of the forearm exposing the  radius and the fracture.  Excellent visualization was achieved.  Self-  retaining retractors put in position.  The wrist was manipulated and an  almost anatomic reduction was achieved.  A standard size volar plate was  then selected and placed in position.  A drill hole was placed  through the  proximal plate and screw was inserted and the distal end of the plate was  adjusted to be at the waterfall line.  Position was confirmed on x-rays  taken throughout the procedure.  Using the guides in the distal plate, the  holes were drilled.  The quick guides removed.  The holes were measured and  appropriate smooth pegs were inserted.  All peg holes were used.  X-rays  were taken, AP lateral and oblique views, which showed almost anatomic  reduction of the fracture with good position of all the pegs.  No intra-  articular violation occurred.  A second screw was placed in the proximal  plate, so there was good fixation proximally and distally.  Once this was  accomplished, the pronator quadratus was then sutured back down into  anatomic position with 2-0 Vicryl.  Interrupted 4-0 nylon was then used to  close the skin.  Sterile dressings and wrist immobilizer applied and the  patient returned to the recovery room  in excellent condition.   DIAGNOSIS:  The procedure went extremely well.   FOLLOW-UP CARE:  1.  Vicodin for pain.  2.  Wrist immobilizer.  3.  Return to my office on Wednesday for follow-up exam.  4.  Keep hand elevated.           ______________________________  Lenard Galloway. Chaney Malling, M.D.     RAM/MEDQ  D:  07/21/2005  T:  07/22/2005  Job:  [id number]

## 2010-05-20 NOTE — Cardiovascular Report (Signed)
[name], [name]                   ACCOUNT NO.:  [id number]   MEDICAL RECORD NO.:  [id number]          PATIENT TYPE:  INP   LOCATION:  2020                         FACILITY:  [facility]   PHYSICIAN:  [name], M.D. DATE OF BIRTH:  [birth year]   DATE OF PROCEDURE:  11/30/2003  DATE OF DISCHARGE:                              CARDIAC CATHETERIZATION   [name] is a 77 year old female who had had a cardiac catheterization  approximately one hour earlier.  She had severe critical left main and  ostial LAD and circumflex disease and was planning to go for urgent bypass  surgery today.  She was taken into the holding room following her cardiac  catheterization and had a bradycardic arrest.  This was not in the setting  of having her arterial sheath removed.  She was given 1 amp of atropine, and  her blood pressure and her rate stabilized to 130/60 in sinus tachycardia at  110.  The patient was bagged for quite a while but never had to be  intubated.   The EKG following this event showed nonspecific ST-T wave changes in the  anterior leads that were slightly more than noted on November 29, 2003.   Because of this, she was brought back to the catheterization lab for  intraaortic balloon placement.   The previously placed 6 French sheath was changed out for a sterile 6  [place], and a distal aortogram using 20 cc of contrast was performed.  In  the infrarenal area, she had a small abdominal aortic aneurysm with marked  irregularities in the distal aorta by the bifurcation.  Because of the  anatomy of the distal aorta, the decision not to place an intraaortic  balloon was made.   A 6 French introducer sheath was placed into the right femoral vein, and a  temporary pacemaker was placed in the apex of the right ventricle, with good  capture to an MA of 1.0.   The patient is currently pain free, hemodynamically stable, and headed to  the operating room for emergency surgery by Dr.  Cornelius Ray.       ABL/MEDQ  D:  11/30/2003  T:  11/30/2003  Job:  [id number]

## 2010-05-20 NOTE — Discharge Summary (Signed)
NAMEALOHA, BARTOK                   ACCOUNT NO.:  [id number]   MEDICAL RECORD NO.:  [id number]          PATIENT TYPE:  INP   LOCATION:  2010                         FACILITY:  [facility]   PHYSICIAN:  Salvatore Decent. Cornelius Moras, M.D. DATE OF BIRTH:  04/23/1930   DATE OF ADMISSION:  11/28/2003  DATE OF DISCHARGE:  12/10/2003                                 DISCHARGE SUMMARY   ADMISSION DIAGNOSES:  Non-Q-wave myocardial infarction.   DISCHARGE/SECONDARY DIAGNOSES:  1.  Non-Q-wave myocardial infarction.      1.  Critical left main disease.      2.  Three-vessel coronary artery disease.      3.  Status post coronary artery bypass grafting.  2.  Postoperative coagulopathy, resolved.  3.  Postoperative anemia, status post transfusion.  4.  Postoperative hyponatremia, most likely secondary to free water excess      with generalized fluid overload.  Responsive to fluid restriction.  5.  Dyslipidemia.  6.  Diabetes mellitus type 2, newly diagnosed.  7.  Gastroesophageal reflux disease with history of hiatal hernia.   PROCEDURES:  1.  November 30, 2003 -- Emergency median sternotomy for coronary artery      bypass grafting x3 (performed by Dr. Tressie Stalker).      1.  Left internal mammary artery to the distal left anterior descending          coronary artery.      2.  Saphenous vein graft to the third circumflex marginal branch.      3.  Saphenous vein graft to the distal right coronary artery.      4.  Endoscopic saphenous vein harvesting from the right thigh.  2.  November 30, 2003 -- Intraoperative transesophageal echocardiogram;      performed by  Bedelia Person, M.D.  1.  November 30, 2003 -- Cardiac catheterization performed by Gaspar Garbe B.      Little, M.D.  Showing critical left main disease and three-vessel      coronary artery disease.  Ejection fraction estimated at 35%.   ALLERGIES:  NO KNOWN DRUG ALLERGIES.   BRIEF HISTORY:  Ms. Politte is a 77 year old female who presented to [name] emergency department on November 28, 2003 with complaints of chest  pain.  Apparently this had been going on for approximately three weeks.  Cardiac enzymes were low positive, with EKG showing nonspecific anterior T-  wave changes.  This is felt consistent with a non-Q-wave myocardial  infarction.  After treatment in the emergency department she was free of  chest pain.  However, it is felt she should be admitted for further  management, with plans for cardiac catheterization.   HOSPITAL COURSE:  On November 28, 2003 Ms. Kunkler was admitted to [facility], after suffering a non-Q-wave myocardial infarction.  She was  initially treated with nitroglycerin, heparin, aspirin and beta blockers.  She was scheduled for heart catheterization two days later by Dr. Julieanne Manson.  This was performed as scheduled, with results as discussed above.   Due to her critical left main  disease, it was felt that she should undergo  emergent coronary artery bypass grafting.  Dr. Tressie Stalker of [facility] was  consulted.  Of note, the patient suffered transient bradycardic attack  following her catheterization.  She was taken back to the catheterization  lab for possible intra-aortic balloon pump placement; however, abdominal  aortogram demonstrated severe peripheral vascular disease.  She remained  stable after initial resuscitation from her transient arrest, but was  brought directly to the operating room for emergency surgical  revascularization.  Dr. Cornelius Moras did speak with the patient and her husband just  prior to surgery, and discussed the risks, benefits and alternatives; she  did agree to proceed.   As discussed earlier, Ms. Wax did undergo emergent coronary artery bypass  grafting x3.  Off of cardiopulmonary bypass, she was on low-dose dopamine  and __________.  She was transfused with three units of packed red blood  cells and transferred to the surgical intensive care unit in stable   condition.   However, immediately upon arrival to the surgical intensive care unit, she  developed transient hypotension and bradycardia -- which responded to  temporary pacing and IV epinephrine.  She was also noted to be profoundly  hypoxemic upon arrival, with oxygen saturations around 60%.  Her pO2 was  around 30%.  A portable chest x-ray noted right mainstem bronchus intubation  with an endotracheal tube, and left lung atelectasis.  The endotracheal tube  was pulled back, with prompt recovery.   Later that evening she remained sedated on the ventilator, and then in sinus  rhythm.  Her oxygen saturations were now 100%, with equal breath sounds.  Chest tube output remained low and urine output adequate.  Repeat chest x-  ray showed re-expansion of the left lung, with tubes in line and good  position.   By the morning of postoperative day #1, Ms. Schaff was sleepy but arousable.  Her oxygen saturation remained good and it was felt she was stable to begin  weaning the vent.  She remained on dopamine and __________ drip.   Her labs showed evidence of a mild thrombocytopenia and anemia.  She was  transfused with fresh frozen plasma, as well as platelets and packed red  cells.  She showed some evidence of volume excess, and diuresis was  initiated.   Over the next few days Ms. Schmuhl made positive but somewhat slow progress.  Of  note, postoperative capillary blood glucose readings were elevated (up to  180).  This was treated with sliding-scale insulin.  Arrangements were also  eventually made for her to undergo outpatient diabetes teaching.   On postoperative day #1 she had good wean from the vent, and was  neurologically intact.  Her saturations now remained 97-100% on 2 L nasal  cannula.  Chest tube output remained low, and these were discontinued  without incident.  By postoperative day #2 she is felt stable to transfer out to the Floor.  While on Unit 2000 she continued to progress.   Her labs showed improvement  of blood and platelet counts.  However, she did indicate mild hypokalemia,  which was supplemented.  Her labs also indicated hyponatremia, which was  felt most likely secondary to free water excess with generalized fluid  volume overload.  She was placed on 1 L fluid restriction and was gradually  discontinued from diuretic therapy, as her weight reached her baseline.  Her  lowest sodium postoperatively was 121, but prior to discharge had increased  to  131.   While on Unit 2000 Ms. Frediani also began mobilization with cardiac  rehabilitation.  Day-to-day she did increase her distance, but was felt to  require rolling walker; this was arranged for home.  It was also felt that  she would require physical therapy and occupational therapy once discharged;  this was arranged as well.   By the time Ms. Cartaya had been transferred to Unit 2000 she had been weaned  from the __________ and dopamine.  Her lines were discontinued in usual  fashion.  She maintained sinus rhythm, with her rate ranging between 50-  90's.  Her blood pressure was stable, with systolic readings ranging between  100-110; diastolic readings ranged from the 50-60's.  She remained afebrile.  She was weaned from supplemental oxygen and was saturating greater than 90%.  Her capillary blood glucose readings were fairly controlled, mainly ranging  in the 120's on sliding-scale insulin and a carbohydrate-modified diet.   PHYSICAL EXAMINATION:  CARDIAC:  Heart had a regular rate and rhythm.  LUNGS:  Clear.  ABDOMEN:  Benign.  EXTREMITIES:  Without edema.  Her incisions were clean and dry without  erythema. She did have some right lower extremity ecchymosis which was  improving.  SKIN:  Her buttocks did show a small rash, which was felt most likely  medication reaction.  This was treated with hydrocortisone cream.   By postoperative day #8 she was felt nearly ready for discharge home.  Her  examination  was as described above.  Her sodium level was increasing.  Her  bowel and bladder functions work appropriately.  Her pain was controlled on  oral medication.  Her ambulation continued to improve.  Therefore, it was  felt that if she continued to make good progress for the next 24 hr, that  she would be ready for discharge home on postoperative day #9 (December 10, 2003).  Official discharge orders will be written, pending no significant  changes in status overnight.  Home health nurse and bath aide were arranged  prior to discharge, per cardiac surgery protocol.  Her external pacing wires  were also discontinued.   LABORATORY/RADIOLOGY:  Chest x-ray on December 05, 2003 showed improved  aeration with small bilateral effusions and basilar atelectasis.  Her most  recent labs showed:  Sodium 131, potassium 3.7, chloride 98, CO2 28, blood glucose 100, BUN 7, creatinine 0.7, calcium 8.3.  White blood count 11.3,  hemoglobin 13.2, hematocrit 37.6, platelet count 368.  Total bilirubin 0.6,  alkaline phosphatase 92, SGOT 45, SGPT 17, total protein 5.9, albumin 3.3.  TSH 3.117.  Total cholesterol 233.  Triglycerides 195.  HDL 36, LDL 158.   DISCHARGE MEDICATIONS:  1.  Enteric-coated aspirin 325 mg one p.o. q.d.  2.  Lopressor 50 mg one p.o. b.i.d.  3.  Altace 2.5 mg one p.o. b.i.d.  4.  Zocor 40 mg one p.o. q.p.m.  5.  Nexium 20 mg one p.o. q.d.  6.  Ultram 50 mg 1-2 tablets p.o. q.4-6h. p.r.n. pain.   DISCHARGE ACTIVITIES:  Instructed to avoid driving or heavy lifting more  than 10 pounds.  She is encouraged to continue daily walking and exercise.   DISCHARGE DIET:  She is to follow a low fat diet, with continued fluid  restriction of 1000 mL per day, until seen by  Dr. Cornelius Moras.  She should also continue to modify her carbohydrate intake.   WOUND CARE:  She may shower daily and clean her incisions gently with  mild  soap and water.   SPECIAL INSTRUCTIONS:  She is to notify the [facility] office if  she develops  fever greater than 101, redness or drainage from her incision site.  Home  health nurse, physical therapist, occupational therapist and bath aid have  been arranged through [facility].   FOLLOW UP:  1.  She is to follow up with Salvatore Decent. Cornelius Moras, M.D. in the [facility] office on      Monday, December 21, 2003 at 2:15 p.m.  She is to have a chest x-ray at      the [facility] at 1:15 p.m. that same day, and was      instructed to bring her chest x-ray film with her to the appointment.  2.  She is to call [phone] to schedule two-week followup with Dr. Clarene Duke.  3.  She is to call to schedule three-week followup with Dr. Jonny Ruiz (her family      doctor), to re-evaluate her blood sugar.      Alli   AWZ/MEDQ  D:  12/09/2003  T:  12/10/2003  Job:  [id number]

## 2010-05-20 NOTE — Consult Note (Signed)
NAMEADALYNE, LOVICK                   ACCOUNT NO.:  [id number]   MEDICAL RECORD NO.:  [id number]          PATIENT TYPE:  INP   LOCATION:  2312                         FACILITY:  [facility]   PHYSICIAN:  Salvatore Decent. Cornelius Moras, M.D. DATE OF BIRTH:  [birth year]   DATE OF CONSULTATION:  12/01/2003  DATE OF DISCHARGE:                                   CONSULTATION   REASON FOR CONSULTATION:  Critical left main disease, three vessel coronary  artery disease, status post acute non-Q-wave myocardial infarction.   HISTORY OF PRESENT ILLNESS:  Mrs. Rinks is a 77 year old married white female  from [place] with no previous history of coronary artery disease and risk  factors only notable in that the patient's mother suffered from heart  disease in the past.  Apparently, the patient was in her usual state of  health until Thanksgiving day when she first began to develop pressure-like  substernal chest pain radiating across her entire chest and extending to her  back. The pain waxed and waned for 48 hours, ultimately prompting her to  return to the emergency room on November 28, 2003.   There, her electrocardiogram was notable for nonspecific EKG changes but  cardiac enzymes were positive and notable for non-Q-wave myocardial  infarction.  Baseline electrocardiogram did demonstrate Q-waves in V1 and V2  as well as large T-wave inversion suggesting a previous septal myocardial  infarction.  The patient was stabilized medically and admitted to the  hospital by Dr. Gaspar Garbe B. Little.  She was taken for elective cardiac  catheterization on November 30, 2003.  Findings at the time of  catheterization are notable for 80% stenosis of the distal left main  coronary artery with critical 99% ostial stenosis of the left anterior  descending artery and three vessel coronary artery disease with moderate  left ventricular dysfunction.  Although the patient tolerated  catheterization initially quite well, shortly after  being taken to the  cardiac catheterization lab holding area, she developed profound bradycardia  and hypotension which responded to intravenous Atropine.  Emergent cardiac  surgical consultation was requested.   REVIEW OF SYMPTOMS:  At the time of consultation, the patient is in the  cardiac catheterization lab on the table.  She is responsive but somewhat  sedated.  She denies ongoing chest pain.  The remainder of her review of  systems has been outlined in her previous admission history and physical  exam on November 28, 2003 and apparently is fairly unremarkable.   PAST MEDICAL HISTORY:  1.  Notable for a history of GE reflux disease.  2.  As well as questionable history of asthma and bronchitis.   PAST SURGICAL HISTORY:  None.   FAMILY HISTORY:  The patient's father and mother both had heart disease in  the their 58s.   SOCIAL HISTORY:  The patient is married with three children, although only  one is alive at this time.  She lives with her husband and she is retired.  There is no history of tobacco or significant alcohol use.   MEDICATIONS:  Nexium.   ALLERGIES:  None known.   PHYSICAL EXAMINATION:  GENERAL:  The patient is lying on the cardiac  catheterization table where intra-aortic balloon pump placement is being  attempted by Dr. Gaspar Garbe B. Little.  At present, she is in normal sinus  rhythm with stable blood pressure.  LUNGS:  Breath sounds are clear anteriorly.  The remainder of her physical  exam is deferred for the time being.  NEUROLOGICAL:  Reportedly, she has been completely alert and neurologically  intact throughout.  VITAL SIGNS:  Her vital signs responded quickly to intravenous Atropine when  she suffered her brief bradycardic arrest.   PROCEDURE:  Cardiac catheterization performed by Dr. Gaspar Garbe B. Little is  reviewed.  This demonstrates critical left main disease and three vessel  coronary artery disease.  There is 80-90% distal narrowing of the left  main  coronary artery.  There is 95-99% ostial stenosis of the left anterior  descending coronary artery.  There is some diffuse in the distal left  anterior descending coronary artery.  There is ostial 80% stenosis of the  left circumflex coronary artery with codominant coronary circulation.  There  is 90% stenosis in the midright coronary artery.  The right coronary artery  is relatively small vessel.  Left ventricular function is moderately reduced  with an ejection fraction estimated at 35%.  There is severe hypokinesis or  akinesis of the distal anterior wall and apex.   IMPRESSION:  Critical left main disease and three vessel coronary artery  disease, status post acute non-Q-wave myocardial infarction now status post  transient bradycardic arrest early following catheterization.  The patient  has moderate left ventricular dysfunction with critical coronary anatomy.  I  agree with plans for intra-aortic balloon pump placement followed by  proceeding directly to the operating room for emergent surgical  revascularization.   PLAN:  The plan will be to proceed with surgery as outlined.       CHO/MEDQ  D:  11/30/2003  T:  12/01/2003  Job:  [id number]   cc:   Thereasa Solo. Little, M.D.  [address]  [address]  [place]  [place] [address]  Fax: [phone]   Penni Bombard, MD  Fax: [phone]

## 2010-05-20 NOTE — Procedures (Signed)
. [facility]  Patient:    [name], [name]                          MRN: [id number] Proc. Date: 06/08/00 Adm. Date:  [id number] Disc. Date: [id number] Attending:  Nelda Marseille CC:         Corwin Levins, M.D. [facility]   Procedure Report  PROCEDURE:  Colonoscopy with polypectomy.  INDICATION:  Patient due for colonic screening.  Consent was signed after risks, benefits, methods, and options thoroughly discussed multiple times in the past.  MEDICATIONS:  Additional medicines for this procedure, since it followed the endoscopy, were Demerol 20 mg, Versed 2 mg.  DESCRIPTION OF PROCEDURE:  Rectal inspection was pertinent for small external hemorrhoids.  Digital exam was negative.  The pediatric video colonoscope was inserted with mild difficulty due to a sigmoid that was very tortuous.  Once through this area, we were able to advance to the cecum.  This did require some abdominal pressure but no position changes.  On insertion, no obvious abnormality was seen.  The cecum was identified by the appendiceal orifice and the ileocecal valve.  In fact, the scope was advanced a short way into the terminal ileum, which was normal.  Photo documentation was obtained.  The scope was slowly withdrawn.  In the cecum, a tiny polyp was seen, which was cold biopsied x 1, and in the ascending, a 2-3 mm polyp was seen and was hot biopsied x 1.  Both were put in the same container.  Prep was adequate.  There was some liquid stool that required washing and suctioning, but on slow withdrawal through the colon no abnormalities were seen.  When we fell back around a tortuous loop, we did try to readvance around the curves to decrease the chance of missing things.  Her most tortuous area was the distal sigmoid. No other lesions were seen.  Once back in the rectum, the scope was retroflexed, revealing some internal hemorrhoids.  The scope was straightened, air was withdrawn, and  the scope removed.  The patient tolerated the procedure well.  There was no obvious immediate complication.  ENDOSCOPIC DIAGNOSES: 1. Internal-external hemorrhoids. 2. Tortuous sigmoid. 3. Tiny cecal polyp, cold biopsied. 4. Ascending small polyp, not biopsied. 5. Otherwise within normal limits to the terminal ileum.  PLAN:  Await pathology to determine future colonic screening but probably recheck in five years.  Will put her on the one-week no aspirin or nonsteroidal restrictions and see her back p.r.n. or in six months to recheck reflux and symptoms and make sure no further workup plans are needed.  Happy to see back sooner p.r.n. DD:  06/08/00 TD:  06/10/00 Job: [id number]

## 2010-05-20 NOTE — Op Note (Signed)
[name], [name]                             ACCOUNT NO.:  [id number]   MEDICAL RECORD NO.:  [id number]                   PATIENT TYPE:  AMB   LOCATION:  ENDO                                 FACILITY:  [facility]   PHYSICIAN:  Petra Kuba, M.D.                 DATE OF BIRTH:  05/12/1930   DATE OF PROCEDURE:  06/26/2002  DATE OF DISCHARGE:                                 OPERATIVE REPORT   PROCEDURE:  EGD with biopsy.   INDICATION:  Patient with questionable history of Barrett's, due for repeat  screening.  Consent was signed after risks, benefits, methods, and options  thoroughly discussed in the office on multiple occasions.   MEDICINES USED:  1. Demerol 50.  2  Versed 4.   DESCRIPTION OF PROCEDURE:  Video endoscope was inserted by direct vision.  The proximal and mid esophagus was normal.  In the distal esophagus, was a  small hiatal hernia despite her previous repair.  There were no signs of  obvious Barrett's.  The scope was advanced into the stomach, advanced  through a normal antrum, normal pylorus, into a normal duodenal bulb, and  around the C-loop to a normal second, third, and probably even fourth part  of the duodenum.  The scope was slowly withdrawn back to the bulb.  A good  look there ruled out ulcer in that location.  Scope was withdrawn back to  the stomach and retroflexed.  High in the cardia, surgical changes were  confirmed.  The fundus, angularis, lesser and  greater curve were normal on  retroflexed and straight visualization.  The scope was slowly withdrawn back  to 20 cm.  No additional findings were seen.  We went ahead and took  biopsies just above the GE junction to rule out any microscopic changes  compatible with Barrett's.  Air was suctioned, scope removed.  Again, a good  look at the proximal and mid esophagus were normal.  The scope was  withdrawn.  The patient tolerated the procedure well.  There was no obvious  immediate complication.   ENDOSCOPIC DIAGNOSES:  1. Status post hiatal hernia repair with some loosening of the wrap.  2. Distal esophageal biopsies without obvious signs of Barrett's.  3. Otherwise within normal limits to the third, fourth part of the duodenum.   PLAN:  1. Follow-up p.r.n. or in six months.  2. Continue current management.  3. Await pathology to see if further Barrett's screening is needed.                                               Petra Kuba, M.D.    MEM/MEDQ  D:  06/26/2002  T:  06/26/2002  Job:  [id number]   cc:  Corwin Levins, M.D. [facility]

## 2010-05-20 NOTE — Op Note (Signed)
NAMEALLYSON, TINEO                   ACCOUNT NO.:  [id number]   MEDICAL RECORD NO.:  [id number]          PATIENT TYPE:  INP   LOCATION:  2399                         FACILITY:  [facility]   PHYSICIAN:  Bedelia Person, M.D.        DATE OF BIRTH:  30-Jul-1930   DATE OF PROCEDURE:  11/30/2003  DATE OF DISCHARGE:                                 OPERATIVE REPORT   Ms. Toomey is a patient with acute myocardial infarction scheduled for  emergency coronary artery bypass grafting.  She has been n.p.o.  Has no  history of esophageal or gastric complications.  The TEE will be used to  evaluate left ventricular function intraoperatively as well as mitral and  aortic valvular function.  The patient was induced with general anesthesia.  The airway was secured using an oroendotracheal tube.  Gastric contents were  suctioned with an orogastric tube which was then removed.  The  transesophageal probe was placed in its sleeve, heavily lubricated, and then  the sleeve was lubricated and passed down to the oropharynx to the 40 cm  mark.  It remained throughout the case, obtaining various omniplane views.  The pre-bypass examination revealed the left ventricle to be slightly  thickened.  There was a markedly dyskinetic mid-septal wall, with  hypokinesis at the apical septum.  The posterior wall was also very  hypokinetic, as was the anterior wall.  Anterolateral, lateral, and  posterolateral wall revealed normal contractility.  Left atrium was normal  in appearance, slightly enlarged.  The mitral valve was slightly thickened.  Valve leaflets were coapting well.  No calcification could be detected.  Color Doppler revealed 2+ central mitral regurge.  The aortic valve had  three leaflets.  No calcification was noted.  They all opened and closed  appropriately.  There was mild aortic insufficiency.  The patient was placed  on cardiopulmonary bypass and underwent coronary artery bypass grafting.  At  the completion of the  bypass period, the patient was on inotropic support  with dopamine, and milrinone was later added.  The left ventricle now  appeared more dynamic in contractility.  The anterior wall was severely  hypokinetic.  The posterior wall had improved in contractility, being mild  to moderate hypokinesis which improved over time after the bypass period was  completed.  The septal wall also improved.  It showed hypokinesis with  possibly little dyskinesis in the mid-septal wall region.  Again, the  lateral wall showed good contractility.  The mitral valve was unchanged in  function, 2+ central regurgitant mitral regurge was present, and again, the  aortic valve was also unchanged.  Right ventricular function was normal in  both the pre- and post-bypass examination.       LK/MEDQ  D:  11/30/2003  T:  12/01/2003  Job:  [id number]

## 2010-05-20 NOTE — H&P (Signed)
[name], [name]                   ACCOUNT NO.:  [id number]   MEDICAL RECORD NO.:  [id number]          PATIENT TYPE:  EMS   LOCATION:  MAJO                         FACILITY:  [facility]   PHYSICIAN:  Thereasa Solo. Little, M.D. DATE OF BIRTH:  [birth year]   DATE OF ADMISSION:  11/28/2003  DATE OF DISCHARGE:                                HISTORY & PHYSICAL   ADMISSION DIAGNOSIS:  Non-Q-wave myocardial infarction.   [name] is a 77 year old female who came to the emergency room via EMS. She  has a three-day history of pressure-type sensation that radiates across her  entire upper chest from left to right, extends around her back, and then  radiates down her left arm. It has been associated with mild diaphoresis,  but not shortness of breath or nausea. She has had some mild jaw pain  associated with this. These episodes occur about three or four times a day,  occur at rest, and last up to 15 minutes in duration.   She has had no palpitations, dizziness, or syncope.   She has no prior history of heart disease. Her only cardiovascular risk  factor is that her mother had heart disease, but at a later age in her 44s.   ALLERGIES:  None.   MEDICATIONS:  Nexium.   PAST SURGICAL HISTORY:  None.   PAST MEDICAL HISTORY:  1.  Reflux and hiatal hernia followed by Dr. Vida Rigger.  2.  Questionable asthma/bronchitis, use of a p.r.n. codeine-type cough      syrup.   FAMILY HISTORY:  Father had a MI at 1. Mother had heart disease in her 12s.   SOCIAL HISTORY:  She is married. She has three children. Two died in an  automobile accident. One is alive and well.  No cigarettes or alcohol use.  She is retired from [employer] and [employer].   REVIEW OF SYSTEMS:  She has had no edema, no claudication, no syncope. She  has seen no blood in her bowel movements. Occasionally  has expiratory  wheezes. Denies PND or orthopnea. Weight is stable. Bowel habits normal.   PHYSICAL EXAMINATION:  VITAL  SIGNS: Blood pressure 131/76, heart rate 92,  respiratory rate 20, temperature 97.7, 100% saturation on two liters.  SKIN: Warm and dry. The patient is pain free.  LUNGS: Faint expiratory wheezes. No rales or rhonchi.  CARDIAC: Distant heart sounds, regular rhythm. No murmur.  LYMPH NODES: There is no supraclavicular adenopathy. No carotid, abdominal,  or femoral bruits heard.  ABDOMEN: The liver edge is not palpable. Abdomen is slightly obese.  EXTREMITIES: Good pulses in the upper and lower extremities. There is no  edema.   Resting EKG shows Qs in V1 and V2 with T-wave inversion consistent with a  septal infarct. CK-MB 21 and troponin is 0.58. Potassium slightly decreased  at 3.3.   ASSESSMENT:  Non-Q-wave myocardial infarction with low positive cardiac  enzymes, now pain free. She is on intravenous nitroglycerin, was started on  IV heparin, aspirin, and beta blockers. She will be scheduled for cardiac  catheterization in the morning.  Lipid profile has been ordered. I plan to  start her on statin therapy also.       ABL/MEDQ  D:  11/28/2003  T:  11/28/2003  Job:  [id number]   cc:   Penni Bombard, MD  Fax: [phone]

## 2010-06-18 ENCOUNTER — Encounter: Payer: Self-pay | Admitting: Internal Medicine

## 2010-07-04 ENCOUNTER — Other Ambulatory Visit: Payer: Self-pay | Admitting: Internal Medicine

## 2010-07-04 DIAGNOSIS — Z1231 Encounter for screening mammogram for malignant neoplasm of breast: Secondary | ICD-10-CM

## 2010-07-29 ENCOUNTER — Ambulatory Visit: Payer: Self-pay | Admitting: Internal Medicine

## 2010-08-08 ENCOUNTER — Ambulatory Visit
Admission: RE | Admit: 2010-08-08 | Discharge: 2010-08-08 | Disposition: A | Discharge status: Home / Self Care | Payer: Medicare Other | Source: Ambulatory Visit | Attending: Internal Medicine | Admitting: Internal Medicine

## 2010-08-08 DIAGNOSIS — Z1231 Encounter for screening mammogram for malignant neoplasm of breast: Secondary | ICD-10-CM

## 2010-08-19 ENCOUNTER — Ambulatory Visit (INDEPENDENT_AMBULATORY_CARE_PROVIDER_SITE_OTHER): Payer: Medicare Other | Admitting: Internal Medicine

## 2010-08-19 ENCOUNTER — Encounter: Payer: Self-pay | Admitting: Internal Medicine

## 2010-08-19 VITALS — BP 124/72 | HR 85 | Temp 98.0°F | Ht 62.0 in | Wt 148.0 lb

## 2010-08-19 DIAGNOSIS — E785 Hyperlipidemia, unspecified: Secondary | ICD-10-CM

## 2010-08-19 DIAGNOSIS — I2510 Atherosclerotic heart disease of native coronary artery without angina pectoris: Secondary | ICD-10-CM

## 2010-08-19 DIAGNOSIS — I10 Essential (primary) hypertension: Secondary | ICD-10-CM

## 2010-08-19 DIAGNOSIS — F0390 Unspecified dementia without behavioral disturbance: Secondary | ICD-10-CM

## 2010-08-19 NOTE — Assessment & Plan Note (Addendum)
Son is POA, pt stable overall by hx and exam, most recent data reviewed with pt and son, and pt to continue medical treatment as before, but husband no longer able to help "watch" her as she tends to wander out of the home, for now will fill out FL2 form to apply for medicaid (to help with long term placement, which will take minimum 45 days); but as she "wanders" now, son is asking for help with arranging for private sitter until the time she can be placed long term;  If unable to get suitable care privately, he may be forced to take her to hospital for inpt care as she could be a danger to herself

## 2010-08-19 NOTE — Patient Instructions (Addendum)
You will be contacted regarding the referral for: Home health for needs, to include the possibility of private sitter Continue all other medications as before

## 2010-08-21 ENCOUNTER — Encounter: Payer: Self-pay | Admitting: Internal Medicine

## 2010-08-21 NOTE — Assessment & Plan Note (Signed)
stable overall by hx and exam, most recent data reviewed with pt, and pt to continue medical treatment as before  Lab Results  Component Value Date   WBC 6.6 07/26/2009   HGB 14.2 07/26/2009   HCT 41.4 07/26/2009   PLT 207.0 07/26/2009   CHOL 238* 07/26/2009   TRIG 257.0* 07/26/2009   HDL 38.30* 07/26/2009   LDLDIRECT 168.6 07/26/2009   ALT 18 07/26/2009   AST 24 07/26/2009   NA 138 07/26/2009   K 4.7 07/26/2009   CL 104 07/26/2009   CREATININE 0.7 07/26/2009   BUN 16 07/26/2009   CO2 26 07/26/2009   TSH 1.73 07/26/2009   BP Readings from Last 3 Encounters:  08/19/10 124/72  04/19/10 92/54  02/01/10 112/70

## 2010-08-21 NOTE — Assessment & Plan Note (Signed)
stable overall by hx and exam, most recent data reviewed with pt though declines ecg today, and pt to continue medical treatment as before

## 2010-08-21 NOTE — Progress Notes (Signed)
Subjective:    Patient ID: [name], female    DOB: 1930/06/05, 77 y.o.   MRN: [id number]  HPI Here with son and her parttime home helper today, in a bit urgent situation as though pt has stable dementia (Dementia overall stable symptomatically with gradual worsening at best, and not assoc with behavioral changes such as hallucinations, paranoia, or agitation) her husband who has normally kept her cared for, now is bedridden with recent stroke.  Her home helper is parttime only, and son is hardpressed to find suitable care, as he is unable and no other family.  Has applied to medicaid for her to then seek long term placement, needs FL2 form to complete the application filled out today, but this takes 45 days minimum application.  Wants to discuss any other options. Pt was found outside the home last night wandering.  Pt with dementia , but Pt denies chest pain, increased sob or doe, wheezing, orthopnea, PND, increased LE swelling, palpitations, dizziness or syncope.  Pt denies new neurological symptoms such as new headache, or facial or extremity weakness or numbness   Pt denies polydipsia, polyuria  Past Medical History  Diagnosis Date  . HYPERLIPIDEMIA 12/20/2006  . Senile dementia, uncomplicated 01/06/2009  . INSOMNIA-SLEEP DISORDER-UNSPEC 11/09/2006  . BLURRED VISION 06/29/2008  . HYPERTENSION 11/09/2006  . CORONARY ARTERY DISEASE 07/24/2006  . ASTHMATIC BRONCHITIS, ACUTE 01/14/2007  . ALLERGIC RHINITIS 11/09/2006  . GERD 06/04/2006  . CONSTIPATION 03/01/2009  . BACK PAIN 03/01/2009  . OSTEOPOROSIS 10/13/2009  . FATIGUE 01/21/2008  . CHEST PAIN 09/12/2007  . CEREBROVASCULAR ACCIDENT, HX OF 07/24/2006  . Goiter, unspecified 02/01/2010  . Cough 02/01/2010  . Asthma   . Chronic cough    Past Surgical History  Procedure Date  . Dilation and curettage of uterus     reports that she has never smoked. She does not have any smokeless tobacco history on file. She reports that she does not drink alcohol  or use illicit drugs. family history includes Coronary artery disease in her other. Allergies  Allergen Reactions  . Metoclopramide Hcl   . Simvastatin    Current Outpatient Prescriptions on File Prior to Visit  Medication Sig Dispense Refill  . albuterol (PROAIR HFA) 108 (90 BASE) MCG/ACT inhaler Inhale 2 puffs into the lungs 4 (four) times daily.        Marland Kitchen aspirin 81 MG tablet Take 81 mg by mouth daily.        . Cholecalciferol (VITAMIN D) 2000 UNITS CAPS Take by mouth daily.        Marland Kitchen donepezil (ARICEPT) 10 MG tablet Take 10 mg by mouth at bedtime as needed.        Marland Kitchen esomeprazole (NEXIUM) 40 MG capsule Take 40 mg by mouth daily before breakfast.        . fexofenadine (ALLEGRA) 180 MG tablet Take 180 mg by mouth daily as needed.        . fluticasone (FLONASE) 50 MCG/ACT nasal spray 2 sprays by Nasal route daily.        . memantine (NAMENDA) 5 MG tablet Take 5 mg by mouth daily.        . pravastatin (PRAVACHOL) 40 MG tablet Take 40 mg by mouth daily.        . traMADol-acetaminophen (ULTRACET) 37.5-325 MG per tablet Take 1 tablet by mouth every 6 (six) hours as needed.        . triamcinolone (KENALOG) 0.1 % cream Apply topically 2 (two) times  daily.         Review of Systems Review of Systems  Constitutional: Negative for diaphoresis and unexpected weight change.  HENT: Negative for drooling and tinnitus.   Eyes: Negative for photophobia and visual disturbance.  Respiratory: Negative for choking and stridor.   Gastrointestinal: Negative for vomiting and blood in stool.  Genitourinary: Negative for hematuria and decreased urine volume.  Musculoskeletal: Negative for gait problem.  Skin: Negative for color change and wound.  Neurological: Negative for tremors and numbness.  Psychiatric/Behavioral: Negative for decreased concentration. The patient is not hyperactive.       Objective:   Physical Exam BP 124/72  Pulse 85  Temp(Src) 98 F (36.7 C) (Oral)  Ht 5\' 2"  (1.575 m)  Wt 148  lb (67.132 kg)  BMI 27.07 kg/m2  SpO2 93% Physical Exam  VS noted Constitutional: Pt appears well-developed and well-nourished.  HENT: Head: Normocephalic.  Right Ear: External ear normal.  Left Ear: External ear normal.  Eyes: Conjunctivae and EOM are normal. Pupils are equal, round, and reactive to light.  Neck: Normal range of motion. Neck supple.  Cardiovascular: Normal rate and regular rhythm.   Pulmonary/Chest: Effort normal and breath sounds normal.  Abd:  Soft, NT, non-distended, + BS Neurological: Pt is alert. No cranial nerve deficit.  Skin: Skin is warm. No erythema.  Psychiatric: Pt behavior is normal. Thought content d/w dementia.         Assessment & Plan:

## 2010-08-21 NOTE — Assessment & Plan Note (Signed)
stable overall by hx and exam, most recent data reviewed with pt, and pt to continue medical treatment as before  Lab Results  Component Value Date   LDLCALC 96 03/05/2008   Declines lipids today, though has been some time since last done

## 2010-10-07 ENCOUNTER — Telehealth: Payer: Self-pay

## 2010-10-07 NOTE — Telephone Encounter (Signed)
I do not admit to the hospital  If unable to care for her, please simply take to the ER , where she should be admitted for safety , and to work on long term  placement at that time

## 2010-10-07 NOTE — Telephone Encounter (Signed)
Son informed, pt is safe thru the weekend, they have OV scheduled for Monday w/Dr Jonny Ruiz

## 2010-10-07 NOTE — Telephone Encounter (Signed)
The patients son called Abbigael Detlefsen) and needs the patient admitted to the hospital. The patients husband is now unable to care for her  and they need to place her asap. The son stated the [facility] form was completed at the last OV, please advise. Call back number is [phone] or [phone].

## 2010-10-10 ENCOUNTER — Encounter: Payer: Self-pay | Admitting: Internal Medicine

## 2010-10-10 ENCOUNTER — Ambulatory Visit (INDEPENDENT_AMBULATORY_CARE_PROVIDER_SITE_OTHER): Payer: Medicare Other | Admitting: Internal Medicine

## 2010-10-10 ENCOUNTER — Other Ambulatory Visit (INDEPENDENT_AMBULATORY_CARE_PROVIDER_SITE_OTHER): Payer: Medicare Other

## 2010-10-10 VITALS — BP 120/70 | HR 118 | Temp 97.9°F | Ht 62.0 in | Wt 148.4 lb

## 2010-10-10 DIAGNOSIS — R5383 Other fatigue: Secondary | ICD-10-CM

## 2010-10-10 DIAGNOSIS — F0390 Unspecified dementia without behavioral disturbance: Secondary | ICD-10-CM

## 2010-10-10 DIAGNOSIS — E785 Hyperlipidemia, unspecified: Secondary | ICD-10-CM

## 2010-10-10 DIAGNOSIS — Z111 Encounter for screening for respiratory tuberculosis: Secondary | ICD-10-CM

## 2010-10-10 DIAGNOSIS — I10 Essential (primary) hypertension: Secondary | ICD-10-CM

## 2010-10-10 DIAGNOSIS — J45909 Unspecified asthma, uncomplicated: Secondary | ICD-10-CM

## 2010-10-10 DIAGNOSIS — R5381 Other malaise: Secondary | ICD-10-CM

## 2010-10-10 LAB — LIPID PANEL
HDL: 44.6 mg/dL (ref >39.00)
LDL Cholesterol: 76 mg/dL (ref 0–99)
Total CHOL/HDL Ratio: 4
Triglycerides: 188 mg/dL — ABNORMAL HIGH (ref 0.0–149.0)

## 2010-10-10 LAB — BASIC METABOLIC PANEL
CO2: 31 mEq/L (ref 19–32)
Calcium: 9.2 mg/dL (ref 8.4–10.5)
Creatinine, Ser: 0.8 mg/dL (ref 0.4–1.2)
GFR: 72.62 mL/min (ref >60.00)
Sodium: 141 mEq/L (ref 135–145)

## 2010-10-10 LAB — CBC WITH DIFFERENTIAL/PLATELET
Basophils Relative: 0.4 % (ref 0.0–3.0)
HCT: 42.3 % (ref 36.0–46.0)
Hemoglobin: 14.3 g/dL (ref 12.0–15.0)
Lymphocytes Relative: 30.9 % (ref 12.0–46.0)
Lymphs Abs: 2.3 10*3/uL (ref 0.7–4.0)
MCHC: 33.8 g/dL (ref 30.0–36.0)
Monocytes Relative: 6 % (ref 3.0–12.0)
Neutro Abs: 4.6 10*3/uL (ref 1.4–7.7)
RBC: 4.51 Mil/uL (ref 3.87–5.11)

## 2010-10-10 LAB — HEPATIC FUNCTION PANEL
Albumin: 4 g/dL (ref 3.5–5.2)
Alkaline Phosphatase: 75 U/L (ref 39–117)
Bilirubin, Direct: 0.1 mg/dL (ref 0.0–0.3)
Total Protein: 7 g/dL (ref 6.0–8.3)

## 2010-10-10 MED ORDER — FLUTICASONE-SALMETEROL 250-50 MCG/DOSE IN AEPB: 1.0000 | INHALATION_SPRAY | Freq: Two times a day (BID) | RESPIRATORY_TRACT | Status: DC

## 2010-10-10 NOTE — Progress Notes (Signed)
Subjective:    Patient ID: [name], female    DOB: 1930/06/10, 77 y.o.   MRN: [id number]  HPI  Here to f/u;  Due to enter NH tomorrow for long term, needs FL 2 amended on date and current meds and faxed;  Pt denies chest pain, increased sob or doe, wheezing, orthopnea, PND, increased LE swelling, palpitations, dizziness or syncope, except for intermittnet cough and mild wheeze in the past month.  Pt denies fever, wt loss, night sweats, loss of appetite, or other constitutional symptoms  Pt denies new neurological symptoms such as new headache, or facial or extremity weakness or numbness   Pt denies polydipsia, polyuria.  Dementia overall stable symptomatically with gradual worsening at best, and not assoc with behavioral changes such as hallucinations, paranoia, or agitation.  Denies worsening depressive symptoms, suicidal ideation, or panic, though has ongoing anxiety.  Skin lesions to both shoulder she picks at them are improved but not yet healed.  Pt simply cannot use the proair - cannot coordinate;  And declines the flonase and allegra as well.  Does have sense of ongoing fatigue, but denies signficant hypersomnolence. Past Medical History  Diagnosis Date  . HYPERLIPIDEMIA 12/20/2006  . Senile dementia, uncomplicated 01/06/2009  . INSOMNIA-SLEEP DISORDER-UNSPEC 11/09/2006  . BLURRED VISION 06/29/2008  . HYPERTENSION 11/09/2006  . CORONARY ARTERY DISEASE 07/24/2006  . ASTHMATIC BRONCHITIS, ACUTE 01/14/2007  . ALLERGIC RHINITIS 11/09/2006  . GERD 06/04/2006  . CONSTIPATION 03/01/2009  . BACK PAIN 03/01/2009  . OSTEOPOROSIS 10/13/2009  . FATIGUE 01/21/2008  . CHEST PAIN 09/12/2007  . CEREBROVASCULAR ACCIDENT, HX OF 07/24/2006  . Goiter, unspecified 02/01/2010  . Cough 02/01/2010  . Asthma   . Chronic cough    Past Surgical History  Procedure Date  . Dilation and curettage of uterus     reports that she has never smoked. She does not have any smokeless tobacco history on file. She reports that she  does not drink alcohol or use illicit drugs. family history includes Coronary artery disease in her other. Allergies  Allergen Reactions  . Metoclopramide Hcl   . Simvastatin    Current Outpatient Prescriptions on File Prior to Visit  Medication Sig Dispense Refill  . aspirin 81 MG tablet Take 81 mg by mouth daily.        . Cholecalciferol (VITAMIN D) 2000 UNITS CAPS Take by mouth daily.        Marland Kitchen donepezil (ARICEPT) 10 MG tablet Take 10 mg by mouth at bedtime as needed.        Marland Kitchen esomeprazole (NEXIUM) 40 MG capsule Take 40 mg by mouth daily before breakfast.        . memantine (NAMENDA) 5 MG tablet Take 5 mg by mouth daily.        . pravastatin (PRAVACHOL) 40 MG tablet Take 40 mg by mouth daily.        Marland Kitchen triamcinolone (KENALOG) 0.1 % cream Apply topically 2 (two) times daily.        Marland Kitchen albuterol (PROAIR HFA) 108 (90 BASE) MCG/ACT inhaler Inhale 2 puffs into the lungs 4 (four) times daily.        . fexofenadine (ALLEGRA) 180 MG tablet Take 180 mg by mouth daily as needed.        . fluticasone (FLONASE) 50 MCG/ACT nasal spray 2 sprays by Nasal route daily.        . traMADol-acetaminophen (ULTRACET) 37.5-325 MG per tablet Take 1 tablet by mouth every 6 (six) hours as  needed.         Review of Systems Review of Systems  Constitutional: Negative for diaphoresis and unexpected weight change.  HENT: Negative for drooling and tinnitus.   Eyes: Negative for photophobia and visual disturbance.  Respiratory: Negative for choking and stridor.   Gastrointestinal: Negative for vomiting and blood in stool.  Genitourinary: Negative for hematuria and decreased urine volume.     Objective:   Physical Exam BP 120/70  Pulse 118  Temp(Src) 97.9 F (36.6 C) (Oral)  Ht 5\' 2"  (1.575 m)  Wt 148 lb 6 oz (67.302 kg)  BMI 27.14 kg/m2  SpO2 91% Physical Exam  VS noted Constitutional: Pt appears well-developed and well-nourished.  HENT: Head: Normocephalic.  Right Ear: External ear normal.  Left Ear:  External ear normal.  Eyes: Conjunctivae and EOM are normal. Pupils are equal, round, and reactive to light.  Neck: Normal range of motion. Neck supple.  Cardiovascular: Normal rate and regular rhythm.   Pulmonary/Chest: Effort normal and breath sounds normal.  Abd:  Soft, NT, non-distended, + BS Neurological: Pt is alert. No cranial nerve deficit.  Skin: Skin is warm. No erythema.  Psychiatric: Pt behavior is normal. Thought content c/w dementia, in NAD, not depressed or overly nervous today     Assessment & Plan:

## 2010-10-10 NOTE — Assessment & Plan Note (Signed)
stable overall by hx and exam, most recent data reviewed with pt, and pt to continue medical treatment as before l  Lab Results  Component Value Date   WBC 6.6 07/26/2009   HGB 14.2 07/26/2009   HCT 41.4 07/26/2009   PLT 207.0 07/26/2009   GLUCOSE 103* 07/26/2009   CHOL 238* 07/26/2009   TRIG 257.0* 07/26/2009   HDL 38.30* 07/26/2009   LDLDIRECT 168.6 07/26/2009   LDLCALC 96 03/05/2008   ALT 18 07/26/2009   AST 24 07/26/2009   NA 138 07/26/2009   K 4.7 07/26/2009   CL 104 07/26/2009   CREATININE 0.7 07/26/2009   BUN 16 07/26/2009   CO2 26 07/26/2009   TSH 1.73 07/26/2009

## 2010-10-10 NOTE — Assessment & Plan Note (Signed)
Mild intermittent, likely seasonal flare, mild at this time- to start the adviar which is added to the fl2 as well,  to f/u any worsening symptoms or concerns  SpO2 Readings from Last 3 Encounters:  10/10/10 91%  08/19/10 93%  04/19/10 96%

## 2010-10-10 NOTE — Assessment & Plan Note (Signed)
Etiology unclear, Exam otherwise benign, to check labs as documented, follow with expectant management  

## 2010-10-10 NOTE — Progress Notes (Signed)
Addended by: Scharlene Gloss B on: 10/10/2010 03:45 PM   Modules accepted: Orders

## 2010-10-10 NOTE — Patient Instructions (Signed)
Take all new medications as prescribed - the advair Your FL2 form was adjusted today Continue all other medications as before Please go to LAB in the Basement for the blood and/or urine tests to be done today Please call the phone number [phone] (the PhoneTree System) for results of testing in 2-3 days;  When calling, simply dial the number, and when prompted enter the MRN number above (the Medical Record Number) and the # key, then the message should start.

## 2010-10-10 NOTE — Assessment & Plan Note (Signed)
stable overall by hx and exam, most recent data reviewed with pt, and pt to continue medical treatment as before  Lab Results  Component Value Date   LDLCALC 96 03/05/2008

## 2010-10-10 NOTE — Assessment & Plan Note (Addendum)
stable overall by hx and exam, most recent data reviewed with pt, and pt to continue medical treatment as before  Lab Results  Component Value Date   WBC 6.6 07/26/2009   HGB 14.2 07/26/2009   HCT 41.4 07/26/2009   PLT 207.0 07/26/2009   GLUCOSE 103* 07/26/2009   CHOL 238* 07/26/2009   TRIG 257.0* 07/26/2009   HDL 38.30* 07/26/2009   LDLDIRECT 168.6 07/26/2009   LDLCALC 96 03/05/2008   ALT 18 07/26/2009   AST 24 07/26/2009   NA 138 07/26/2009   K 4.7 07/26/2009   CL 104 07/26/2009   CREATININE 0.7 07/26/2009   BUN 16 07/26/2009   CO2 26 07/26/2009   TSH 1.73 07/26/2009   BP Readings from Last 3 Encounters:  10/10/10 120/70  08/19/10 124/72  04/19/10 92/54

## 2010-10-10 NOTE — Assessment & Plan Note (Signed)
stable overall by hx and exam, most recent data reviewed with pt, and pt to continue medical treatment as before  Pt declines to take the allegra/flonase

## 2010-10-19 ENCOUNTER — Ambulatory Visit: Payer: Medicare Other | Admitting: Internal Medicine

## 2010-10-19 DIAGNOSIS — Z0289 Encounter for other administrative examinations: Secondary | ICD-10-CM

## 2010-10-20 ENCOUNTER — Telehealth: Payer: Self-pay | Admitting: *Deleted

## 2010-10-20 NOTE — Telephone Encounter (Signed)
Son return call back mom is in nursing home now [facility]) but he still want her to have prolia injection. Can calll him back with appt once insurance & med has been verified...10/20/10@1 :56pm/LMB

## 2010-10-20 NOTE — Telephone Encounter (Signed)
Called son no answer LMOM RTC concerning prolia injection...10/20/10@1 :38pm/LMB

## 2010-10-20 NOTE — Telephone Encounter (Signed)
Message copied by Deatra James on Thu Oct 20, 2010  1:33 PM ------      Message from: Clover Mealy      Created: Wed Oct 19, 2010 12:11 PM      Regarding: PROLIA INJECTION       Hi Makaia Rappa,            Mrs. Bennie is Dr. Raphael Gibney pt who is in need of a Prolia injection.  Her home # h/b disconnected, but I read in the notes that her son called wanting to have her admitted to the hospital on 10/07/10.  Could you please contact him regarding her status & let me know if she will be able to come in for an injection?   He can be reached @ [phone] or [phone].    Thanks [employer]

## 2010-10-20 NOTE — Telephone Encounter (Signed)
Message copied by Deatra James on Thu Oct 20, 2010  1:36 PM ------      Message from: Clover Mealy      Created: Wed Oct 19, 2010 12:11 PM      Regarding: PROLIA INJECTION       Hi Icelynn Onken,            Mrs. Swanton is Dr. Raphael Gibney pt who is in need of a Prolia injection.  Her home # h/b disconnected, but I read in the notes that her son called wanting to have her admitted to the hospital on 10/07/10.  Could you please contact him regarding her status & let me know if she will be able to come in for an injection?   He can be reached @ [phone] or [phone].    Thanks [employer]

## 2011-01-30 ENCOUNTER — Other Ambulatory Visit: Payer: Self-pay

## 2011-01-30 MED ORDER — DONEPEZIL HCL 10 MG PO TABS: 10.0000 mg | ORAL_TABLET | Freq: Every evening | ORAL | Status: DC | PRN

## 2011-06-19 ENCOUNTER — Encounter (HOSPITAL_COMMUNITY): Payer: Self-pay | Admitting: *Deleted

## 2011-06-19 ENCOUNTER — Emergency Department (HOSPITAL_COMMUNITY)
Admission: EM | Admit: 2011-06-19 | Discharge: 2011-06-19 | Disposition: A | Discharge status: Skilled Nursing Facility | Payer: Medicare Other | Attending: Emergency Medicine | Admitting: Emergency Medicine

## 2011-06-19 DIAGNOSIS — F0390 Unspecified dementia without behavioral disturbance: Secondary | ICD-10-CM | POA: Insufficient documentation

## 2011-06-19 DIAGNOSIS — Z8673 Personal history of transient ischemic attack (TIA), and cerebral infarction without residual deficits: Secondary | ICD-10-CM | POA: Insufficient documentation

## 2011-06-19 DIAGNOSIS — S0990XA Unspecified injury of head, initial encounter: Secondary | ICD-10-CM | POA: Insufficient documentation

## 2011-06-19 DIAGNOSIS — M810 Age-related osteoporosis without current pathological fracture: Secondary | ICD-10-CM | POA: Insufficient documentation

## 2011-06-19 DIAGNOSIS — Y9210 Unspecified residential institution as the place of occurrence of the external cause: Secondary | ICD-10-CM | POA: Insufficient documentation

## 2011-06-19 DIAGNOSIS — J45909 Unspecified asthma, uncomplicated: Secondary | ICD-10-CM | POA: Insufficient documentation

## 2011-06-19 DIAGNOSIS — E785 Hyperlipidemia, unspecified: Secondary | ICD-10-CM | POA: Insufficient documentation

## 2011-06-19 DIAGNOSIS — I2510 Atherosclerotic heart disease of native coronary artery without angina pectoris: Secondary | ICD-10-CM | POA: Insufficient documentation

## 2011-06-19 DIAGNOSIS — W06XXXA Fall from bed, initial encounter: Secondary | ICD-10-CM | POA: Insufficient documentation

## 2011-06-19 NOTE — ED Notes (Signed)
Spoke to [facility] told that pt will be returning and given report.

## 2011-06-19 NOTE — Discharge Instructions (Signed)
Head injury ° °You have had a head injury which does not appear to require admission at this time. A concussion is a status changed mental ability because of trauma. ° °Seek immediate medical attention if: ° °· There is confusion or drowsiness °· You cannot awaken the injured portion °· (Although children frequently become drowsy after injury) °· There is nausea or continued, forceful vomiting °· You notice dizziness or unsteadiness which is getting worse, or inability to walk °· You have convulsions or unconsciousness °· You experience a severe, persistent headaches not relieved by Tylenol. (Do not take aspirin as this in pairs clotting abilities). Take other pain medications only as directed °· You cannot use arms or legs normally °· There are changes in pupil size of the eye °· There is clear or bloody discharge from the nose or ears °· Change in speech, vision, swallowing or understanding. °· Localized weakness, numbness, tingling or change in bowel or bladder control ° ° °Please followup with your doctor in the next 2 days if still having symptoms. If you do not have a family doctor, see the list of followup contact information below. ° °RESOURCE GUIDE ° °Dental Problems ° °Patients with Medicaid: °[facility]                     [facility] °[address]                                           [address] °Phone:  [phone]                                                  Phone:  [phone] ° °If unable to pay or uninsured, contact:  [facility] or [facility] to become qualified for the adult dental clinic. ° °Chronic Pain Problems °Contact [facility]  [phone] °Patients need to be referred by their primary care doctor. ° °Insufficient Money for Medicine °Contact [employer]:  call "211" or [facility] 271-5999. ° °No Primary Care Doctor °Call [facility]  832-8000 °Other agencies that provide inexpensive medical care ° [facility]  832-8035 °   [facility]  832-7272 °   [facility]  [phone] °   [facility]  832-4777 °   [facility]  [phone] °   [facility]  [phone] ° °Psychological Services °[facility]  [phone] °[facility]  [phone] °[facility]   [phone] (emergency services 641-4993) ° °Substance Abuse Resources °[facility]  336-882-2125 °[facility] 336-784-9470 °[facility] 336-285-9073 °[facility] 336-845-3988 °Residential & Outpatient Substance Abuse Program  800-659-3381 ° °Abuse/Neglect °[place] [facility] (336) 641-3795 °[place] [facility] 800-378-5315 (After Hours) ° °Emergency Shelter °[facility] (336) 271-5985 ° °Maternity Homes °[facility] (336) 275-9566 °[facility] (704) 372-4663 ° °MRSA Hotline #:   832-7006 ° ° ° °[place] [facility] ° °[facility] [facility]     [employer]                            [facility] °[address] [place]                       [address]      [address]  °[place]                                                [place]                            [place] °Phone:  349-3220                                   Phone:  [phone]                 Phone:  342-8140 ° °[facility] °Phone:  342-8316 ° °[place] [facility] °(336) 342-1394 °(336) 342-3537 (After Hours) ° ° ° ° °

## 2011-06-19 NOTE — ED Notes (Signed)
[facility] called to transport pt back to [facility]. Pt aware and Teola Bradley aware. Pt ambulatory to bathroom and then placed on [facility] stretcher.

## 2011-06-19 NOTE — ED Provider Notes (Signed)
History     CSN: [id number]  Arrival date & time 06/19/11  0602   First MD Initiated Contact with Patient 06/19/11 [phone]      Chief Complaint  Patient presents with  . Fall    (Consider location/radiation/quality/duration/timing/severity/associated sxs/prior treatment) HPI Comments: 78 year old female who presents after rolling out of bed and bumping her head. She was trying to get back into her bed without any difficulty when her roommate called the nurse to let them know that she had fallen. The patient did not want to come to the hospital and states that she has no complaints at this time. She denies a headache though she did state that she had a mild headache when it initially happened. There is no associated bruising, no blurred vision, no numbness weakness dizziness nausea vomiting or loss of consciousness. This occurred just prior to arrival and was acute in onset.  Patient is a 78 y.o. female presenting with fall. The history is provided by the patient and the EMS personnel.  Fall    Past Medical History  Diagnosis Date  . HYPERLIPIDEMIA 12/20/2006  . Senile dementia, uncomplicated 01/06/2009  . INSOMNIA-SLEEP DISORDER-UNSPEC 11/09/2006  . BLURRED VISION 06/29/2008  . HYPERTENSION 11/09/2006  . CORONARY ARTERY DISEASE 07/24/2006  . ASTHMATIC BRONCHITIS, ACUTE 01/14/2007  . ALLERGIC RHINITIS 11/09/2006  . GERD 06/04/2006  . CONSTIPATION 03/01/2009  . BACK PAIN 03/01/2009  . OSTEOPOROSIS 10/13/2009  . FATIGUE 01/21/2008  . CHEST PAIN 09/12/2007  . CEREBROVASCULAR ACCIDENT, HX OF 07/24/2006  . Goiter, unspecified 02/01/2010  . Cough 02/01/2010  . Asthma   . Chronic cough     Past Surgical History  Procedure Date  . Dilation and curettage of uterus     Family History  Problem Relation Age of Onset  . Coronary artery disease Other     History  Substance Use Topics  . Smoking status: Never Smoker   . Smokeless tobacco: Not on file  . Alcohol Use: No    OB History    Grav Para Term Preterm Abortions TAB SAB Ect Mult Living                  Review of Systems  All other systems reviewed and are negative.    Allergies  Metoclopramide hcl and Simvastatin  Home Medications   Current Outpatient Rx  Name Route Sig Dispense Refill  . ALBUTEROL SULFATE HFA 108 (90 BASE) MCG/ACT IN AERS Inhalation Inhale 2 puffs into the lungs 4 (four) times daily.      . ASPIRIN 81 MG PO TABS Oral Take 81 mg by mouth daily.      Marland Kitchen VITAMIN D 2000 UNITS PO CAPS Oral Take by mouth daily.      . DONEPEZIL HCL 10 MG PO TABS Oral Take 1 tablet (10 mg total) by mouth at bedtime as needed. 30 tablet 11  . ESOMEPRAZOLE MAGNESIUM 40 MG PO CPDR Oral Take 40 mg by mouth daily before breakfast.      . FEXOFENADINE HCL 180 MG PO TABS Oral Take 180 mg by mouth daily as needed.      Marland Kitchen FLUTICASONE PROPIONATE 50 MCG/ACT NA SUSP Nasal 2 sprays by Nasal route daily.      Marland Kitchen FLUTICASONE-SALMETEROL 250-50 MCG/DOSE IN AEPB Inhalation Inhale 1 puff into the lungs 2 (two) times daily. 1 each 11  . MEMANTINE HCL 5 MG PO TABS Oral Take 5 mg by mouth daily.      Marland Kitchen PRAVASTATIN SODIUM 40  MG PO TABS Oral Take 40 mg by mouth daily.      . TRAMADOL-ACETAMINOPHEN 37.5-325 MG PO TABS Oral Take 1 tablet by mouth every 6 (six) hours as needed.      . TRIAMCINOLONE ACETONIDE 0.1 % EX CREA Topical Apply topically 2 (two) times daily.        BP 135/74  Pulse 97  Temp 98.5 F (36.9 C) (Oral)  Resp 16  SpO2 99%  Physical Exam  Nursing note and vitals reviewed. Constitutional: She appears well-developed and well-nourished. No distress.  HENT:  Head: Normocephalic and atraumatic.  Mouth/Throat: Oropharynx is clear and moist. No oropharyngeal exudate.       There are no signs of trauma to the scalp, no hematoma, no contusion, no laceration.  Eyes: Conjunctivae and EOM are normal. Pupils are equal, round, and reactive to light. Right eye exhibits no discharge. Left eye exhibits no discharge. No scleral  icterus.  Neck: Normal range of motion. Neck supple. No JVD present. No thyromegaly present.  Cardiovascular: Normal rate, regular rhythm, normal heart sounds and intact distal pulses.  Exam reveals no gallop and no friction rub.   No murmur heard. Pulmonary/Chest: Effort normal and breath sounds normal. No respiratory distress. She has no wheezes. She has no rales.  Abdominal: Soft. Bowel sounds are normal. She exhibits no distension and no mass. There is no tenderness.  Musculoskeletal: Normal range of motion. She exhibits no edema and no tenderness.  Lymphadenopathy:    She has no cervical adenopathy.  Neurological: She is alert. Coordination normal.       Normal speech, normal coordination, normal strength in all 4 extremities, normal sensation.  Skin: Skin is warm and dry. No rash noted. No erythema.  Psychiatric: She has a normal mood and affect. Her behavior is normal.    ED Course  Procedures (including critical care time)  Labs Reviewed - No data to display No results found.   1. Minor head injury       MDM  Vital signs are normal, patient is atraumatic and has no complaints, will discharge back to nursing facility, no indication for imaging at this time.        Vida Roller, MD 06/19/11 [phone]

## 2011-06-19 NOTE — ED Notes (Signed)
Pt denies pain except when asked if her head hurts she then says the back part of her head where she fell hurts. Pt is alert and oriented able to move all extremities and follow commands, equal grips and strength bilaterally. No neurological deficits.

## 2011-06-19 NOTE — ED Notes (Addendum)
Per EMS: pt from [facility] pt roommate states that pt fell out of bed, hit head, and got back up and in bed. Pt states slight pain in back of head where she hit head but does not remember falling. Pt alert and oriented x 4 per norm.

## 2012-04-29 ENCOUNTER — Encounter (HOSPITAL_COMMUNITY): Payer: Self-pay | Admitting: Emergency Medicine

## 2012-04-29 ENCOUNTER — Inpatient Hospital Stay (HOSPITAL_COMMUNITY)
Admission: EM | Admit: 2012-04-29 | Discharge: 2012-05-02 | DRG: 203 | Disposition: A | Discharge status: Skilled Nursing Facility | Payer: Medicare Other | Attending: Internal Medicine | Admitting: Internal Medicine

## 2012-04-29 ENCOUNTER — Emergency Department (HOSPITAL_COMMUNITY): Payer: Medicare Other

## 2012-04-29 DIAGNOSIS — I10 Essential (primary) hypertension: Secondary | ICD-10-CM

## 2012-04-29 DIAGNOSIS — Z79899 Other long term (current) drug therapy: Secondary | ICD-10-CM

## 2012-04-29 DIAGNOSIS — F419 Anxiety disorder, unspecified: Secondary | ICD-10-CM

## 2012-04-29 DIAGNOSIS — Z66 Do not resuscitate: Secondary | ICD-10-CM | POA: Diagnosis present

## 2012-04-29 DIAGNOSIS — I2510 Atherosclerotic heart disease of native coronary artery without angina pectoris: Secondary | ICD-10-CM

## 2012-04-29 DIAGNOSIS — R5381 Other malaise: Secondary | ICD-10-CM

## 2012-04-29 DIAGNOSIS — M810 Age-related osteoporosis without current pathological fracture: Secondary | ICD-10-CM

## 2012-04-29 DIAGNOSIS — J309 Allergic rhinitis, unspecified: Secondary | ICD-10-CM

## 2012-04-29 DIAGNOSIS — J45901 Unspecified asthma with (acute) exacerbation: Secondary | ICD-10-CM

## 2012-04-29 DIAGNOSIS — M5137 Other intervertebral disc degeneration, lumbosacral region: Secondary | ICD-10-CM

## 2012-04-29 DIAGNOSIS — F519 Sleep disorder not due to a substance or known physiological condition, unspecified: Secondary | ICD-10-CM

## 2012-04-29 DIAGNOSIS — M51379 Other intervertebral disc degeneration, lumbosacral region without mention of lumbar back pain or lower extremity pain: Secondary | ICD-10-CM

## 2012-04-29 DIAGNOSIS — J45991 Cough variant asthma: Secondary | ICD-10-CM

## 2012-04-29 DIAGNOSIS — E785 Hyperlipidemia, unspecified: Secondary | ICD-10-CM

## 2012-04-29 DIAGNOSIS — E860 Dehydration: Secondary | ICD-10-CM

## 2012-04-29 DIAGNOSIS — K5900 Constipation, unspecified: Secondary | ICD-10-CM

## 2012-04-29 DIAGNOSIS — I509 Heart failure, unspecified: Secondary | ICD-10-CM | POA: Diagnosis present

## 2012-04-29 DIAGNOSIS — G4700 Insomnia, unspecified: Secondary | ICD-10-CM | POA: Diagnosis present

## 2012-04-29 DIAGNOSIS — E876 Hypokalemia: Secondary | ICD-10-CM | POA: Diagnosis present

## 2012-04-29 DIAGNOSIS — I951 Orthostatic hypotension: Secondary | ICD-10-CM

## 2012-04-29 DIAGNOSIS — E049 Nontoxic goiter, unspecified: Secondary | ICD-10-CM

## 2012-04-29 DIAGNOSIS — F0390 Unspecified dementia without behavioral disturbance: Secondary | ICD-10-CM

## 2012-04-29 DIAGNOSIS — Z8673 Personal history of transient ischemic attack (TIA), and cerebral infarction without residual deficits: Secondary | ICD-10-CM

## 2012-04-29 DIAGNOSIS — R5383 Other fatigue: Secondary | ICD-10-CM

## 2012-04-29 DIAGNOSIS — R112 Nausea with vomiting, unspecified: Secondary | ICD-10-CM

## 2012-04-29 DIAGNOSIS — R1311 Dysphagia, oral phase: Secondary | ICD-10-CM | POA: Diagnosis present

## 2012-04-29 DIAGNOSIS — J45909 Unspecified asthma, uncomplicated: Secondary | ICD-10-CM

## 2012-04-29 DIAGNOSIS — K219 Gastro-esophageal reflux disease without esophagitis: Secondary | ICD-10-CM

## 2012-04-29 DIAGNOSIS — Z8679 Personal history of other diseases of the circulatory system: Secondary | ICD-10-CM

## 2012-04-29 DIAGNOSIS — J209 Acute bronchitis, unspecified: Secondary | ICD-10-CM

## 2012-04-29 LAB — CBC WITH DIFFERENTIAL/PLATELET
Basophils Relative: 1 % (ref 0–1)
Eosinophils Absolute: 0 10*3/uL (ref 0.0–0.7)
Lymphs Abs: 1.5 10*3/uL (ref 0.7–4.0)
MCH: 30.2 pg (ref 26.0–34.0)
Neutrophils Relative %: 49 % (ref 43–77)
Platelets: 155 10*3/uL (ref 150–400)
RBC: 5.2 MIL/uL — ABNORMAL HIGH (ref 3.87–5.11)

## 2012-04-29 LAB — POCT I-STAT, CHEM 8
Creatinine, Ser: 0.8 mg/dL (ref 0.50–1.10)
HCT: 48 % — ABNORMAL HIGH (ref 36.0–46.0)
Hemoglobin: 16.3 g/dL — ABNORMAL HIGH (ref 12.0–15.0)
Potassium: 3.4 mEq/L — ABNORMAL LOW (ref 3.5–5.1)
Sodium: 138 mEq/L (ref 135–145)

## 2012-04-29 MED ORDER — ALBUTEROL SULFATE HFA 108 (90 BASE) MCG/ACT IN AERS
2.0000 | INHALATION_SPRAY | Freq: Once | RESPIRATORY_TRACT | Status: AC
  Administered 2012-04-29: 2 via RESPIRATORY_TRACT
  Filled 2012-04-29: qty 6.7

## 2012-04-29 MED ORDER — SODIUM CHLORIDE 0.9 % IV BOLUS (SEPSIS)
700.0000 mL | Freq: Once | INTRAVENOUS | Status: AC
  Administered 2012-04-29: 700 mL via INTRAVENOUS

## 2012-04-29 MED ORDER — IPRATROPIUM BROMIDE 0.02 % IN SOLN
0.5000 mg | Freq: Once | RESPIRATORY_TRACT | Status: AC
  Administered 2012-04-29: 0.5 mg via RESPIRATORY_TRACT
  Filled 2012-04-29: qty 2.5

## 2012-04-29 MED ORDER — ALBUTEROL SULFATE (5 MG/ML) 0.5% IN NEBU
5.0000 mg | INHALATION_SOLUTION | Freq: Once | RESPIRATORY_TRACT | Status: AC
  Administered 2012-04-29: 5 mg via RESPIRATORY_TRACT
  Filled 2012-04-29: qty 1

## 2012-04-29 MED ORDER — AEROCHAMBER Z-STAT PLUS/MEDIUM MISC
1.0000 | Freq: Once | Status: AC
  Administered 2012-04-29: 1
  Filled 2012-04-29: qty 1

## 2012-04-29 MED ORDER — METHYLPREDNISOLONE SODIUM SUCC 125 MG IJ SOLR
125.0000 mg | Freq: Once | INTRAMUSCULAR | Status: AC
  Administered 2012-04-29: 125 mg via INTRAVENOUS
  Filled 2012-04-29: qty 2

## 2012-04-29 MED ORDER — ONDANSETRON HCL 4 MG/2ML IJ SOLN
4.0000 mg | Freq: Once | INTRAMUSCULAR | Status: AC
  Administered 2012-04-29: 4 mg via INTRAVENOUS
  Filled 2012-04-29: qty 2

## 2012-04-29 NOTE — ED Provider Notes (Signed)
History     CSN: [id number]  Arrival date & time 04/29/12  1439   First MD Initiated Contact with Patient 04/29/12 1503     Chief Complaint  Patient presents with  . Nausea  . Emesis     Level V caveat for dementia  (Consider location/radiation/quality/duration/timing/severity/associated sxs/prior treatment) HPI  Patient presents via EMS from her nursing home. She states she has had a cough and per her nursing home records she has been on Levaquin for a week. She states she is still having a cough he feels is a little bit better. However she does complain of nausea for the past several days with one episode of vomiting. She denies diarrhea, fever, abdominal pain, chest pain or shortness of breath.  PCP Dr Jonny Ruiz  Past Medical History  Diagnosis Date  . HYPERLIPIDEMIA 12/20/2006  . Senile dementia, uncomplicated 01/06/2009  . INSOMNIA-SLEEP DISORDER-UNSPEC 11/09/2006  . BLURRED VISION 06/29/2008  . HYPERTENSION 11/09/2006  . CORONARY ARTERY DISEASE 07/24/2006  . ASTHMATIC BRONCHITIS, ACUTE 01/14/2007  . ALLERGIC RHINITIS 11/09/2006  . GERD 06/04/2006  . CONSTIPATION 03/01/2009  . BACK PAIN 03/01/2009  . OSTEOPOROSIS 10/13/2009  . FATIGUE 01/21/2008  . CHEST PAIN 09/12/2007  . CEREBROVASCULAR ACCIDENT, HX OF 07/24/2006  . Goiter, unspecified 02/01/2010  . Cough 02/01/2010  . Asthma   . Chronic cough     Past Surgical History  Procedure Laterality Date  . Dilation and curettage of uterus      Family History  Problem Relation Age of Onset  . Coronary artery disease Other     History  Substance Use Topics  . Smoking status: Never Smoker   . Smokeless tobacco: Never Used  . Alcohol Use: No  lives in nursing home  OB History   Grav Para Term Preterm Abortions TAB SAB Ect Mult Living                  Review of Systems  Unable to perform ROS: Dementia    Allergies  Metoclopramide hcl and Simvastatin  Home Medications   Current Outpatient Rx  Name  Route  Sig   Dispense  Refill  . alendronate (FOSAMAX) 70 MG tablet   Oral   Take 70 mg by mouth every 7 (seven) days. Take with a full glass of water on an empty stomach.         Marland Kitchen aspirin 81 MG chewable tablet   Oral   Chew 81 mg by mouth every morning.         . calcium-vitamin D (OSCAL WITH D) 500-200 MG-UNIT per tablet   Oral   Take 1 tablet by mouth daily at 6 PM.         . donepezil (ARICEPT) 10 MG tablet   Oral   Take 10 mg by mouth at bedtime.         . Fluticasone-Salmeterol (ADVAIR) 250-50 MCG/DOSE AEPB   Inhalation   Inhale 1 puff into the lungs 2 (two) times daily.         Marland Kitchen levofloxacin (LEVAQUIN) 500 MG tablet   Oral   Take 500 mg by mouth every evening.         . Memantine HCl ER (NAMENDA XR) 14 MG CP24   Oral   Take 1 tablet by mouth every morning.         . miconazole (BAZA ANTIFUNGAL) 2 % cream   Topical   Apply 1 application topically 3 (three) times daily.         Marland Kitchen  nystatin (MYCOSTATIN/NYSTOP) 100000 UNIT/GM POWD   Topical   Apply 1 g topically every morning. Under breasts and folds of stomach         . omeprazole (PRILOSEC) 20 MG capsule   Oral   Take 20 mg by mouth every morning.         . pravastatin (PRAVACHOL) 40 MG tablet   Oral   Take 40 mg by mouth every evening.          . senna-docusate (SENOKOT-S) 8.6-50 MG per tablet   Oral   Take 2 tablets by mouth at bedtime.           BP 130/51  Pulse 65  Temp(Src) 97.9 F (36.6 C) (Oral)  SpO2 96%  Vital signs normal    Physical Exam  Nursing note and vitals reviewed. Constitutional: She is oriented to person, place, and time.  Non-toxic appearance. She does not appear ill. No distress.  Frail elderly female who is coughing during my exam  HENT:  Head: Normocephalic and atraumatic.  Right Ear: External ear normal.  Left Ear: External ear normal.  Nose: Nose normal. No mucosal edema or rhinorrhea.  Mouth/Throat: Mucous membranes are normal. No dental abscesses or  edematous.  Tongue is dry  Eyes: Conjunctivae and EOM are normal. Pupils are equal, round, and reactive to light.  Neck: Normal range of motion and full passive range of motion without pain. Neck supple.  Cardiovascular: Normal rate, regular rhythm and normal heart sounds.  Exam reveals no gallop and no friction rub.   No murmur heard. Pulmonary/Chest: Effort normal. No respiratory distress. She has wheezes. She has no rhonchi. She has no rales. She exhibits no tenderness and no crepitus.  She has diminished breath sounds and rare wheezing  Abdominal: Soft. Normal appearance and bowel sounds are normal. She exhibits no distension. There is no tenderness. There is no rebound and no guarding.  Musculoskeletal: Normal range of motion. She exhibits no edema and no tenderness.  Moves all extremities well.   Neurological: She is alert and oriented to person, place, and time. She has normal strength. No cranial nerve deficit.  Skin: Skin is warm, dry and intact. No rash noted. No erythema. There is pallor.  Psychiatric: She has a normal mood and affect. Her speech is normal and behavior is normal. Her mood appears not anxious.    ED Course  Procedures (including critical care time)  Medications  albuterol (PROVENTIL) (5 MG/ML) 0.5% nebulizer solution 5 mg (5 mg Nebulization Given 04/29/12 1555)  ipratropium (ATROVENT) nebulizer solution 0.5 mg (0.5 mg Nebulization Given 04/29/12 1555)  sodium chloride 0.9 % bolus 700 mL (0 mLs Intravenous Stopped 04/29/12 1901)  ondansetron (ZOFRAN) injection 4 mg (4 mg Intravenous Given 04/29/12 1549)  albuterol (PROVENTIL) (5 MG/ML) 0.5% nebulizer solution 5 mg (5 mg Nebulization Given 04/29/12 1752)  ipratropium (ATROVENT) nebulizer solution 0.5 mg (0.5 mg Nebulization Given 04/29/12 1752)  methylPREDNISolone sodium succinate (SOLU-MEDROL) 125 mg/2 mL injection 125 mg (125 mg Intravenous Given 04/29/12 1857)  albuterol (PROVENTIL HFA;VENTOLIN HFA) 108 (90 BASE)  MCG/ACT inhaler 2 puff (2 puffs Inhalation Given 04/29/12 1858)  aerochamber Z-Stat Plus/medium 1 each (1 each Other Given 04/29/12 1858)   Patient given IV fluid bolus slowly because of her history of congestive heart failure.  She was rechecked after her first nebulizer treatment at 1740. She continues to have diffuse scattered wheezing and coarse breath sounds. Will repeat her nebulizer treatment.  Recheck at 1900. Patient still has  some scattered wheezing. Her son reports when she tried to get up to use the bathroom she was very lightheaded and would need assistance to go to the bathroom. Orthostatic vital signs were done. Her blood pressure dropped from 148 systolic to 93 systolic on standing. Her heart rate went from 64-118 on standing. She was given Solu-Medrol IV for her persistent wheezing and bronchospasm. It was felt this point she would need to be admitted.  21:19 Dr Conley Rolls will see patient.   Results for orders placed during the hospital encounter of 04/29/12  CBC WITH DIFFERENTIAL      Result Value Range   WBC 4.0  4.0 - 10.5 K/uL   RBC 5.20 (*) 3.87 - 5.11 MIL/uL   Hemoglobin 15.7 (*) 12.0 - 15.0 g/dL   HCT 96.0 (*) 45.4 - 09.8 %   MCV 90.2  78.0 - 100.0 fL   MCH 30.2  26.0 - 34.0 pg   MCHC 33.5  30.0 - 36.0 g/dL   RDW 11.9  14.7 - 82.9 %   Platelets 155  150 - 400 K/uL   Neutrophils Relative 49  43 - 77 %   Neutro Abs 2.0  1.7 - 7.7 K/uL   Lymphocytes Relative 38  12 - 46 %   Lymphs Abs 1.5  0.7 - 4.0 K/uL   Monocytes Relative 12  3 - 12 %   Monocytes Absolute 0.5  0.1 - 1.0 K/uL   Eosinophils Relative 0  0 - 5 %   Eosinophils Absolute 0.0  0.0 - 0.7 K/uL   Basophils Relative 1  0 - 1 %   Basophils Absolute 0.0  0.0 - 0.1 K/uL  POCT I-STAT, CHEM 8      Result Value Range   Sodium 138  135 - 145 mEq/L   Potassium 3.4 (*) 3.5 - 5.1 mEq/L   Chloride 100  96 - 112 mEq/L   BUN 19  6 - 23 mg/dL   Creatinine, Ser 5.62  0.50 - 1.10 mg/dL   Glucose, Bld 99  70 - 99 mg/dL    Calcium, Ion 1.30  8.65 - 1.30 mmol/L   TCO2 30  0 - 100 mmol/L   Hemoglobin 16.3 (*) 12.0 - 15.0 g/dL   HCT 78.4 (*) 69.6 - 29.5 %    Laboratory interpretation all normal except mild hypokalemia, concentrated hemoglobin consistent with dehydration  Dg Chest 2 View  04/29/2012  *RADIOLOGY REPORT*  Clinical Data: Cough and wheezing.  CHEST - 2 VIEW  Comparison: 08/02/2009  Findings: Two views of the chest demonstrate median sternotomy wires.  Stable appearance of the heart and mediastinum.  Stable calcifications along the anterior lower ribs.  The thoracic aorta is heavily calcified.  Lungs are clear without airspace disease or edema.  IMPRESSION: No acute cardiopulmonary disease.   Original Report Authenticated By: Richarda Overlie, M.D.      1. Nausea and vomiting   2. Dehydration   3. Bronchitis, acute, with bronchospasm   4. Orthostatic hypotension   5. Cough variant asthma    Plan admission   Devoria Albe, MD, FACEP    MDM          Ward Givens, MD 04/30/12 0005

## 2012-04-29 NOTE — ED Notes (Signed)
PER EMS- pt picked up from [facility] with c/o n/v x1 day.  Pt has been on Levaquin x1 week being treated for bronchitis.  Pt also has decreased appetite.  Hx of dementia.  Alert and oriented per baseline.

## 2012-04-29 NOTE — ED Notes (Signed)
5E will open after 11.  Pt will then be assigned at bed.

## 2012-04-29 NOTE — H&P (Signed)
[facility] History and Physical  [name]:[id number] DOB: [birth year]    PCP:   Oliver Barre, MD   Chief Complaint: nausea and cough.  HPI: [name] is an 79 y.o. female with hx of dementia, NH resident, hx of chronic cough felt to be "asthma variant", HTN, CAD, GERD, Goiter, prior CVA, brought into the ER because she was having nausea and not able to take adequate oral intake.  She has these "coughing fits" which are at times incessant.  Son had told me that she had tests done, but no cause was found.  She has some shortness of breath and no chest pain.  There has been no diarrhea, abdominal pain, fever or chills.  Evaluation in the ER showed normal chemistry, no leukocytosis, and clear CXR.  She was asked to ambulate, but when she stood up, she became lightheaded and has orthostatic BP changes.  She was given IV steroids and neb for her wheezing, some IVF and hospitalist was asked to admit her for dehydration and asthmatic exacerbation.    Rewiew of Systems:  Constitutional: Negative for malaise, fever and chills. No significant weight loss or weight gain Eyes: Negative for eye pain, redness and discharge, diplopia, visual changes, or flashes of light. ENMT: Negative for ear pain, hoarseness, nasal congestion, sinus pressure and sore throat. No headaches; tinnitus, drooling, or problem swallowing. Cardiovascular: Negative for chest pain, palpitations, diaphoresis,  and peripheral edema. ; No orthopnea, PND Respiratory: Negative for  hemoptysis, wheezing and stridor. No pleuritic chestpain. Gastrointestinal: Negative for nausea,  diarrhea, constipation, abdominal pain, melena, blood in stool, hematemesis, jaundice and rectal bleeding.    Genitourinary: Negative for frequency, dysuria, incontinence,flank pain and hematuria; Musculoskeletal: Negative for back pain and neck pain. Negative for swelling and trauma.;  Skin: . Negative for pruritus, rash, abrasions, bruising and skin  lesion.; ulcerations Neuro: Negative for headache, and neck stiffness. Negative for weakness, altered level of consciousness , altered mental status, extremity weakness, burning feet, involuntary movement, seizure and syncope.  Psych: negative for anxiety, depression, insomnia, tearfulness, panic attacks, hallucinations, paranoia, suicidal or homicidal ideation    Past Medical History  Diagnosis Date  . HYPERLIPIDEMIA 12/20/2006  . Senile dementia, uncomplicated 01/06/2009  . INSOMNIA-SLEEP DISORDER-UNSPEC 11/09/2006  . BLURRED VISION 06/29/2008  . HYPERTENSION 11/09/2006  . CORONARY ARTERY DISEASE 07/24/2006  . ASTHMATIC BRONCHITIS, ACUTE 01/14/2007  . ALLERGIC RHINITIS 11/09/2006  . GERD 06/04/2006  . CONSTIPATION 03/01/2009  . BACK PAIN 03/01/2009  . OSTEOPOROSIS 10/13/2009  . FATIGUE 01/21/2008  . CHEST PAIN 09/12/2007  . CEREBROVASCULAR ACCIDENT, HX OF 07/24/2006  . Goiter, unspecified 02/01/2010  . Cough 02/01/2010  . Asthma   . Chronic cough     Past Surgical History  Procedure Laterality Date  . Dilation and curettage of uterus      Medications:  HOME MEDS: Prior to Admission medications   Medication Sig Start Date End Date Taking? Authorizing Provider  alendronate (FOSAMAX) 70 MG tablet Take 70 mg by mouth every 7 (seven) days. Take with a full glass of water on an empty stomach.   Yes Historical Provider, MD  aspirin 81 MG chewable tablet Chew 81 mg by mouth every morning.   Yes Historical Provider, MD  calcium-vitamin D (OSCAL WITH D) 500-200 MG-UNIT per tablet Take 1 tablet by mouth daily at 6 PM.   Yes Historical Provider, MD  donepezil (ARICEPT) 10 MG tablet Take 10 mg by mouth at bedtime. 01/30/11  Yes Corwin Levins,  MD  Fluticasone-Salmeterol (ADVAIR) 250-50 MCG/DOSE AEPB Inhale 1 puff into the lungs 2 (two) times daily. 10/10/10 04/29/12 Yes Corwin Levins, MD  levofloxacin (LEVAQUIN) 500 MG tablet Take 500 mg by mouth every evening. 04/28/12 05/03/12 Yes Historical Provider, MD   Memantine HCl ER (NAMENDA XR) 14 MG CP24 Take 1 tablet by mouth every morning.   Yes Historical Provider, MD  miconazole (BAZA ANTIFUNGAL) 2 % cream Apply 1 application topically 3 (three) times daily.   Yes Historical Provider, MD  nystatin (MYCOSTATIN/NYSTOP) 100000 UNIT/GM POWD Apply 1 g topically every morning. Under breasts and folds of stomach   Yes Historical Provider, MD  omeprazole (PRILOSEC) 20 MG capsule Take 20 mg by mouth every morning.   Yes Historical Provider, MD  pravastatin (PRAVACHOL) 40 MG tablet Take 40 mg by mouth every evening.    Yes Historical Provider, MD  senna-docusate (SENOKOT-S) 8.6-50 MG per tablet Take 2 tablets by mouth at bedtime.   Yes Historical Provider, MD     Allergies:  Allergies  Allergen Reactions  . Metoclopramide Hcl   . Simvastatin     Social History:   reports that she has never smoked. She has never used smokeless tobacco. She reports that she does not drink alcohol or use illicit drugs.  Family History: Family History  Problem Relation Age of Onset  . Coronary artery disease Other      Physical Exam: Filed Vitals:   04/29/12 1943 04/29/12 2034 04/29/12 2036 04/29/12 2039  BP: 118/104 148/89 133/66 93/52  Pulse: 96 64 109 118  Temp:      TempSrc:      Resp: 16     SpO2: 97%      Blood pressure 93/52, pulse 118, temperature 97.9 F (36.6 C), temperature source Oral, resp. rate 16, SpO2 97.00%.  GEN:  Pleasant  patient lying in the stretcher in no acute distress; cooperative with exam. PSYCH:  alert and oriented x4; does not appear anxious or depressed; affect is appropriate. HEENT: Mucous membranes pink and anicteric; PERRLA; EOM intact; no cervical lymphadenopathy nor thyromegaly or carotid bruit; no JVD; There were no stridor. Neck is very supple. Breasts:: Not examined CHEST WALL: No tenderness CHEST: Normal respiration, except with bialteral wheezing, no rales. HEART: Regular rate and rhythm.  There are no murmur, rub,  or gallops.   BACK: No kyphosis or scoliosis; no CVA tenderness ABDOMEN: soft and non-tender; no masses, no organomegaly, normal abdominal bowel sounds; no pannus; no intertriginous candida. There is no rebound and no distention. Rectal Exam: Not done EXTREMITIES: No bone or joint deformity; age-appropriate arthropathy of the hands and knees; no edema; no ulcerations.  There is no calf tenderness. Genitalia: not examined PULSES: 2+ and symmetric SKIN: slight poor skin turgor.  no rash or ulceration CNS: Cranial nerves 2-12 grossly intact no focal lateralizing neurologic deficit.  Speech is fluent; uvula elevated with phonation, facial symmetry and tongue midline. DTR are normal bilaterally, cerebella exam is intact, barbinski is negative and strengths are equaled bilaterally.  No sensory loss.   Labs on Admission:  Basic Metabolic Panel:  Recent Labs Lab 04/29/12 1617  NA 138  K 3.4*  CL 100  GLUCOSE 99  BUN 19  CREATININE 0.80   Liver Function Tests: No results found for this basename: AST, ALT, ALKPHOS, BILITOT, PROT, ALBUMIN,  in the last 168 hours No results found for this basename: LIPASE, AMYLASE,  in the last 168 hours No results found for this basename: AMMONIA,  in the last 168 hours CBC:  Recent Labs Lab 04/29/12 1531 04/29/12 1617  WBC 4.0  --   NEUTROABS 2.0  --   HGB 15.7* 16.3*  HCT 46.9* 48.0*  MCV 90.2  --   PLT 155  --    Cardiac Enzymes: No results found for this basename: CKTOTAL, CKMB, CKMBINDEX, TROPONINI,  in the last 168 hours  CBG: No results found for this basename: GLUCAP,  in the last 168 hours   Radiological Exams on Admission: Dg Chest 2 View  04/29/2012  *RADIOLOGY REPORT*  Clinical Data: Cough and wheezing.  CHEST - 2 VIEW  Comparison: 08/02/2009  Findings: Two views of the chest demonstrate median sternotomy wires.  Stable appearance of the heart and mediastinum.  Stable calcifications along the anterior lower ribs.  The thoracic aorta  is heavily calcified.  Lungs are clear without airspace disease or edema.  IMPRESSION: No acute cardiopulmonary disease.   Original Report Authenticated By: Richarda Overlie, M.D.      Assessment/Plan Present on Admission:  . Senile dementia, uncomplicated . HYPERTENSION . Cough variant asthma . ASTHMA . HYPERLIPIDEMIA . INSOMNIA-SLEEP DISORDER-UNSPEC . Dehydration, moderate . GERD  PLAN:  Will admit her for slight dehydration.  For her coughs, it has been chronic and may be because of GERD, post nasal drip, or asthma variant.  I think she should have a speech therapy for swallow evaluation to be sure she doesn't have chronic aspiration.  Will continue with steroids, neb, and start her on IV Zenicef.  She is otherwise stable, and her meds will be continued.  Will admit her to telemetry under [facility] service.  I did have a chance to discuss her code status with her and her son tonight.  She would like to be DNR.  We will honor her wish.  Thank you for allowing me to partake in the care of this nice patient.  Other plans as per orders.  Code Status: DNR.   Houston Siren, MD. [facility] Pager [phone] 7pm to 7am.  04/29/2012, 10:48 PM

## 2012-04-29 NOTE — ED Notes (Signed)
[id number]<EX> Expected date:<BR> Expected time:<BR> Means of arrival:<BR> Comments:<BR> Hold room

## 2012-04-30 ENCOUNTER — Inpatient Hospital Stay (HOSPITAL_COMMUNITY): Payer: Medicare Other

## 2012-04-30 DIAGNOSIS — R112 Nausea with vomiting, unspecified: Secondary | ICD-10-CM

## 2012-04-30 DIAGNOSIS — F0390 Unspecified dementia without behavioral disturbance: Secondary | ICD-10-CM

## 2012-04-30 DIAGNOSIS — K219 Gastro-esophageal reflux disease without esophagitis: Secondary | ICD-10-CM

## 2012-04-30 MED ORDER — PREDNISONE 20 MG PO TABS
20.0000 mg | ORAL_TABLET | Freq: Two times a day (BID) | ORAL | Status: DC
  Administered 2012-04-30 – 2012-05-01 (×2): 20 mg via ORAL
  Filled 2012-04-30 (×4): qty 1

## 2012-04-30 MED ORDER — GUAIFENESIN-DM 100-10 MG/5ML PO SYRP
5.0000 mL | ORAL_SOLUTION | ORAL | Status: DC | PRN
  Administered 2012-04-30: 5 mL via ORAL
  Filled 2012-04-30: qty 10

## 2012-04-30 MED ORDER — PANTOPRAZOLE SODIUM 40 MG PO TBEC
80.0000 mg | DELAYED_RELEASE_TABLET | Freq: Every day | ORAL | Status: DC
  Administered 2012-04-30 – 2012-05-02 (×3): 80 mg via ORAL
  Filled 2012-04-30 (×4): qty 2

## 2012-04-30 MED ORDER — ALBUTEROL SULFATE (5 MG/ML) 0.5% IN NEBU
2.5000 mg | INHALATION_SOLUTION | Freq: Four times a day (QID) | RESPIRATORY_TRACT | Status: DC
  Filled 2012-04-30: qty 0.5

## 2012-04-30 MED ORDER — METHYLPREDNISOLONE SODIUM SUCC 125 MG IJ SOLR
60.0000 mg | INTRAMUSCULAR | Status: DC
  Administered 2012-04-30 (×3): 60 mg via INTRAVENOUS
  Filled 2012-04-30 (×8): qty 0.96

## 2012-04-30 MED ORDER — CALCIUM CARBONATE-VITAMIN D 500-200 MG-UNIT PO TABS
1.0000 | ORAL_TABLET | Freq: Every day | ORAL | Status: DC
  Administered 2012-04-30 – 2012-05-01 (×2): 1 via ORAL
  Filled 2012-04-30 (×3): qty 1

## 2012-04-30 MED ORDER — MEMANTINE HCL ER 14 MG PO CP24
1.0000 | ORAL_CAPSULE | Freq: Every morning | ORAL | Status: DC
  Administered 2012-04-30 – 2012-05-02 (×3): 1 via ORAL

## 2012-04-30 MED ORDER — MICONAZOLE NITRATE 2 % EX CREA
1.0000 "application " | TOPICAL_CREAM | Freq: Three times a day (TID) | CUTANEOUS | Status: DC
  Administered 2012-04-30 – 2012-05-02 (×7): 1 via TOPICAL
  Filled 2012-04-30: qty 14

## 2012-04-30 MED ORDER — ASPIRIN 81 MG PO CHEW
81.0000 mg | CHEWABLE_TABLET | Freq: Every morning | ORAL | Status: DC
  Administered 2012-04-30 – 2012-05-02 (×3): 81 mg via ORAL
  Filled 2012-04-30 (×3): qty 1

## 2012-04-30 MED ORDER — LORAZEPAM 2 MG/ML IJ SOLN
0.2500 mg | Freq: Once | INTRAMUSCULAR | Status: AC
  Administered 2012-04-30: 0.25 mg via INTRAVENOUS
  Filled 2012-04-30: qty 1

## 2012-04-30 MED ORDER — DEXTROSE-NACL 5-0.9 % IV SOLN
INTRAVENOUS | Status: DC
  Administered 2012-04-30 – 2012-05-02 (×4): via INTRAVENOUS

## 2012-04-30 MED ORDER — DEXTROSE 5 % IV SOLN
750.0000 mg | Freq: Three times a day (TID) | INTRAVENOUS | Status: DC
  Administered 2012-04-30 – 2012-05-01 (×5): 750 mg via INTRAVENOUS
  Filled 2012-04-30 (×7): qty 750

## 2012-04-30 MED ORDER — ALENDRONATE SODIUM 70 MG PO TABS: 70.0000 mg | ORAL_TABLET | ORAL | Status: DC

## 2012-04-30 MED ORDER — ENOXAPARIN SODIUM 40 MG/0.4ML ~~LOC~~ SOLN
40.0000 mg | SUBCUTANEOUS | Status: DC
  Administered 2012-04-30 – 2012-05-02 (×3): 40 mg via SUBCUTANEOUS
  Filled 2012-04-30 (×3): qty 0.4

## 2012-04-30 MED ORDER — ONDANSETRON HCL 4 MG PO TABS: 4.0000 mg | ORAL_TABLET | Freq: Four times a day (QID) | ORAL | Status: DC | PRN

## 2012-04-30 MED ORDER — ONDANSETRON HCL 4 MG/2ML IJ SOLN
4.0000 mg | Freq: Four times a day (QID) | INTRAMUSCULAR | Status: DC | PRN
  Administered 2012-04-30: 4 mg via INTRAVENOUS
  Filled 2012-04-30: qty 2

## 2012-04-30 MED ORDER — ALBUTEROL SULFATE (5 MG/ML) 0.5% IN NEBU: 2.5000 mg | INHALATION_SOLUTION | RESPIRATORY_TRACT | Status: DC | PRN

## 2012-04-30 MED ORDER — SODIUM CHLORIDE 0.9 % IJ SOLN
3.0000 mL | Freq: Two times a day (BID) | INTRAMUSCULAR | Status: DC
  Administered 2012-04-30 (×2): 3 mL via INTRAVENOUS

## 2012-04-30 MED ORDER — SENNOSIDES-DOCUSATE SODIUM 8.6-50 MG PO TABS
2.0000 | ORAL_TABLET | Freq: Every day | ORAL | Status: DC
  Administered 2012-04-30 – 2012-05-01 (×2): 2 via ORAL
  Filled 2012-04-30 (×3): qty 2

## 2012-04-30 MED ORDER — SIMVASTATIN 20 MG PO TABS: 20.0000 mg | ORAL_TABLET | Freq: Every day | ORAL | Status: DC

## 2012-04-30 MED ORDER — DOCUSATE SODIUM 100 MG PO CAPS
100.0000 mg | ORAL_CAPSULE | Freq: Two times a day (BID) | ORAL | Status: DC
  Administered 2012-04-30 – 2012-05-02 (×5): 100 mg via ORAL
  Filled 2012-04-30 (×6): qty 1

## 2012-04-30 MED ORDER — NYSTATIN 100000 UNIT/GM EX POWD
1.0000 g | Freq: Every morning | CUTANEOUS | Status: DC
  Administered 2012-04-30 – 2012-05-02 (×3): 1 g via TOPICAL
  Filled 2012-04-30: qty 15

## 2012-04-30 MED ORDER — ALBUTEROL SULFATE (5 MG/ML) 0.5% IN NEBU
2.5000 mg | INHALATION_SOLUTION | Freq: Four times a day (QID) | RESPIRATORY_TRACT | Status: DC
  Administered 2012-04-30 (×4): 2.5 mg via RESPIRATORY_TRACT
  Filled 2012-04-30 (×4): qty 0.5

## 2012-04-30 MED ORDER — MOMETASONE FURO-FORMOTEROL FUM 100-5 MCG/ACT IN AERO
2.0000 | INHALATION_SPRAY | Freq: Two times a day (BID) | RESPIRATORY_TRACT | Status: DC
  Administered 2012-04-30 – 2012-05-02 (×6): 2 via RESPIRATORY_TRACT
  Filled 2012-04-30: qty 8.8

## 2012-04-30 MED ORDER — PRAVASTATIN SODIUM 40 MG PO TABS
40.0000 mg | ORAL_TABLET | Freq: Every day | ORAL | Status: DC
  Administered 2012-04-30 – 2012-05-01 (×2): 40 mg via ORAL
  Filled 2012-04-30 (×3): qty 1

## 2012-04-30 MED ORDER — DONEPEZIL HCL 10 MG PO TABS
10.0000 mg | ORAL_TABLET | Freq: Every day | ORAL | Status: DC
  Administered 2012-04-30 – 2012-05-01 (×3): 10 mg via ORAL
  Filled 2012-04-30 (×4): qty 1

## 2012-04-30 NOTE — Progress Notes (Signed)
Clinical Social Work Department BRIEF PSYCHOSOCIAL ASSESSMENT 04/30/2012  Patient:  [name], [name]     Account Number:  [id number]     Admit date:  04/29/2012  Clinical Social Worker:  Dennison Bulla  Date/Time:  04/30/2012 09:30 AM  Referred by:  Physician  Date Referred:  04/30/2012 Referred for  ALF Placement   Other Referral:   Interview type:  Patient Other interview type:    PSYCHOSOCIAL DATA Living Status:  FACILITY Admitted from facility:   [facility] Level of care:  Assisted Living Primary support name:  Gaspar Garbe Primary support relationship to patient:  CHILD, ADULT Degree of support available:   Strong    CURRENT CONCERNS Current Concerns  Post-Acute Placement   Other Concerns:    SOCIAL WORK ASSESSMENT / PLAN CSW received referral due to patient being admitted from ALF. CSW reviewed chart and met with patient at bedside. CSW introduced myself and explained role.    Patient alert but per RN, patient can be confused at times. Patient knew she lived at a [facility] [facility] but was unable to remember the name. Patient asked CSW to contact son for further questions. CSW spoke with son via phone who confirmed that patient lives at [facility] and has been there for about 2 years. Son reports that patient has been doing well and wants her to return at dc. CSW explained CSW role in dc planning and left CSW contact information in room for family.    CSW spoke with ALF who reports that patient was ambulatory and independent prior to being sick. Patient has been less active and requiring 1 person assist for the past few weeks. Patient last received PT at ALF about 2 months ago. ALF agreeable to patient returning at dc.    CSW completed FL2 and placed in chart for MD signature. CSW will continue to follow.   Assessment/plan status:  Psychosocial Support/Ongoing Assessment of Needs Other assessment/ plan:   Information/referral to community  resources:   Will return to ALF    PATIENT'S/FAMILY'S RESPONSE TO PLAN OF CARE: Patient alert but prefers son to make decisions. Son engaged throughout assessment and thanked CSW for call. Son and patient agreeable to return to ALF at dc.

## 2012-04-30 NOTE — Progress Notes (Signed)
PHARMACIST - PHYSICIAN COMMUNICATION  CONCERNING: P&T Medication Policy Regarding Oral Bisphosphonates  RECOMMENDATION: Your order for alendronate (Fosamax), ibandronate (Boniva), or risedronate (Actonel) has been discontinued at this time.  If the patient's post-hospital medical condition warrants safe use of this class of drugs, please resume the pre-hospital regimen upon discharge.  DESCRIPTION:  Alendronate (Fosamax), ibandronate (Boniva), and risedronate (Actonel) can cause severe esophageal erosions in patients who are unable to remain upright at least 30 minutes after taking this medication.   Since brief interruptions in therapy are thought to have minimal impact on bone mineral density, the Pharmacy & Therapeutics Committee has established that bisphosphonate orders should be routinely discontinued during hospitalization.   To override this safety policy and permit administration of Boniva, Fosamax, or Actonel in the hospital, prescribers must write "DO NOT HOLD" in the comments section when placing the order for this class of medications.  [name] 04/30/2012 12:53 AM

## 2012-04-30 NOTE — Procedures (Signed)
Objective Swallowing Evaluation: Modified Barium Swallowing Study  Patient Details  Name: [name] MRN: [id number] Date of Birth: [birth year]  Today's Date: 04/30/2012 Time: 4098-1191 SLP Time Calculation (min): 20 min  Past Medical History:  Past Medical History  Diagnosis Date  . HYPERLIPIDEMIA 12/20/2006  . Senile dementia, uncomplicated 01/06/2009  . INSOMNIA-SLEEP DISORDER-UNSPEC 11/09/2006  . BLURRED VISION 06/29/2008  . HYPERTENSION 11/09/2006  . CORONARY ARTERY DISEASE 07/24/2006  . ASTHMATIC BRONCHITIS, ACUTE 01/14/2007  . ALLERGIC RHINITIS 11/09/2006  . GERD 06/04/2006  . CONSTIPATION 03/01/2009  . BACK PAIN 03/01/2009  . OSTEOPOROSIS 10/13/2009  . FATIGUE 01/21/2008  . CHEST PAIN 09/12/2007  . CEREBROVASCULAR ACCIDENT, HX OF 07/24/2006  . Goiter, unspecified 02/01/2010  . Cough 02/01/2010  . Asthma   . Chronic cough    Past Surgical History:  Past Surgical History  Procedure Laterality Date  . Dilation and curettage of uterus     HPI:  Pt is a 79 yo female adm to [facility] with chronic cough, ? asthma varient?  CXR was negative for acute pulmonary disease.  Pt's PMH + for CVA, dementia, CAD, insomnia, GERD (on PPI).  Diet at SNF is NAS/regular.  Md ordered MBS due to concerns pt may have chronic aspiration.  Pt denies problems coughing or choking with intake but did admit to reflux symptoms when SLP inquired.       Assessment / Plan / Recommendation Clinical Impression  Dysphagia Diagnosis: Mild oral phase dysphagia;Suspected primary esophageal dysphagia  Clinical impression: Pt presents with mild oral deficits consistent with findings of dysphagia and dementia.  Mild delay in oral transit with piecemeal deglutition noted.  Pharyngeal swallow was strong with only minimal stasis.  Therapeutic intervention included educating pt to strategies to decrease aspiration risk based on MBS today including conducting intermittent dry swallow.  Pt able to swallow barium tablet that cleared  through pharynx and esophagus without delay.    Primary aspiration risk likley due to known GERD, please see esophageal phase statement of this test.   Pt also admits to reflux symptoms, she may benefit from management to decrease symptoms.    Rec pt have regular/thin diet with strict reflux precautions.  SLP to follow briefly for family education.      Treatment Recommendation    TBD   Diet Recommendation Regular;Thin liquid (several small meals as able)   Liquid Administration via: Cup;Straw Medication Administration: Whole meds with liquid Supervision: Patient able to self feed;Intermittent supervision to cue for compensatory strategies Compensations: Slow rate;Small sips/bites (intermittent dry swallow) Postural Changes and/or Swallow Maneuvers: Seated upright 90 degrees;Upright 30-60 min after meal    Other  Recommendations Oral Care Recommendations: Oral care QID   Follow Up Recommendations    TBD   Frequency and Duration min 1 x/week  1 week   Pertinent Vitals/Pain Afebrile,decreased    SLP Swallow Goals Patient will utilize recommended strategies during swallow to increase swallowing safety with: Moderate cueing (given dementia dx) Goal #3: Family will verbalize findings of MBS report and strategies to mitigate asp risk with min assist.    General Date of Onset: 04/30/12 HPI: Pt is a 79 yo female adm to [facility] with chronic cough, ? asthma varient?  CXR was negative for acute pulmonary disease.  Pt's PMH + for CVA, dementia, CAD, insomnia, GERD (on PPI).  Diet at SNF is NAS/regular.  Md ordered MBS due to concerns pt may have chronic aspiration.  Pt denies problems coughing or choking with intake but did  admit to reflux symptoms when SLP inquired.   Type of Study: Modified Barium Swallowing Study Reason for Referral: Objectively evaluate swallowing function Diet Prior to this Study: Dysphagia 3 (soft);Thin liquids Temperature Spikes Noted: No Respiratory Status: Room  air History of Recent Intubation: No Behavior/Cognition: Alert;Cooperative;Pleasant mood;Requires cueing (pt has dementia) Oral Cavity - Dentition: Dentures, bottom;Dentures, top Oral Motor / Sensory Function:  (lingual deviation to left upon protrusion) Self-Feeding Abilities: Able to feed self;Needs set up Patient Positioning: Upright in chair Baseline Vocal Quality: Clear Volitional Cough: Strong Volitional Swallow: Able to elicit Anatomy: Within functional limits Pharyngeal Secretions: Not observed secondary MBS    Reason for Referral Objectively evaluate swallowing function   Oral Phase Oral Preparation/Oral Phase Oral Phase: Impaired Oral - Nectar Oral - Nectar Cup: Delayed oral transit;Piecemeal swallowing Oral - Thin Oral - Thin Cup: Delayed oral transit;Piecemeal swallowing Oral - Thin Straw: Delayed oral transit;Piecemeal swallowing Oral - Solids Oral - Puree: Delayed oral transit;Piecemeal swallowing Oral - Regular: Delayed oral transit;Piecemeal swallowing Oral - Pill: Delayed oral transit   Pharyngeal Phase Pharyngeal Phase Pharyngeal Phase: Within functional limits  Cervical Esophageal Phase    GO    Cervical Esophageal Phase Cervical Esophageal Phase: Impaired Cervical Esophageal Phase - Comment Cervical Esophageal Comment: Appearance of minimal amount of stasis at distal esophagus with liquids and ? tertiary contractions with minimal backflow, pt did not sense stasis,  cracker and pudding appeared to readily clear,  findings may be consistent with dysmotility, radiologist not present to confirm         Donavan Burnet, MS [facility] SLP [phone]

## 2012-04-30 NOTE — Progress Notes (Signed)
[facility] PROGRESS NOTE  [name] ION:[id number] DOB: May 18, 1930 DOA: 04/29/2012 PCP: Oliver Barre, MD  HPI: 79 y.o. female with hx of dementia, NH resident, hx of chronic cough felt to be "asthma variant", HTN, CAD, GERD, Goiter, prior CVA, brought into the ER because she was having nausea and not able to take adequate oral intake. She has these "coughing fits" which are at times incessant. Son had told me that she had tests done, but no cause was found. She has some shortness of breath and no chest pain. There has been no diarrhea, abdominal pain, fever or chills. Evaluation in the ER showed normal chemistry, no leukocytosis, and clear CXR. She was asked to ambulate, but when she stood up, she became lightheaded and has orthostatic BP changes. She was given IV steroids and neb for her wheezing, some IVF and hospitalist was asked to admit her for dehydration and asthmatic exacerbation.   Assessment/Plan: Asthma exacerbation - on IV steroids, will transition to po prednisone - still with diffuse wheezing on exam, albuterol prn - on Cefuroxime for productive cough, likely can be changed to po antibiotic on d/c - will walk to PT/OT to see sats on ambulation  HLD - pravastatin GERD - protonix CAD - Aspirin 81 mg daily Dementia - continue Memantine, Aricept DVT Prophylaxis - Lovenox  Code Status: DNR Family Communication: none  Disposition Plan: home 1-2 days  Consultants:  none  Procedures:  none  Antibiotics:  Anti-infectives   Start     Dose/Rate Route Frequency Ordered Stop   04/30/12 0200  cefUROXime (ZINACEF) 750 mg in dextrose 5 % 50 mL IVPB     750 mg 100 mL/hr over 30 Minutes Intravenous Every 8 hours 04/30/12 0047       Antibiotics Given (last 72 hours)   Date/Time Action Medication Dose Rate   04/30/12 0202 Given   cefUROXime (ZINACEF) 750 mg in dextrose 5 % 50 mL IVPB 750 mg 100 mL/hr   04/30/12 0902 Given   cefUROXime (ZINACEF) 750 mg in dextrose 5 % 50  mL IVPB 750 mg 100 mL/hr     HPI/Subjective: - fees well this morning, continues to have cough and wheezing but overall much improved since last night.   Objective: Filed Vitals:   04/30/12 0006 04/30/12 0046 04/30/12 0605 04/30/12 0900  BP: 125/42  138/87   Pulse: 93     Temp:   97.7 F (36.5 C)   TempSrc:      Resp: 16  16   Height:  5\' 2"  (1.575 m)    Weight:  68.7 kg (151 lb 7.3 oz)    SpO2: 93%  93% 95%    Intake/Output Summary (Last 24 hours) at 04/30/12 1236 Last data filed at 04/30/12 0919  Gross per 24 hour  Intake      0 ml  Output      2 ml  Net     -2 ml   Filed Weights   04/30/12 0046  Weight: 68.7 kg (151 lb 7.3 oz)   Exam:  General:  NAD  Cardiovascular: regular rate and rhythm, without MRG  Respiratory: good air movement, diffuse wheezing throughout.   Abdomen: soft, not tender to palpation, positive bowel sounds  MSK: no peripheral edema  Neuro: CN 2-12 grossly intact, MS 5/5 in all 4  Data Reviewed: Basic Metabolic Panel:  Recent Labs Lab 04/29/12 1617  NA 138  K 3.4*  CL 100  GLUCOSE 99  BUN 19  CREATININE 0.80   CBC:  Recent Labs Lab 04/29/12 1531 04/29/12 1617  WBC 4.0  --   NEUTROABS 2.0  --   HGB 15.7* 16.3*  HCT 46.9* 48.0*  MCV 90.2  --   PLT 155  --    Studies: Dg Chest 2 View  04/29/2012  *RADIOLOGY REPORT*  Clinical Data: Cough and wheezing.  CHEST - 2 VIEW  Comparison: 08/02/2009  Findings: Two views of the chest demonstrate median sternotomy wires.  Stable appearance of the heart and mediastinum.  Stable calcifications along the anterior lower ribs.  The thoracic aorta is heavily calcified.  Lungs are clear without airspace disease or edema.  IMPRESSION: No acute cardiopulmonary disease.   Original Report Authenticated By: Richarda Overlie, M.D.     Scheduled Meds: . albuterol  2.5 mg Nebulization Q6H  . aspirin  81 mg Oral q morning - 10a  . calcium-vitamin D  1 tablet Oral q1800  . cefUROXime (ZINACEF)  IV  750  mg Intravenous Q8H  . docusate sodium  100 mg Oral BID  . donepezil  10 mg Oral QHS  . enoxaparin (LOVENOX) injection  40 mg Subcutaneous Q24H  . Memantine HCl ER  1 capsule Oral q morning - 10a  . methylPREDNISolone (SOLU-MEDROL) injection  60 mg Intravenous Q4H  . miconazole  1 application Topical TID  . mometasone-formoterol  2 puff Inhalation BID  . nystatin  1 g Topical q morning - 10a  . pantoprazole  80 mg Oral Daily  . pravastatin  40 mg Oral QPC supper  . senna-docusate  2 tablet Oral QHS  . sodium chloride  3 mL Intravenous Q12H   Continuous Infusions: . dextrose 5 % and 0.9% NaCl 75 mL/hr at 04/30/12 0138    Active Problems:   HYPERLIPIDEMIA   Senile dementia, uncomplicated   INSOMNIA-SLEEP DISORDER-UNSPEC   HYPERTENSION   Cough variant asthma   ASTHMA   GERD   Dehydration, moderate  Time spent: 25  Pamella Pert, MD [facility] Pager [phone]. If 7 PM - 7 AM, please contact night-coverage at [email or web address], password [facility] 04/30/2012, 12:36 PM  LOS: 1 day

## 2012-05-01 ENCOUNTER — Encounter (HOSPITAL_COMMUNITY): Payer: Self-pay | Admitting: *Deleted

## 2012-05-01 DIAGNOSIS — I951 Orthostatic hypotension: Secondary | ICD-10-CM

## 2012-05-01 LAB — BASIC METABOLIC PANEL
BUN: 8 mg/dL (ref 6–23)
Chloride: 110 mEq/L (ref 96–112)
Creatinine, Ser: 0.55 mg/dL (ref 0.50–1.10)
GFR calc Af Amer: >90 mL/min (ref >90)
GFR calc non Af Amer: 87 mL/min — ABNORMAL LOW (ref >90)
Glucose, Bld: 159 mg/dL — ABNORMAL HIGH (ref 70–99)

## 2012-05-01 LAB — CBC
HCT: 37.9 % (ref 36.0–46.0)
Hemoglobin: 12.5 g/dL (ref 12.0–15.0)
MCHC: 33 g/dL (ref 30.0–36.0)
MCV: 89.2 fL (ref 78.0–100.0)
RDW: 12.7 % (ref 11.5–15.5)

## 2012-05-01 MED ORDER — METHYLPREDNISOLONE SODIUM SUCC 125 MG IJ SOLR
60.0000 mg | Freq: Three times a day (TID) | INTRAMUSCULAR | Status: DC
  Administered 2012-05-01 – 2012-05-02 (×2): 60 mg via INTRAVENOUS
  Filled 2012-05-01 (×6): qty 0.96

## 2012-05-01 MED ORDER — CEFUROXIME AXETIL 500 MG PO TABS
500.0000 mg | ORAL_TABLET | Freq: Two times a day (BID) | ORAL | Status: DC
  Administered 2012-05-02: 500 mg via ORAL
  Filled 2012-05-01 (×3): qty 1

## 2012-05-01 MED ORDER — POTASSIUM CHLORIDE CRYS ER 20 MEQ PO TBCR
40.0000 meq | EXTENDED_RELEASE_TABLET | Freq: Once | ORAL | Status: AC
  Administered 2012-05-01: 40 meq via ORAL
  Filled 2012-05-01: qty 2

## 2012-05-01 MED ORDER — ALBUTEROL SULFATE (5 MG/ML) 0.5% IN NEBU
2.5000 mg | INHALATION_SOLUTION | Freq: Four times a day (QID) | RESPIRATORY_TRACT | Status: DC
  Administered 2012-05-01 – 2012-05-02 (×4): 2.5 mg via RESPIRATORY_TRACT
  Filled 2012-05-01 (×4): qty 0.5

## 2012-05-01 NOTE — Evaluation (Signed)
Occupational Therapy Evaluation Patient Details Name: [name] MRN: [id number] DOB: 05-Dec-1930 Today's Date: 05/01/2012 Time: 6962-9528 OT Time Calculation (min): 17 min  OT Assessment / Plan / Recommendation Clinical Impression  Pt admitted due to nausea and dizziness/orthostatic and displays some decreased strength and independence with ADL. Currently she is able to perform her ADL at a min assist level overall. If ALF can help her initially, feel she can d/c back there.     OT Assessment  Patient needs continued OT Services    Follow Up Recommendations  Supervision/Assistance - 24 hour;Home health OT    Barriers to Discharge      Equipment Recommendations  None recommended by OT    Recommendations for Other Services    Frequency  Min 2X/week    Precautions / Restrictions Precautions Precautions: Fall Restrictions Weight Bearing Restrictions: No        ADL  Eating/Feeding: Performed;Set up Where Assessed - Eating/Feeding: Chair Grooming: Performed;Wash/dry hands;Set up Where Assessed - Grooming: Supported sitting Upper Body Bathing: Simulated;Chest;Right arm;Left arm;Abdomen;Supervision/safety;Set up Where Assessed - Upper Body Bathing: Unsupported sitting Lower Body Bathing: Simulated;Minimal assistance Where Assessed - Lower Body Bathing: Supported sit to stand Upper Body Dressing: Simulated;Set up Where Assessed - Upper Body Dressing: Unsupported sitting Lower Body Dressing: Simulated;Minimal assistance Where Assessed - Lower Body Dressing: Supported sit to stand Toilet Transfer: Performed;Minimal assistance Toilet Transfer Method: Other (comment) (with walker into bathroom) Toilet Transfer Equipment: Raised toilet seat with arms (or 3-in-1 over toilet) Toileting - Clothing Manipulation and Hygiene: Simulated;Minimal assistance Where Assessed - Engineer, mining and Hygiene: Sit to stand from 3-in-1 or toilet Equipment Used: Rolling walker ADL  Comments: Pt requires assist to manuever RW safely into bathroom and around obstacles.She tends to let go of walker prematurely. She doesnt talk much during session but is pleasant.     OT Diagnosis: Generalized weakness  OT Problem List: Decreased strength OT Treatment Interventions: Self-care/ADL training;DME and/or AE instruction;Patient/family education;Therapeutic activities   OT Goals Acute Rehab OT Goals OT Goal Formulation: With patient Time For Goal Achievement: 05/15/12 Potential to Achieve Goals: Good ADL Goals Pt Will Perform Grooming: with supervision;Standing at sink ADL Goal: Grooming - Progress: Goal set today Pt Will Transfer to Toilet: with supervision;Comfort height toilet;Grab bars;Ambulation ADL Goal: Toilet Transfer - Progress: Goal set today Pt Will Perform Toileting - Clothing Manipulation: with supervision;Standing ADL Goal: Toileting - Clothing Manipulation - Progress: Goal set today Additional ADL Goal #1: pt will gather bathing/dressing items and perform at supervision level.  ADL Goal: Additional Goal #1 - Progress: Goal set today  Visit Information  Last OT Received On: 05/01/12 Assistance Needed: +1 PT/OT Co-Evaluation/Treatment: Yes    Subjective Data  Subjective: pt smiles and acknowledges therapists. Patient Stated Goal: none stated   Prior Functioning     Home Living Available Help at Discharge: Other (Comment) (Assisted Living) Bathroom Shower/Tub: Walk-in shower Home Adaptive Equipment: Built-in shower seat Prior Function Level of Independence: Needs assistance Needs Assistance: Meal Prep;Light Housekeeping Meal Prep: Total Light Housekeeping: Total Communication Communication: No difficulties Dominant Hand: Right         Vision/Perception     Cognition  Cognition Arousal/Alertness: Awake/alert (initially asleep but able to arouse for therapy) Behavior During Therapy: WFL for tasks assessed/performed Overall Cognitive  Status: No family/caregiver present to determine baseline cognitive functioning (able to state hospital but later in session said AL )    Extremity/Trunk Assessment Right Upper Extremity Assessment RUE ROM/Strength/Tone: [facility] for tasks assessed  Left Upper Extremity Assessment LUE ROM/Strength/Tone: WFL for tasks assessed     Mobility Bed Mobility Bed Mobility: Supine to Sit Supine to Sit: 5: Supervision;HOB elevated Transfers Transfers: Sit to Stand;Stand to Sit Sit to Stand: 4: Min assist;With upper extremity assist;From bed;From chair/3-in-1 Stand to Sit: 4: Min assist;With upper extremity assist;To chair/3-in-1 Details for Transfer Assistance: verbal cues for hand placement and safety     Exercise     Balance     End of Session OT - End of Session Activity Tolerance: Patient tolerated treatment well Patient left: in chair;with call bell/phone within reach;with chair alarm set  GO     Lennox Laity [phone] 05/01/2012, 11:40 AM

## 2012-05-01 NOTE — Progress Notes (Signed)
[facility] PROGRESS NOTE  [name] WUJ:[id number] DOB: 09-01-1930 DOA: 04/29/2012 PCP: Oliver Barre, MD  Assessment/Plan: Asthma exacerbation  - on IV steroids, will transitioned to po prednisone 4/29 -Still has significant wheezing--> change to IV steroids - still with diffuse wheezing on exam -Make albuterol scheduled - change Cefuroxime to po - will walk to PT/OT to see sats on ambulation  Dehydration/orthostatic hypotension -Continue IV fluids Hypokalemia -Replete -Check magnesium HLD - pravastatin  GERD - protonix  CAD - Aspirin 81 mg daily  Dementia - continue Memantine, Aricept  DVT Prophylaxis - Lovenox Dysphagia- -evaluated by speech therapy who recommended regular diet with thin liquids with strict reflux precautions     Family Communication:   Pt at beside Disposition Plan:   Back to SNF when medically stable    Antibiotics:  Cefuroxime 04/29/2012>>>      Procedures/Studies: Dg Chest 2 View  04/29/2012  *RADIOLOGY REPORT*  Clinical Data: Cough and wheezing.  CHEST - 2 VIEW  Comparison: 08/02/2009  Findings: Two views of the chest demonstrate median sternotomy wires.  Stable appearance of the heart and mediastinum.  Stable calcifications along the anterior lower ribs.  The thoracic aorta is heavily calcified.  Lungs are clear without airspace disease or edema.  IMPRESSION: No acute cardiopulmonary disease.   Original Report Authenticated By: Richarda Overlie, M.D.    Dg Swallowing Func-speech Pathology  04/30/2012  Chales Abrahams, CCC-SLP     04/30/2012  3:32 PM Objective Swallowing Evaluation: Modified Barium Swallowing Study   Patient Details  Name: [name] MRN: [id number] Date of Birth: 10/29/1930  Today's Date: 04/30/2012 Time: 5784-6962 SLP Time Calculation (min): 20 min  Past Medical History:  Past Medical History  Diagnosis Date  . HYPERLIPIDEMIA 12/20/2006  . Senile dementia, uncomplicated 01/06/2009  . INSOMNIA-SLEEP DISORDER-UNSPEC 11/09/2006  .  BLURRED VISION 06/29/2008  . HYPERTENSION 11/09/2006  . CORONARY ARTERY DISEASE 07/24/2006  . ASTHMATIC BRONCHITIS, ACUTE 01/14/2007  . ALLERGIC RHINITIS 11/09/2006  . GERD 06/04/2006  . CONSTIPATION 03/01/2009  . BACK PAIN 03/01/2009  . OSTEOPOROSIS 10/13/2009  . FATIGUE 01/21/2008  . CHEST PAIN 09/12/2007  . CEREBROVASCULAR ACCIDENT, HX OF 07/24/2006  . Goiter, unspecified 02/01/2010  . Cough 02/01/2010  . Asthma   . Chronic cough    Past Surgical History:  Past Surgical History  Procedure Laterality Date  . Dilation and curettage of uterus     HPI:  Pt is a 79 yo female adm to [facility] with chronic cough, ? asthma  varient?  CXR was negative for acute pulmonary disease.  Pt's PMH  + for CVA, dementia, CAD, insomnia, GERD (on PPI).  Diet at SNF  is NAS/regular.  Md ordered MBS due to concerns pt may have  chronic aspiration.  Pt denies problems coughing or choking with  intake but did admit to reflux symptoms when SLP inquired.       Assessment / Plan / Recommendation Clinical Impression  Dysphagia Diagnosis: Mild oral phase dysphagia;Suspected primary  esophageal dysphagia  Clinical impression: Pt presents with mild oral deficits  consistent with findings of dysphagia and dementia.  Mild delay  in oral transit with piecemeal deglutition noted.  Pharyngeal  swallow was strong with only minimal stasis.  Therapeutic  intervention included educating pt to strategies to decrease  aspiration risk based on MBS today including conducting  intermittent dry swallow.  Pt able to swallow barium tablet that  cleared through pharynx and esophagus without delay.    Primary aspiration  risk likley due to known GERD, please see  esophageal phase statement of this test.   Pt also admits to  reflux symptoms, she may benefit from management to decrease  symptoms.    Rec pt have regular/thin diet with strict reflux precautions.   SLP to follow briefly for family education.      Treatment Recommendation    TBD   Diet Recommendation Regular;Thin liquid  (several small meals as  able)   Liquid Administration via: Cup;Straw Medication Administration: Whole meds with liquid Supervision: Patient able to self feed;Intermittent supervision  to cue for compensatory strategies Compensations: Slow rate;Small sips/bites (intermittent dry  swallow) Postural Changes and/or Swallow Maneuvers: Seated upright 90  degrees;Upright 30-60 min after meal    Other  Recommendations Oral Care Recommendations: Oral care QID   Follow Up Recommendations    TBD   Frequency and Duration min 1 x/week  1 week   Pertinent Vitals/Pain Afebrile,decreased    SLP Swallow Goals Patient will utilize recommended strategies during swallow to  increase swallowing safety with: Moderate cueing (given dementia  dx) Goal #3: Family will verbalize findings of MBS report and  strategies to mitigate asp risk with min assist.    General Date of Onset: 04/30/12 HPI: Pt is a 79 yo female adm to [facility] with chronic cough, ? asthma  varient?  CXR was negative for acute pulmonary disease.  Pt's PMH  + for CVA, dementia, CAD, insomnia, GERD (on PPI).  Diet at SNF  is NAS/regular.  Md ordered MBS due to concerns pt may have  chronic aspiration.  Pt denies problems coughing or choking with  intake but did admit to reflux symptoms when SLP inquired.   Type of Study: Modified Barium Swallowing Study Reason for Referral: Objectively evaluate swallowing function Diet Prior to this Study: Dysphagia 3 (soft);Thin liquids Temperature Spikes Noted: No Respiratory Status: Room air History of Recent Intubation: No Behavior/Cognition: Alert;Cooperative;Pleasant mood;Requires  cueing (pt has dementia) Oral Cavity - Dentition: Dentures, bottom;Dentures, top Oral Motor / Sensory Function:  (lingual deviation to left upon  protrusion) Self-Feeding Abilities: Able to feed self;Needs set up Patient Positioning: Upright in chair Baseline Vocal Quality: Clear Volitional Cough: Strong Volitional Swallow: Able to elicit Anatomy: Within  functional limits Pharyngeal Secretions: Not observed secondary MBS    Reason for Referral Objectively evaluate swallowing function   Oral Phase Oral Preparation/Oral Phase Oral Phase: Impaired Oral - Nectar Oral - Nectar Cup: Delayed oral transit;Piecemeal swallowing Oral - Thin Oral - Thin Cup: Delayed oral transit;Piecemeal swallowing Oral - Thin Straw: Delayed oral transit;Piecemeal swallowing Oral - Solids Oral - Puree: Delayed oral transit;Piecemeal swallowing Oral - Regular: Delayed oral transit;Piecemeal swallowing Oral - Pill: Delayed oral transit   Pharyngeal Phase Pharyngeal Phase Pharyngeal Phase: Within functional limits  Cervical Esophageal Phase    GO    Cervical Esophageal Phase Cervical Esophageal Phase: Impaired Cervical Esophageal Phase - Comment Cervical Esophageal Comment: Appearance of minimal amount of  stasis at distal esophagus with liquids and ? tertiary  contractions with minimal backflow, pt did not sense stasis,   cracker and pudding appeared to readily clear,  findings may be  consistent with dysmotility, radiologist not present to confirm         Donavan Burnet, MS [facility] SLP [phone]           Subjective:  patient is sleepy but she arouses easily. She denies any headache, chest pain, shortness breath, vomiting, diarrhea, abdominal pain, dysuria.  Objective: Filed Vitals:  04/30/12 1928 04/30/12 2003 05/01/12 0625 05/01/12 1403  BP:  98/64 102/78 124/58  Pulse:  100 71 57  Temp:  97.4 F (36.3 C) 98.4 F (36.9 C) 98.8 F (37.1 C)  TempSrc:  Oral Oral Oral  Resp:  20 18 18   Height:      Weight:      SpO2: 93% 96% 93% 93%    Intake/Output Summary (Last 24 hours) at 05/01/12 1726 Last data filed at 05/01/12 1053  Gross per 24 hour  Intake   2060 ml  Output      2 ml  Net   2058 ml   Weight change:  Exam:   General:  Pt is alert, follows commands appropriately, not in acute distress  HEENT: No icterus, No thrush, [place]/AT  Cardiovascular: RRR, S1/S2, no  rubs, no gallops  Respiratory: bilateral expiratory wheeze. Good air movement. No rhonchi.   Abdomen: Soft/+BS, non tender, non distended, no guarding  Extremities: No edema, No lymphangitis, No petechiae, No rashes, no synovitis  Data Reviewed: Basic Metabolic Panel:  Recent Labs Lab 04/29/12 1617 05/01/12 0510  NA 138 144  K 3.4* 3.1*  CL 100 110  CO2  --  26  GLUCOSE 99 159*  BUN 19 8  CREATININE 0.80 0.55  CALCIUM  --  8.8   Liver Function Tests: No results found for this basename: AST, ALT, ALKPHOS, BILITOT, PROT, ALBUMIN,  in the last 168 hours No results found for this basename: LIPASE, AMYLASE,  in the last 168 hours No results found for this basename: AMMONIA,  in the last 168 hours CBC:  Recent Labs Lab 04/29/12 1531 04/29/12 1617 05/01/12 0510  WBC 4.0  --  5.7  NEUTROABS 2.0  --   --   HGB 15.7* 16.3* 12.5  HCT 46.9* 48.0* 37.9  MCV 90.2  --  89.2  PLT 155  --  128*   Cardiac Enzymes: No results found for this basename: CKTOTAL, CKMB, CKMBINDEX, TROPONINI,  in the last 168 hours BNP: No components found with this basename: POCBNP,  CBG: No results found for this basename: GLUCAP,  in the last 168 hours  No results found for this or any previous visit (from the past 240 hour(s)).   Scheduled Meds: . albuterol  2.5 mg Nebulization Q6H  . aspirin  81 mg Oral q morning - 10a  . calcium-vitamin D  1 tablet Oral q1800  . cefUROXime (ZINACEF)  IV  750 mg Intravenous Q8H  . docusate sodium  100 mg Oral BID  . donepezil  10 mg Oral QHS  . enoxaparin (LOVENOX) injection  40 mg Subcutaneous Q24H  . Memantine HCl ER  1 capsule Oral q morning - 10a  . methylPREDNISolone (SOLU-MEDROL) injection  60 mg Intravenous Q8H  . miconazole  1 application Topical TID  . mometasone-formoterol  2 puff Inhalation BID  . nystatin  1 g Topical q morning - 10a  . pantoprazole  80 mg Oral Daily  . potassium chloride  40 mEq Oral Once  . pravastatin  40 mg Oral QPC  supper  . senna-docusate  2 tablet Oral QHS  . sodium chloride  3 mL Intravenous Q12H   Continuous Infusions: . dextrose 5 % and 0.9% NaCl 75 mL/hr at 05/01/12 0943     Jessica Rigsby, DO  [facility] Pager [phone]  If 7PM-7AM, please contact night-coverage [email or web address] Password [email or web address] 05/01/2012, 5:26 PM   LOS: 2 days

## 2012-05-01 NOTE — Evaluation (Addendum)
Physical Therapy Evaluation Patient Details Name: [name] MRN: [id number] DOB: 27-Nov-1930 Today's Date: 05/01/2012 Time: 7829-5621 PT Time Calculation (min): 17 min  PT Assessment / Plan / Recommendation Clinical Impression  Pt presents with senile dementia, light headedness, and chronic cough.  Note that she is from [facility] and prior to getting sick was indpenedent with all activities.  She states that she was not using AD with ambulation and was able to get herself bathed and dressed.  Today, she requires min assist and cues for proper use of RW.  Pt will benefit from skilled PT in acute venue to address deficits.  PT recommends HHPT for follow up at ALF if facility able to provide min assist initally until pt back at baseline.     PT Assessment  Patient needs continued PT services    Follow Up Recommendations  Home health PT;Supervision/Assistance - 24 hour    Does the patient have the potential to tolerate intense rehabilitation      Barriers to Discharge None per notes, facility was providing min assist.     Equipment Recommendations   (TBD)    Recommendations for Other Services     Frequency Min 3X/week    Precautions / Restrictions Precautions Precautions: Fall Restrictions Weight Bearing Restrictions: No   Pertinent Vitals/Pain No pain      Mobility  Bed Mobility Bed Mobility: Supine to Sit Supine to Sit: 5: Supervision;HOB elevated Details for Bed Mobility Assistance: Supervision for safety with mod cues for encouragement to complete task.  Transfers Transfers: Sit to Stand;Stand to Sit Sit to Stand: 4: Min assist;With upper extremity assist;From bed;From chair/3-in-1 Stand to Sit: 4: Min assist;With upper extremity assist;To chair/3-in-1 Details for Transfer Assistance: verbal cues for hand placement and safety Ambulation/Gait Ambulation/Gait Assistance: 4: Min assist Ambulation Distance (Feet): 15 Feet (x2) Assistive device: Rolling  walker Ambulation/Gait Assistance Details: Cues for sequencing/technique with RW, esp in restroom as she had difficulty processing correct use of RW.   Gait Pattern: Step-through pattern;Decreased stride length;Trunk flexed Gait velocity: decreased Stairs: No Wheelchair Mobility Wheelchair Mobility: No    Exercises     PT Diagnosis: Difficulty walking;Generalized weakness  PT Problem List: Decreased strength;Decreased activity tolerance;Decreased balance;Decreased mobility;Decreased coordination;Decreased cognition;Decreased knowledge of use of DME;Decreased safety awareness;Decreased knowledge of precautions PT Treatment Interventions: DME instruction;Gait training;Functional mobility training;Therapeutic activities;Therapeutic exercise;Balance training;Patient/family education   PT Goals Acute Rehab PT Goals PT Goal Formulation: With patient Time For Goal Achievement: 05/08/12 Potential to Achieve Goals: Good Pt will go Supine/Side to Sit: with supervision PT Goal: Supine/Side to Sit - Progress: Goal set today Pt will go Sit to Stand: with supervision PT Goal: Sit to Stand - Progress: Goal set today Pt will go Stand to Sit: with supervision PT Goal: Stand to Sit - Progress: Goal set today Pt will Ambulate: 51 - 150 feet;with supervision;with least restrictive assistive device PT Goal: Ambulate - Progress: Goal set today  Visit Information  Last PT Received On: 05/01/12 Assistance Needed: +1    Subjective Data  Subjective: Where am I? Patient Stated Goal: n/a   Prior Functioning  Home Living Available Help at Discharge: Other (Comment) (Assisted Living) Type of Home: Assisted living Home Access: Level entry Bathroom Shower/Tub: Walk-in shower Home Adaptive Equipment: Built-in shower seat Prior Function Level of Independence: Needs assistance Needs Assistance: Meal Prep;Light Housekeeping Meal Prep: Total Light Housekeeping: Total Communication Communication: No  difficulties Dominant Hand: Right    Cognition  Cognition Arousal/Alertness: Awake/alert (initially asleep  but able to arouse for therapy) Behavior During Therapy: [facility] for tasks assessed/performed Overall Cognitive Status: No family/caregiver present to determine baseline cognitive functioning (able to state hospital but later in session said AL )    Extremity/Trunk Assessment Right Upper Extremity Assessment RUE ROM/Strength/Tone: [facility] for tasks assessed Left Upper Extremity Assessment LUE ROM/Strength/Tone: [facility] for tasks assessed Right Lower Extremity Assessment RLE ROM/Strength/Tone: [facility] for tasks assessed Left Lower Extremity Assessment LLE ROM/Strength/Tone: [facility] for tasks assessed   Balance    End of Session PT - End of Session Activity Tolerance: Patient limited by fatigue Patient left: in chair;with call bell/phone within reach;with chair alarm set Nurse Communication: Mobility status  GP     Vista Deck 05/01/2012, 11:55 AM

## 2012-05-01 NOTE — Progress Notes (Addendum)
Speech Language Pathology Dysphagia Treatment Patient Details Name: MATTYE VERDONE MRN: [id number] DOB: 01/24/1930 Today's Date: 05/01/2012 Time: 1101-1120 SLP Time Calculation (min): 19 min  Assessment / Plan / Recommendation Clinical Impression  Pt seen for assessment of diet tolerance after MBS yesterday and to educate pt/family to findings/compensation strategies/precautions.   Pt was observed consuming graham cracker, applesauce and water without s/s of aspiration and timely swallow.  Approximately five minutes later, SLP heard pt coughing in room.    Upon arrival to room, pt reports she was drinking liquids.  Suspect pt's primary deficits and aspiration are esophageal/motility/reflux related.  SLP left compensatory strategies posted on wall, but due to pt's dementia suspect she will not be able to follow them.    Informed RN that family is available to call if desire information re: testing.      Diet Recommendation  Initiate / Change Diet: Regular;Thin liquid (gravy/sauce extra)    SLP Plan All goals met   Pertinent Vitals/Pain Afebrile, decreased   Swallowing Goals  SLP Swallowing Goals Swallow Study Goal #2 - Progress: Met Goal #3:  (informed RN that family may call if desire)   Oral Cavity - Oral Hygiene   oral cavity clear  Dysphagia Treatment Treatment focused on: Skilled observation of diet tolerance;Patient/family/caregiver Dealer Educated: pt only, no family present Treatment Methods/Modalities: Skilled observation Patient observed directly with PO's: Yes Type of PO's observed: Regular;Dysphagia 1 (puree);Thin liquids Feeding: Able to feed self Liquids provided via: Cup Pharyngeal Phase Signs & Symptoms: Delayed cough (delayed cough at completion of "snack" after SLP left room) Type of cueing: Verbal Amount of cueing: Moderate   GO     Donavan Burnet, MS [facility] SLP [phone]

## 2012-05-01 NOTE — Progress Notes (Signed)
[facility] called d/t pt c/o cough. [facility] placed orders.

## 2012-05-02 DIAGNOSIS — F519 Sleep disorder not due to a substance or known physiological condition, unspecified: Secondary | ICD-10-CM

## 2012-05-02 DIAGNOSIS — J45901 Unspecified asthma with (acute) exacerbation: Principal | ICD-10-CM

## 2012-05-02 LAB — BASIC METABOLIC PANEL
BUN: 10 mg/dL (ref 6–23)
Calcium: 8.8 mg/dL (ref 8.4–10.5)
GFR calc non Af Amer: 90 mL/min — ABNORMAL LOW (ref >90)
Glucose, Bld: 151 mg/dL — ABNORMAL HIGH (ref 70–99)
Sodium: 143 mEq/L (ref 135–145)

## 2012-05-02 MED ORDER — MOMETASONE FURO-FORMOTEROL FUM 100-5 MCG/ACT IN AERO: 2.0000 | INHALATION_SPRAY | Freq: Two times a day (BID) | RESPIRATORY_TRACT | Status: DC

## 2012-05-02 MED ORDER — PREDNISONE 50 MG PO TABS
60.0000 mg | ORAL_TABLET | Freq: Every day | ORAL | Status: DC
  Administered 2012-05-02: 60 mg via ORAL
  Filled 2012-05-02 (×2): qty 1

## 2012-05-02 MED ORDER — PREDNISONE 20 MG PO TABS: ORAL_TABLET | ORAL | Status: DC

## 2012-05-02 MED ORDER — CEFUROXIME AXETIL 500 MG PO TABS: 500.0000 mg | ORAL_TABLET | Freq: Two times a day (BID) | ORAL | Status: DC

## 2012-05-02 NOTE — Progress Notes (Addendum)
Clinical Social Work  CSW faxed DC summary and FL2 to [facility]. ALF reports they evaluated patient on Tuesday 04/30/12 and that patient seemed appropriate to return. ALF reports that they will come and evaluate patient again this afternoon at 4:30pm. CSW informed ALF that son will transport patient back to ALF at 5:00pm. CSW spoke with son who is agreeable to time and will take chart copy to ALF at dc. RN aware of plans as well. CSW is signing off but available if further needs arise.  [place], [place] [phone]  1610-Cheryl from ALF called and reported she will not come evaluate patient at the hospital and patient can return when son arrives to transport back to facility.

## 2012-05-02 NOTE — Progress Notes (Signed)
Clinical Social Work  CSW faxed DC summary and FL2 to ALF. CSW called to confirm information was received. ALF to review information and call CSW back. CSW will continue to follow.  [place], [place] [phone]  12:40-CSW called ALF who reports that they have not had time to review information. CSW explained that patient has been DC and CSW needs a response regarding patient returning. RN agreeable to information admissions coordinator of information. CSW will continue to follow.

## 2012-05-02 NOTE — Progress Notes (Signed)
Physical Therapy Treatment Patient Details Name: JOELYS STAUBS MRN: [id number] DOB: 04-26-1930 Today's Date: 05/02/2012 Time: 9147-8295 PT Time Calculation (min): 19 min  PT Assessment / Plan / Recommendation Comments on Treatment Session  Pt demon decreased cognition/problem solving and requires min assist.  Not sure if this is her base line.  Awaiting ALF to determin if they can provide min assist with transfers and amb.  If not then SNF.    Follow Up Recommendations  Home health PT;Supervision/Assistance - 24 hour     Does the patient have the potential to tolerate intense rehabilitation     Barriers to Discharge        Equipment Recommendations       Recommendations for Other Services    Frequency Min 3X/week   Plan      Precautions / Restrictions Restrictions Weight Bearing Restrictions: No   Pertinent Vitals/Pain No c/o pain    Mobility  Bed Mobility Bed Mobility: Supine to Sit;Sit to Supine Supine to Sit: 5: Supervision Details for Bed Mobility Assistance: supervision with repeat function cues to stay on task Transfers Transfers: Sit to Stand;Stand to Sit Sit to Stand: 4: Min assist;With upper extremity assist;From bed Stand to Sit: 4: Min assist;With upper extremity assist Details for Transfer Assistance: 75% VC's on proper tech and hand placement as pt attempted to pull self up using RW Ambulation/Gait Ambulation/Gait Assistance Details: Unable to attempt amb 2nd c/o feeling dizzy and pt quickly sat back down.  Pt stated she just got back to bed from bathroom.     PT Goals    Visit Information  Last PT Received On: 05/02/12    Subjective Data      Cognition       Balance   poor  End of Session PT - End of Session Equipment Utilized During Treatment: Gait belt Activity Tolerance: Patient limited by fatigue Patient left: in bed;with call bell/phone within reach   Felecia Shelling  PTA [facility]  Acute  Rehab Pager      [phone]

## 2012-05-02 NOTE — Discharge Summary (Signed)
Physician Discharge Summary  [name] ZOX:[id number] DOB: 07/28/1930 DOA: 04/29/2012  PCP: Oliver Barre, MD  Admit date: 04/29/2012 Discharge date: 05/02/2012  Recommendations for Outpatient Follow-up:  1. Pt will need to follow up with PCP in 2 weeks post discharge 2. Please obtain BMP to evaluate electrolytes and kidney function 3. Please also check CBC to evaluate Hg and Hct levels   Discharge Diagnoses:  Active Problems:   HYPERLIPIDEMIA   Senile dementia, uncomplicated   INSOMNIA-SLEEP DISORDER-UNSPEC   HYPERTENSION   Cough variant asthma   ASTHMA   GERD   Dehydration, moderate   Asthma with acute exacerbation Asthma exacerbation  -Patient was started on intravenous steroids which was initially weaned to oral prednisone 20 mg twice a day on 04/30/12 -The patient continued to have wheezing -Prednisone was changed back to IV Solu-Medrol -The patient's wheezing significantly improved -ambulatory pulse oximetry on room did not show Desaturation--her oxygen saturation was 95% after her walk - still with diffuse wheezing on exam  -Make albuterol scheduled  -The patient was initially placed on intravenous cefuroxime - change Cefuroxime to po--tolerated without difficulty--discharge with 2 additional days for 5 days total -On the day of discharge, the patient was completely awake and alert and interactive -She will also be discharged on Duleral twice a day -She will be on a prednisone wean 60 mg daily x2 days, 40 mg daily x2 days, 20 mg daily x2 days Dehydration/orthostatic hypotension  -Patient was given intravenous fluids -The patient's oral intake improved when she was more alert The patient remained hemodynamically stable.-  Hypokalemia  -Repleted -Check magnesium--2.2  HLD - pravastatin  GERD - protonix  CAD - Aspirin 81 mg daily  Dementia - continue Memantine, Aricept  DVT Prophylaxis - Lovenox  Dysphagia-  -evaluated by speech therapy who recommended regular diet with  thin liquids with strict reflux precautions   Discharge Condition: Stable  Disposition:  discharge to assisted living  Diet: Heart healthy Wt Readings from Last 3 Encounters:  04/30/12 68.7 kg (151 lb 7.3 oz)  10/10/10 67.302 kg (148 lb 6 oz)  08/19/10 67.132 kg (148 lb)    History of present illness:  79 y.o. female with hx of dementia, NH resident, hx of chronic cough felt to be "asthma variant", HTN, CAD, GERD, Goiter, prior CVA, brought into the ER because she was having nausea and not able to take adequate oral intake. She has these "coughing fits" which are at times incessant. Son had told me that she had tests done, but no cause was found. She has some shortness of breath and no chest pain. There has been no diarrhea, abdominal pain, fever or chills. Evaluation in the ER showed normal chemistry, no leukocytosis, and clear CXR. She was asked to ambulate, but when she stood up, she became lightheaded and has orthostatic BP changes. She was given IV steroids and neb for her wheezing, some IVF and hospitalist was asked to admit her for dehydration and asthmatic exacerbation.      Discharge Exam: Filed Vitals:   05/02/12 0951  BP:   Pulse: 99  Temp:   Resp:    Filed Vitals:   05/02/12 0518 05/02/12 0600 05/02/12 0832 05/02/12 0951  BP: 139/72 138/74    Pulse: 62 75  99  Temp: 97.7 F (36.5 C) 98.6 F (37 C)    TempSrc: Oral Oral    Resp: 20 20    Height:      Weight:      SpO2:  95% 96% 93% 95%   General: A&O x 2, NAD, pleasant, cooperative Cardiovascular: RRR, no rub, no gallop, no S3 Respiratory: CTAB, no wheeze, no rhonchi Abdomen:soft, nontender, nondistended, positive bowel sounds Extremities: No edema, No lymphangitis, no petechiae  Discharge Instructions      Discharge Orders   Future Orders Complete By Expires     Diet - low sodium heart healthy  As directed     Discharge instructions  As directed     Comments:      Prednisone 60 mg daily x2 days, then  40 mg x2 days, then 20 mg x2 days    Increase activity slowly  As directed         Medication List    STOP taking these medications       Fluticasone-Salmeterol 250-50 MCG/DOSE Aepb  Commonly known as:  ADVAIR  Replaced by:  mometasone-formoterol 100-5 MCG/ACT Aero     levofloxacin 500 MG tablet  Commonly known as:  LEVAQUIN      TAKE these medications       alendronate 70 MG tablet  Commonly known as:  FOSAMAX  Take 70 mg by mouth every 7 (seven) days. Take with a full glass of water on an empty stomach.     aspirin 81 MG chewable tablet  Chew 81 mg by mouth every morning.     BAZA ANTIFUNGAL 2 % cream  Generic drug:  miconazole  Apply 1 application topically 3 (three) times daily.     calcium-vitamin D 500-200 MG-UNIT per tablet  Commonly known as:  OSCAL WITH D  Take 1 tablet by mouth daily at 6 PM.     cefUROXime 500 MG tablet  Commonly known as:  CEFTIN  Take 1 tablet (500 mg total) by mouth 2 (two) times daily with a meal.     donepezil 10 MG tablet  Commonly known as:  ARICEPT  Take 10 mg by mouth at bedtime.     mometasone-formoterol 100-5 MCG/ACT Aero  Commonly known as:  DULERA  Inhale 2 puffs into the lungs 2 (two) times daily.     NAMENDA XR 14 MG Cp24  Generic drug:  Memantine HCl ER  Take 1 tablet by mouth every morning.     nystatin 100000 UNIT/GM Powd  Apply 1 g topically every morning. Under breasts and folds of stomach     omeprazole 20 MG capsule  Commonly known as:  PRILOSEC  Take 20 mg by mouth every morning.     pravastatin 40 MG tablet  Commonly known as:  PRAVACHOL  Take 40 mg by mouth every evening.     predniSONE 20 MG tablet  Commonly known as:  DELTASONE  Take 60mg  (3 tabs) x 2 days, then 40mg  (2 tabs) x 2 days, then 20mg  (1tab) x 2 days     senna-docusate 8.6-50 MG per tablet  Commonly known as:  Senokot-S  Take 2 tablets by mouth at bedtime.         The results of significant diagnostics from this hospitalization  (including imaging, microbiology, ancillary and laboratory) are listed below for reference.    Significant Diagnostic Studies: Dg Chest 2 View  04/29/2012  *RADIOLOGY REPORT*  Clinical Data: Cough and wheezing.  CHEST - 2 VIEW  Comparison: 08/02/2009  Findings: Two views of the chest demonstrate median sternotomy wires.  Stable appearance of the heart and mediastinum.  Stable calcifications along the anterior lower ribs.  The thoracic aorta is heavily calcified.  Lungs  are clear without airspace disease or edema.  IMPRESSION: No acute cardiopulmonary disease.   Original Report Authenticated By: Richarda Overlie, M.D.    Dg Swallowing Func-speech Pathology  04/30/2012  Chales Abrahams, CCC-SLP     04/30/2012  3:32 PM Objective Swallowing Evaluation: Modified Barium Swallowing Study   Patient Details  Name: STEPHANYE FINNICUM MRN: [id number] Date of Birth: [birth year]  Today's Date: 04/30/2012 Time: 6295-2841 SLP Time Calculation (min): 20 min  Past Medical History:  Past Medical History  Diagnosis Date  . HYPERLIPIDEMIA 12/20/2006  . Senile dementia, uncomplicated 01/06/2009  . INSOMNIA-SLEEP DISORDER-UNSPEC 11/09/2006  . BLURRED VISION 06/29/2008  . HYPERTENSION 11/09/2006  . CORONARY ARTERY DISEASE 07/24/2006  . ASTHMATIC BRONCHITIS, ACUTE 01/14/2007  . ALLERGIC RHINITIS 11/09/2006  . GERD 06/04/2006  . CONSTIPATION 03/01/2009  . BACK PAIN 03/01/2009  . OSTEOPOROSIS 10/13/2009  . FATIGUE 01/21/2008  . CHEST PAIN 09/12/2007  . CEREBROVASCULAR ACCIDENT, HX OF 07/24/2006  . Goiter, unspecified 02/01/2010  . Cough 02/01/2010  . Asthma   . Chronic cough    Past Surgical History:  Past Surgical History  Procedure Laterality Date  . Dilation and curettage of uterus     HPI:  Pt is a 79 yo female adm to [facility] with chronic cough, ? asthma  varient?  CXR was negative for acute pulmonary disease.  Pt's PMH  + for CVA, dementia, CAD, insomnia, GERD (on PPI).  Diet at SNF  is NAS/regular.  Md ordered MBS due to concerns pt may have  chronic aspiration.  Pt  denies problems coughing or choking with  intake but did admit to reflux symptoms when SLP inquired.       Assessment / Plan / Recommendation Clinical Impression  Dysphagia Diagnosis: Mild oral phase dysphagia;Suspected primary  esophageal dysphagia  Clinical impression: Pt presents with mild oral deficits  consistent with findings of dysphagia and dementia.  Mild delay  in oral transit with piecemeal deglutition noted.  Pharyngeal  swallow was strong with only minimal stasis.  Therapeutic  intervention included educating pt to strategies to decrease  aspiration risk based on MBS today including conducting  intermittent dry swallow.  Pt able to swallow barium tablet that  cleared through pharynx and esophagus without delay.    Primary aspiration risk likley due to known GERD, please see  esophageal phase statement of this test.   Pt also admits to  reflux symptoms, she may benefit from management to decrease  symptoms.    Rec pt have regular/thin diet with strict reflux precautions.   SLP to follow briefly for family education.      Treatment Recommendation    TBD   Diet Recommendation Regular;Thin liquid (several small meals as  able)   Liquid Administration via: Cup;Straw Medication Administration: Whole meds with liquid Supervision: Patient able to self feed;Intermittent supervision  to cue for compensatory strategies Compensations: Slow rate;Small sips/bites (intermittent dry  swallow) Postural Changes and/or Swallow Maneuvers: Seated upright 90  degrees;Upright 30-60 min after meal    Other  Recommendations Oral Care Recommendations: Oral care QID   Follow Up Recommendations    TBD   Frequency and Duration min 1 x/week  1 week   Pertinent Vitals/Pain Afebrile,decreased    SLP Swallow Goals Patient will utilize recommended strategies during swallow to  increase swallowing safety with: Moderate cueing (given dementia  dx) Goal #3: Family will verbalize findings of MBS report and  strategies to mitigate asp risk  with min assist.  General Date of Onset: 04/30/12 HPI: Pt is a 79 yo female adm to [facility] with chronic cough, ? asthma  varient?  CXR was negative for acute pulmonary disease.  Pt's PMH  + for CVA, dementia, CAD, insomnia, GERD (on PPI).  Diet at SNF  is NAS/regular.  Md ordered MBS due to concerns pt may have  chronic aspiration.  Pt denies problems coughing or choking with  intake but did admit to reflux symptoms when SLP inquired.   Type of Study: Modified Barium Swallowing Study Reason for Referral: Objectively evaluate swallowing function Diet Prior to this Study: Dysphagia 3 (soft);Thin liquids Temperature Spikes Noted: No Respiratory Status: Room air History of Recent Intubation: No Behavior/Cognition: Alert;Cooperative;Pleasant mood;Requires  cueing (pt has dementia) Oral Cavity - Dentition: Dentures, bottom;Dentures, top Oral Motor / Sensory Function:  (lingual deviation to left upon  protrusion) Self-Feeding Abilities: Able to feed self;Needs set up Patient Positioning: Upright in chair Baseline Vocal Quality: Clear Volitional Cough: Strong Volitional Swallow: Able to elicit Anatomy: Within functional limits Pharyngeal Secretions: Not observed secondary MBS    Reason for Referral Objectively evaluate swallowing function   Oral Phase Oral Preparation/Oral Phase Oral Phase: Impaired Oral - Nectar Oral - Nectar Cup: Delayed oral transit;Piecemeal swallowing Oral - Thin Oral - Thin Cup: Delayed oral transit;Piecemeal swallowing Oral - Thin Straw: Delayed oral transit;Piecemeal swallowing Oral - Solids Oral - Puree: Delayed oral transit;Piecemeal swallowing Oral - Regular: Delayed oral transit;Piecemeal swallowing Oral - Pill: Delayed oral transit   Pharyngeal Phase Pharyngeal Phase Pharyngeal Phase: Within functional limits  Cervical Esophageal Phase    GO    Cervical Esophageal Phase Cervical Esophageal Phase: Impaired Cervical Esophageal Phase - Comment Cervical Esophageal Comment: Appearance of minimal  amount of  stasis at distal esophagus with liquids and ? tertiary  contractions with minimal backflow, pt did not sense stasis,   cracker and pudding appeared to readily clear,  findings may be  consistent with dysmotility, radiologist not present to confirm         Donavan Burnet, MS [facility] SLP [phone]       Microbiology: No results found for this or any previous visit (from the past 240 hour(s)).   Labs: Basic Metabolic Panel:  Recent Labs Lab 04/29/12 1617 05/01/12 0510 05/02/12 0526  NA 138 144 143  K 3.4* 3.1* 3.6  CL 100 110 107  CO2  --  26 27  GLUCOSE 99 159* 151*  BUN 19 8 10   CREATININE 0.80 0.55 0.50  CALCIUM  --  8.8 8.8  MG  --   --  2.2   Liver Function Tests: No results found for this basename: AST, ALT, ALKPHOS, BILITOT, PROT, ALBUMIN,  in the last 168 hours No results found for this basename: LIPASE, AMYLASE,  in the last 168 hours No results found for this basename: AMMONIA,  in the last 168 hours CBC:  Recent Labs Lab 04/29/12 1531 04/29/12 1617 05/01/12 0510  WBC 4.0  --  5.7  NEUTROABS 2.0  --   --   HGB 15.7* 16.3* 12.5  HCT 46.9* 48.0* 37.9  MCV 90.2  --  89.2  PLT 155  --  128*   Cardiac Enzymes: No results found for this basename: CKTOTAL, CKMB, CKMBINDEX, TROPONINI,  in the last 168 hours BNP: No components found with this basename: POCBNP,  CBG: No results found for this basename: GLUCAP,  in the last 168 hours  Time coordinating discharge:  Greater than 30 minutes  Signed:  Azul Coffie, DO [facility] Pager: [phone] 05/02/2012, 11:05 AM

## 2013-02-14 ENCOUNTER — Emergency Department (HOSPITAL_COMMUNITY): Payer: Medicare Other

## 2013-02-14 ENCOUNTER — Emergency Department (HOSPITAL_COMMUNITY)
Admission: EM | Admit: 2013-02-14 | Discharge: 2013-02-14 | Disposition: A | Discharge status: Home / Self Care | Payer: Medicare Other | Attending: Emergency Medicine | Admitting: Emergency Medicine

## 2013-02-14 ENCOUNTER — Encounter (HOSPITAL_COMMUNITY): Payer: Self-pay | Admitting: Emergency Medicine

## 2013-02-14 DIAGNOSIS — K219 Gastro-esophageal reflux disease without esophagitis: Secondary | ICD-10-CM | POA: Insufficient documentation

## 2013-02-14 DIAGNOSIS — S52509A Unspecified fracture of the lower end of unspecified radius, initial encounter for closed fracture: Secondary | ICD-10-CM | POA: Insufficient documentation

## 2013-02-14 DIAGNOSIS — Y9210 Unspecified residential institution as the place of occurrence of the external cause: Secondary | ICD-10-CM | POA: Insufficient documentation

## 2013-02-14 DIAGNOSIS — Z79899 Other long term (current) drug therapy: Secondary | ICD-10-CM | POA: Insufficient documentation

## 2013-02-14 DIAGNOSIS — Z8673 Personal history of transient ischemic attack (TIA), and cerebral infarction without residual deficits: Secondary | ICD-10-CM | POA: Insufficient documentation

## 2013-02-14 DIAGNOSIS — R296 Repeated falls: Secondary | ICD-10-CM | POA: Insufficient documentation

## 2013-02-14 DIAGNOSIS — Y939 Activity, unspecified: Secondary | ICD-10-CM | POA: Insufficient documentation

## 2013-02-14 DIAGNOSIS — Z7982 Long term (current) use of aspirin: Secondary | ICD-10-CM | POA: Insufficient documentation

## 2013-02-14 DIAGNOSIS — S52609A Unspecified fracture of lower end of unspecified ulna, initial encounter for closed fracture: Secondary | ICD-10-CM

## 2013-02-14 DIAGNOSIS — J45909 Unspecified asthma, uncomplicated: Secondary | ICD-10-CM | POA: Insufficient documentation

## 2013-02-14 DIAGNOSIS — I2510 Atherosclerotic heart disease of native coronary artery without angina pectoris: Secondary | ICD-10-CM | POA: Insufficient documentation

## 2013-02-14 DIAGNOSIS — I10 Essential (primary) hypertension: Secondary | ICD-10-CM | POA: Insufficient documentation

## 2013-02-14 DIAGNOSIS — IMO0002 Reserved for concepts with insufficient information to code with codable children: Secondary | ICD-10-CM | POA: Insufficient documentation

## 2013-02-14 DIAGNOSIS — Z7983 Long term (current) use of bisphosphonates: Secondary | ICD-10-CM | POA: Insufficient documentation

## 2013-02-14 DIAGNOSIS — Z8669 Personal history of other diseases of the nervous system and sense organs: Secondary | ICD-10-CM | POA: Insufficient documentation

## 2013-02-14 DIAGNOSIS — S52502A Unspecified fracture of the lower end of left radius, initial encounter for closed fracture: Secondary | ICD-10-CM

## 2013-02-14 DIAGNOSIS — M810 Age-related osteoporosis without current pathological fracture: Secondary | ICD-10-CM | POA: Insufficient documentation

## 2013-02-14 DIAGNOSIS — S60219A Contusion of unspecified wrist, initial encounter: Secondary | ICD-10-CM | POA: Insufficient documentation

## 2013-02-14 DIAGNOSIS — F0390 Unspecified dementia without behavioral disturbance: Secondary | ICD-10-CM | POA: Insufficient documentation

## 2013-02-14 DIAGNOSIS — E785 Hyperlipidemia, unspecified: Secondary | ICD-10-CM | POA: Insufficient documentation

## 2013-02-14 HISTORY — DX: Reserved for concepts with insufficient information to code with codable children: IMO0002

## 2013-02-14 HISTORY — DX: Cerebral infarction, unspecified: I63.9

## 2013-02-14 NOTE — ED Notes (Signed)
Attempted to call report to [facility] [facility], no answer.

## 2013-02-14 NOTE — ED Notes (Signed)
Pending orthotech for L wrist sugar tong splint, pt placed on BP.

## 2013-02-14 NOTE — ED Notes (Signed)
Pt speaking with son via phone. Son updated. Pending xray.

## 2013-02-14 NOTE — ED Notes (Signed)
[place] [facility]  Fell in bathroom and caught fall with hand.  L wrist swelling, no obvious deformity.  Hx of dementia.

## 2013-02-14 NOTE — Progress Notes (Signed)
Orthopedic Tech Progress Note Patient Details:  [name] 10/27/1930 [id number]  Ortho Devices Type of Ortho Device: Ace wrap;Arm sling;Sugartong splint Ortho Device/Splint Location: LUE Ortho Device/Splint Interventions: Ordered;Application   Jennye MoccasinHughes, [name] 02/14/2013, 9:07 PM

## 2013-02-14 NOTE — Discharge Instructions (Signed)
Need to elevate and ice.  No weight placed on the left arm Cast or Splint Care Casts and splints support injured limbs and keep bones from moving while they heal. It is important to care for your cast or splint at home.  HOME CARE INSTRUCTIONS  Keep the cast or splint uncovered during the drying period. It can take 24 to 48 hours to dry if it is made of plaster. A fiberglass cast will dry in less than 1 hour.  Do not rest the cast on anything harder than a pillow for the first 24 hours.  Do not put weight on your injured limb or apply pressure to the cast until your health care provider gives you permission.  Keep the cast or splint dry. Wet casts or splints can lose their shape and may not support the limb as well. A wet cast that has lost its shape can also create harmful pressure on your skin when it dries. Also, wet skin can become infected.  Cover the cast or splint with a plastic bag when bathing or when out in the rain or snow. If the cast is on the trunk of the body, take sponge baths until the cast is removed.  If your cast does become wet, dry it with a towel or a blow dryer on the cool setting only.  Keep your cast or splint clean. Soiled casts may be wiped with a moistened cloth.  Do not place any hard or soft foreign objects under your cast or splint, such as cotton, toilet paper, lotion, or powder.  Do not try to scratch the skin under the cast with any object. The object could get stuck inside the cast. Also, scratching could lead to an infection. If itching is a problem, use a blow dryer on a cool setting to relieve discomfort.  Do not trim or cut your cast or remove padding from inside of it.  Exercise all joints next to the injury that are not immobilized by the cast or splint. For example, if you have a long leg cast, exercise the hip joint and toes. If you have an arm cast or splint, exercise the shoulder, elbow, thumb, and fingers.  Elevate your injured arm or leg on  1 or 2 pillows for the first 1 to 3 days to decrease swelling and pain.It is best if you can comfortably elevate your cast so it is higher than your heart. SEEK MEDICAL CARE IF:   Your cast or splint cracks.  Your cast or splint is too tight or too loose.  You have unbearable itching inside the cast.  Your cast becomes wet or develops a soft spot or area.  You have a bad smell coming from inside your cast.  You get an object stuck under your cast.  Your skin around the cast becomes red or raw.  You have new pain or worsening pain after the cast has been applied. SEEK IMMEDIATE MEDICAL CARE IF:   You have fluid leaking through the cast.  You are unable to move your fingers or toes.  You have discolored (blue or white), cool, painful, or very swollen fingers or toes beyond the cast.  You have tingling or numbness around the injured area.  You have severe pain or pressure under the cast.  You have any difficulty with your breathing or have shortness of breath.  You have chest pain. Document Released: 12/17/1999 Document Revised: 10/09/2012 Document Reviewed: 06/27/2012 [facility] Patient Information 2014 [place], [place].  Forearm  Fracture Your caregiver has diagnosed you as having a broken bone (fracture) of the forearm. This is the part of your arm between the elbow and your wrist. Your forearm is made up of two bones. These are the radius and ulna. A fracture is a break in one or both bones. A cast or splint is used to protect and keep your injured bone from moving. The cast or splint will be on generally for about 5 to 6 weeks, with individual variations. HOME CARE INSTRUCTIONS   Keep the injured part elevated while sitting or lying down. Keeping the injury above the level of your heart (the center of the chest). This will decrease swelling and pain.  Apply ice to the injury for 15-20 minutes, 03-04 times per day while awake, for 2 days. Put the ice in a plastic bag and place  a thin towel between the bag of ice and your cast or splint.  If you have a plaster or fiberglass cast:  Do not try to scratch the skin under the cast using sharp or pointed objects.  Check the skin around the cast every day. You may put lotion on any red or sore areas.  Keep your cast dry and clean.  If you have a plaster splint:  Wear the splint as directed.  You may loosen the elastic around the splint if your fingers become numb, tingle, or turn cold or blue.  Do not put pressure on any part of your cast or splint. It may break. Rest your cast only on a pillow the first 24 hours until it is fully hardened.  Your cast or splint can be protected during bathing with a plastic bag. Do not lower the cast or splint into water.  Only take over-the-counter or prescription medicines for pain, discomfort, or fever as directed by your caregiver. SEEK IMMEDIATE MEDICAL CARE IF:   Your cast gets damaged or breaks.  You have more severe pain or swelling than you did before the cast.  Your skin or nails below the injury turn blue or gray, or feel cold or numb.  There is a bad smell or new stains and/or pus like (purulent) drainage coming from under the cast. MAKE SURE YOU:   Understand these instructions.  Will watch your condition.  Will get help right away if you are not doing well or get worse. Document Released: 12/17/1999 Document Revised: 03/13/2011 Document Reviewed: 08/08/2007 [facility] Patient Information 2014 [place].

## 2013-02-14 NOTE — ED Notes (Signed)
Report given to Cassie A., RN 

## 2013-02-14 NOTE — ED Provider Notes (Addendum)
CSN: [id number]     Arrival date & time 02/14/13  1642 History   First MD Initiated Contact with Patient 02/14/13 1642     Chief Complaint  Patient presents with  . Wrist Injury    no obvious deformity     (Consider location/radiation/quality/duration/timing/severity/associated sxs/prior Treatment) Patient is a 80 y.o. female presenting with wrist injury. The history is provided by the patient and the EMS personnel.  Wrist Injury Location:  Wrist Injury: yes   Mechanism of injury: fall   Fall:    Fall occurred:  In the bathroom   Impact surface:  Hard floor   Point of impact:  Hands Wrist location:  L wrist Pain details:    Quality:  Aching and throbbing   Radiates to:  Does not radiate   Severity:  Moderate   Onset quality:  Sudden   Timing:  Constant   Progression:  Unchanged Chronicity:  New Handedness:  Right-handed Foreign body present:  No foreign bodies Prior injury to area:  No Relieved by:  Rest Worsened by:  Movement Ineffective treatments:  None tried Associated symptoms: decreased range of motion and swelling   Associated symptoms: no neck pain and no numbness   Associated symptoms comment:  No head injury Risk factors: no frequent fractures     Past Medical History  Diagnosis Date  . HYPERLIPIDEMIA 12/20/2006  . Senile dementia, uncomplicated 01/06/2009  . INSOMNIA-SLEEP DISORDER-UNSPEC 11/09/2006  . BLURRED VISION 06/29/2008  . HYPERTENSION 11/09/2006  . CORONARY ARTERY DISEASE 07/24/2006  . ASTHMATIC BRONCHITIS, ACUTE 01/14/2007  . ALLERGIC RHINITIS 11/09/2006  . GERD 06/04/2006  . CONSTIPATION 03/01/2009  . BACK PAIN 03/01/2009  . OSTEOPOROSIS 10/13/2009  . FATIGUE 01/21/2008  . CHEST PAIN 09/12/2007  . CEREBROVASCULAR ACCIDENT, HX OF 07/24/2006  . Goiter, unspecified 02/01/2010  . Cough 02/01/2010  . Asthma   . Chronic cough    Past Surgical History  Procedure Laterality Date  . Dilation and curettage of uterus     Family History  Problem Relation  Age of Onset  . Coronary artery disease Other    History  Substance Use Topics  . Smoking status: Never Smoker   . Smokeless tobacco: Never Used  . Alcohol Use: No   OB History   Grav Para Term Preterm Abortions TAB SAB Ect Mult Living                 Review of Systems  Musculoskeletal: Negative for neck pain.  All other systems reviewed and are negative.      Allergies  Metoclopramide hcl and Simvastatin  Home Medications   Current Outpatient Rx  Name  Route  Sig  Dispense  Refill  . alendronate (FOSAMAX) 70 MG tablet   Oral   Take 70 mg by mouth every 7 (seven) days. Take with a full glass of water on an empty stomach.         Marland Kitchen. aspirin 81 MG chewable tablet   Oral   Chew 81 mg by mouth every morning.         . calcium-vitamin D (OSCAL WITH D) 500-200 MG-UNIT per tablet   Oral   Take 1 tablet by mouth daily at 6 PM.         . cefUROXime (CEFTIN) 500 MG tablet   Oral   Take 1 tablet (500 mg total) by mouth 2 (two) times daily with a meal.   4 tablet   0   . donepezil (ARICEPT) 10 MG  tablet   Oral   Take 10 mg by mouth at bedtime.         . Memantine HCl ER (NAMENDA XR) 14 MG CP24   Oral   Take 1 tablet by mouth every morning.         . miconazole (BAZA ANTIFUNGAL) 2 % cream   Topical   Apply 1 application topically 3 (three) times daily.         . mometasone-formoterol (DULERA) 100-5 MCG/ACT AERO   Inhalation   Inhale 2 puffs into the lungs 2 (two) times daily.   8.8 g   1   . nystatin (MYCOSTATIN/NYSTOP) 100000 UNIT/GM POWD   Topical   Apply 1 g topically every morning. Under breasts and folds of stomach         . omeprazole (PRILOSEC) 20 MG capsule   Oral   Take 20 mg by mouth every morning.         . pravastatin (PRAVACHOL) 40 MG tablet   Oral   Take 40 mg by mouth every evening.          . predniSONE (DELTASONE) 20 MG tablet      Take 60mg  (3 tabs) x 2 days, then 40mg  (2 tabs) x 2 days, then 20mg  (1tab) x 2 days    12 tablet   0   . senna-docusate (SENOKOT-S) 8.6-50 MG per tablet   Oral   Take 2 tablets by mouth at bedtime.          There were no vitals taken for this visit. Physical Exam  Nursing note and vitals reviewed. Constitutional: She is oriented to person, place, and time. She appears well-developed and well-nourished. No distress.  HENT:  Head: Normocephalic and atraumatic.  Mouth/Throat: Oropharynx is clear and moist.  Eyes: Conjunctivae and EOM are normal. Pupils are equal, round, and reactive to light.  Neck: Normal range of motion. Neck supple.  Cardiovascular: Normal rate, regular rhythm and intact distal pulses.   No murmur heard. Pulmonary/Chest: Effort normal and breath sounds normal. No respiratory distress. She has no wheezes. She has no rales.  Abdominal: Soft. She exhibits no distension. There is no tenderness. There is no rebound and no guarding.  Musculoskeletal: She exhibits tenderness. She exhibits no edema.       Left wrist: She exhibits decreased range of motion, tenderness, bony tenderness and deformity.       Cervical back: Normal.       Thoracic back: Normal.       Lumbar back: Normal.       Arms: Hematoma over the dorsal wrist  Neurological: She is alert and oriented to person, place, and time.  Mild dementia but able to answer questions appropriately  Skin: Skin is warm and dry. No rash noted. No erythema.  Psychiatric: She has a normal mood and affect. Her behavior is normal.    ED Course  Procedures (including critical care time) Labs Review Labs Reviewed - No data to display Imaging Review Dg Wrist 2 Views Left  02/14/2013   CLINICAL DATA:  Post reduction  EXAM: LEFT WRIST - 2 VIEW  COMPARISON:  Earlier same day  FINDINGS: There has been very minimally improved alignment of previously noted distal radial fracture with persistent dorsal angulation and foreshortening of the distal radial fracture fragment.  The nondisplaced distal ulnar fracture  appears grossly unchanged.  No definite dislocation. Similar appearing adjacent soft tissue swelling. No definite radiopaque foreign body.  IMPRESSION: 1. Minimally improved  alignment of distal radial fracture with residual displacement. 2. Unchanged appearance of nondisplaced distal ulnar fracture.   Electronically Signed   By: Simonne Come M.D.   On: 02/14/2013 19:40   Dg Wrist Complete Left  02/14/2013   CLINICAL DATA:  Left wrist deformity after fall.  EXAM: LEFT WRIST - COMPLETE 3+ VIEW  COMPARISON:  None.  FINDINGS: Moderately and anteriorly displaced fracture is seen involving the distal radius. Mildly displaced fracture of distal ulna is noted as well.  IMPRESSION: Displaced distal radial and ulnar fractures.   Electronically Signed   By: Roque Lias M.D.   On: 02/14/2013 18:32    EKG Interpretation   None       MDM   Final diagnoses:  Distal radius fracture, left  Traumatic closed nondisplaced fracture of distal end of ulna   Pt with mechanical fall in the bathroom at the nursing home.  C/o of pain over the left wrist with hematoma.  No break in the skin.  N/V intact and good sensation and movement to the hand and fingers.  No head injury, neck pain or other areas of pain.  Plain film of the wrist pending.  7:40 PM Evidence of displaced distal radial and ulnar fx.  Hematoma block and attempted reduction at bedside with not much improvement on displacement.  Will discuss with Hand.  8:25 PM Pt placed in sugar tong splint and have f/u with Dr. Mina Marble on tues.  Pt is currently comfortable and pain controlled.  N/V intact after splint placed.  Gwyneth Sprout, MD 02/14/13 1610  Gwyneth Sprout, MD 02/14/13 2028

## 2013-02-14 NOTE — ED Notes (Signed)
L wrist reduced with Dr Anitra LauthPlunkett

## 2013-02-14 NOTE — ED Notes (Signed)
Report called to family (updated at home via phone), report called to [facility], [facility] here for transport, splint and sling in place, no changes, alert, NAD, calm, interactive, CMS intact, hand warm, cap refill <2sec. Belongings bagged and labeled,  2 rings in specimen cup and labled in bag.

## 2013-02-14 NOTE — ED Notes (Signed)
Alert, NAD, calm, interactive, resps e/u, speaking clear complete sentences, admits to L wrist pain when asked, h/o dementia, pending post reduction xray. (denies: nausea, sob or dizziness).

## 2013-02-14 NOTE — ED Notes (Signed)
X-ray called and stated they are coming to perform portable at bedside.

## 2013-10-13 ENCOUNTER — Encounter (HOSPITAL_COMMUNITY): Payer: Self-pay | Admitting: Emergency Medicine

## 2013-10-13 ENCOUNTER — Emergency Department (HOSPITAL_COMMUNITY): Payer: Medicare Other

## 2013-10-13 ENCOUNTER — Emergency Department (HOSPITAL_COMMUNITY)
Admission: EM | Admit: 2013-10-13 | Discharge: 2013-10-13 | Disposition: A | Discharge status: Home / Self Care | Payer: Medicare Other | Attending: Emergency Medicine | Admitting: Emergency Medicine

## 2013-10-13 DIAGNOSIS — W06XXXA Fall from bed, initial encounter: Secondary | ICD-10-CM | POA: Diagnosis not present

## 2013-10-13 DIAGNOSIS — S0990XA Unspecified injury of head, initial encounter: Secondary | ICD-10-CM | POA: Diagnosis present

## 2013-10-13 DIAGNOSIS — I2510 Atherosclerotic heart disease of native coronary artery without angina pectoris: Secondary | ICD-10-CM | POA: Diagnosis not present

## 2013-10-13 DIAGNOSIS — Y92199 Unspecified place in other specified residential institution as the place of occurrence of the external cause: Secondary | ICD-10-CM | POA: Insufficient documentation

## 2013-10-13 DIAGNOSIS — I10 Essential (primary) hypertension: Secondary | ICD-10-CM | POA: Insufficient documentation

## 2013-10-13 DIAGNOSIS — Z8673 Personal history of transient ischemic attack (TIA), and cerebral infarction without residual deficits: Secondary | ICD-10-CM | POA: Insufficient documentation

## 2013-10-13 DIAGNOSIS — K219 Gastro-esophageal reflux disease without esophagitis: Secondary | ICD-10-CM | POA: Diagnosis not present

## 2013-10-13 DIAGNOSIS — Z79899 Other long term (current) drug therapy: Secondary | ICD-10-CM | POA: Insufficient documentation

## 2013-10-13 DIAGNOSIS — E785 Hyperlipidemia, unspecified: Secondary | ICD-10-CM | POA: Diagnosis not present

## 2013-10-13 DIAGNOSIS — Z7982 Long term (current) use of aspirin: Secondary | ICD-10-CM | POA: Diagnosis not present

## 2013-10-13 DIAGNOSIS — F0390 Unspecified dementia without behavioral disturbance: Secondary | ICD-10-CM | POA: Diagnosis not present

## 2013-10-13 DIAGNOSIS — J45909 Unspecified asthma, uncomplicated: Secondary | ICD-10-CM | POA: Diagnosis not present

## 2013-10-13 NOTE — ED Provider Notes (Signed)
CSN: [id number]     Arrival date & time 10/13/13  0550 History   First MD Initiated Contact with Patient 10/13/13 0604     Chief Complaint  Patient presents with  . Fall     (Consider location/radiation/quality/duration/timing/severity/associated sxs/prior Treatment) HPI Comments: Patient here after rolling out of bed at the nursing home. Unknown loss of consciousness. She has been at her baseline. Does have a hematoma to her left frontal scalp. No reported increased confusion. No emesis noted. EMS was called and patient transported here. Symptoms persistent and nothing makes them better or worse. No treatment used prior to arrival  Patient is a 80 y.o. female presenting with fall. The history is provided by the patient and the EMS personnel. The history is limited by the condition of the patient.  Fall    Past Medical History  Diagnosis Date  . HYPERLIPIDEMIA 12/20/2006  . Senile dementia, uncomplicated 01/06/2009  . INSOMNIA-SLEEP DISORDER-UNSPEC 11/09/2006  . BLURRED VISION 06/29/2008  . HYPERTENSION 11/09/2006  . CORONARY ARTERY DISEASE 07/24/2006  . ASTHMATIC BRONCHITIS, ACUTE 01/14/2007  . ALLERGIC RHINITIS 11/09/2006  . GERD 06/04/2006  . CONSTIPATION 03/01/2009  . BACK PAIN 03/01/2009  . OSTEOPOROSIS 10/13/2009  . FATIGUE 01/21/2008  . CHEST PAIN 09/12/2007  . CEREBROVASCULAR ACCIDENT, HX OF 07/24/2006  . Goiter, unspecified 02/01/2010  . Cough 02/01/2010  . Asthma   . Chronic cough   . Stroke   . Degenerative disc disease    Past Surgical History  Procedure Laterality Date  . Dilation and curettage of uterus     Family History  Problem Relation Age of Onset  . Coronary artery disease Other    History  Substance Use Topics  . Smoking status: Never Smoker   . Smokeless tobacco: Never Used  . Alcohol Use: No   OB History   Grav Para Term Preterm Abortions TAB SAB Ect Mult Living                 Review of Systems  Unable to perform ROS     Allergies    Metoclopramide hcl and Simvastatin  Home Medications   Prior to Admission medications   Medication Sig Start Date End Date Taking? Authorizing Provider  alendronate (FOSAMAX) 35 MG tablet Take 35 mg by mouth every 7 (seven) days. Take with a full glass of water on an empty stomach. On Thursdays    Historical Provider, MD  aspirin 81 MG chewable tablet Chew 81 mg by mouth daily.     Historical Provider, MD  cholecalciferol (VITAMIN D-400) 400 UNITS TABS tablet Take 800 Units by mouth daily.    Historical Provider, MD  donepezil (ARICEPT) 10 MG tablet Take 10 mg by mouth at bedtime. 01/30/11   Corwin LevinsJames W [name], MD  mometasone-formoterol (DULERA) 100-5 MCG/ACT AERO Inhale 2 puffs into the lungs 2 (two) times daily. 05/02/12   Catarina Hartshornavid Tat, MD  montelukast (SINGULAIR) 10 MG tablet Take 10 mg by mouth daily.  01/29/13   Historical Provider, MD  Multiple Vitamin (MULTIVITAMIN WITH MINERALS) TABS tablet Take 1 tablet by mouth daily.    Historical Provider, MD  neomycin-bacitracin-polymyxin (NEOSPORIN) OINT Apply 1 application topically daily. Apply to right upper back and small open areas under breast (after washing with soap and water) until heales    Historical Provider, MD  omeprazole (PRILOSEC) 20 MG capsule Take 20 mg by mouth daily before breakfast.     Historical Provider, MD  pravastatin (PRAVACHOL) 40 MG tablet Take 40 mg  by mouth at bedtime.     Historical Provider, MD  senna-docusate (SENOKOT-S) 8.6-50 MG per tablet Take 2 tablets by mouth at bedtime.    Historical Provider, MD  Skin Protectants, Misc. (BAZA PROTECT EX) Apply 1 application topically 3 (three) times daily. Apply to reddened area of buttock    Historical Provider, MD   BP 147/61  Pulse 73  Temp(Src) 97.8 F (36.6 C) (Oral)  Resp 17  SpO2 100% Physical Exam  Nursing note and vitals reviewed. Constitutional: She appears well-developed and well-nourished.  Non-toxic appearance. No distress.  HENT:  Head: Normocephalic and  atraumatic.    Eyes: Conjunctivae, EOM and lids are normal. Pupils are equal, round, and reactive to light.  Neck: Normal range of motion. Neck supple. No spinous process tenderness and no muscular tenderness present. No tracheal deviation present. No mass present.  Cardiovascular: Normal rate, regular rhythm and normal heart sounds.  Exam reveals no gallop.   No murmur heard. Pulmonary/Chest: Effort normal and breath sounds normal. No stridor. No respiratory distress. She has no decreased breath sounds. She has no wheezes. She has no rhonchi. She has no rales. She exhibits no tenderness.  Abdominal: Soft. Normal appearance and bowel sounds are normal. She exhibits no distension. There is no tenderness. There is no rebound and no CVA tenderness.  Musculoskeletal: Normal range of motion. She exhibits no edema and no tenderness.  Neurological: She is alert. She has normal strength. No cranial nerve deficit or sensory deficit. GCS eye subscore is 4. GCS verbal subscore is 5. GCS motor subscore is 6.  Skin: Skin is warm and dry. No abrasion and no rash noted.  Psychiatric: She has a normal mood and affect. Her speech is normal and behavior is normal.    ED Course  Procedures (including critical care time) Labs Review Labs Reviewed - No data to display  Imaging Review No results found.   EKG Interpretation None      MDM   Final diagnoses:  None    Head ct neg, pt stable for d/c    Toy BakerAnthony T Laurie Penado, MD 10/13/13 [phone]

## 2013-10-13 NOTE — ED Notes (Signed)
Bed: WA17 Expected date: 10/13/13 Expected time: 5:36 AM Means of arrival: Ambulance Comments: EMS  Fall head injury

## 2013-10-13 NOTE — ED Notes (Signed)
Per EMS pt is from SNF and fell out of bed this morning.  Pt has a hx of dementia and is at baseline.  Hematoma noted to top left side of head.

## 2013-10-13 NOTE — Discharge Instructions (Signed)

## 2013-11-19 ENCOUNTER — Emergency Department (HOSPITAL_COMMUNITY): Payer: Medicare Other

## 2013-11-19 ENCOUNTER — Encounter (HOSPITAL_COMMUNITY): Payer: Self-pay | Admitting: Emergency Medicine

## 2013-11-19 ENCOUNTER — Emergency Department (HOSPITAL_COMMUNITY)
Admission: EM | Admit: 2013-11-19 | Discharge: 2013-11-19 | Disposition: A | Discharge status: Home / Self Care | Payer: Medicare Other | Attending: Emergency Medicine | Admitting: Emergency Medicine

## 2013-11-19 DIAGNOSIS — E785 Hyperlipidemia, unspecified: Secondary | ICD-10-CM | POA: Diagnosis not present

## 2013-11-19 DIAGNOSIS — F0390 Unspecified dementia without behavioral disturbance: Secondary | ICD-10-CM | POA: Diagnosis not present

## 2013-11-19 DIAGNOSIS — Z7982 Long term (current) use of aspirin: Secondary | ICD-10-CM | POA: Diagnosis not present

## 2013-11-19 DIAGNOSIS — Z79899 Other long term (current) drug therapy: Secondary | ICD-10-CM | POA: Diagnosis not present

## 2013-11-19 DIAGNOSIS — N390 Urinary tract infection, site not specified: Secondary | ICD-10-CM | POA: Diagnosis not present

## 2013-11-19 DIAGNOSIS — Z8739 Personal history of other diseases of the musculoskeletal system and connective tissue: Secondary | ICD-10-CM | POA: Insufficient documentation

## 2013-11-19 DIAGNOSIS — Z792 Long term (current) use of antibiotics: Secondary | ICD-10-CM | POA: Insufficient documentation

## 2013-11-19 DIAGNOSIS — K219 Gastro-esophageal reflux disease without esophagitis: Secondary | ICD-10-CM | POA: Insufficient documentation

## 2013-11-19 DIAGNOSIS — Z8673 Personal history of transient ischemic attack (TIA), and cerebral infarction without residual deficits: Secondary | ICD-10-CM | POA: Diagnosis not present

## 2013-11-19 DIAGNOSIS — I10 Essential (primary) hypertension: Secondary | ICD-10-CM | POA: Diagnosis not present

## 2013-11-19 DIAGNOSIS — R55 Syncope and collapse: Secondary | ICD-10-CM

## 2013-11-19 DIAGNOSIS — I2510 Atherosclerotic heart disease of native coronary artery without angina pectoris: Secondary | ICD-10-CM | POA: Diagnosis not present

## 2013-11-19 DIAGNOSIS — J45909 Unspecified asthma, uncomplicated: Secondary | ICD-10-CM | POA: Diagnosis not present

## 2013-11-19 LAB — URINALYSIS, ROUTINE W REFLEX MICROSCOPIC
BILIRUBIN URINE: NEGATIVE
Glucose, UA: NEGATIVE mg/dL
Ketones, ur: NEGATIVE mg/dL
Nitrite: POSITIVE — AB
PH: 7 (ref 5.0–8.0)
Protein, ur: NEGATIVE mg/dL
SPECIFIC GRAVITY, URINE: 1.014 (ref 1.005–1.030)
UROBILINOGEN UA: 0.2 mg/dL (ref 0.0–1.0)

## 2013-11-19 LAB — BASIC METABOLIC PANEL
Anion gap: 13 (ref 5–15)
BUN: 16 mg/dL (ref 6–23)
CHLORIDE: 104 meq/L (ref 96–112)
CO2: 25 meq/L (ref 19–32)
CREATININE: 0.73 mg/dL (ref 0.50–1.10)
Calcium: 9.7 mg/dL (ref 8.4–10.5)
GFR calc non Af Amer: 78 mL/min — ABNORMAL LOW (ref >90)
GLUCOSE: 119 mg/dL — AB (ref 70–99)
POTASSIUM: 4.2 meq/L (ref 3.7–5.3)
Sodium: 142 mEq/L (ref 137–147)

## 2013-11-19 LAB — CBC WITH DIFFERENTIAL/PLATELET
Basophils Absolute: 0 10*3/uL (ref 0.0–0.1)
Basophils Relative: 0 % (ref 0–1)
Eosinophils Absolute: 0.1 10*3/uL (ref 0.0–0.7)
Eosinophils Relative: 2 % (ref 0–5)
HEMATOCRIT: 40.8 % (ref 36.0–46.0)
HEMOGLOBIN: 13 g/dL (ref 12.0–15.0)
LYMPHS ABS: 1.4 10*3/uL (ref 0.7–4.0)
LYMPHS PCT: 21 % (ref 12–46)
MCH: 29.1 pg (ref 26.0–34.0)
MCHC: 31.9 g/dL (ref 30.0–36.0)
MCV: 91.5 fL (ref 78.0–100.0)
MONO ABS: 0.5 10*3/uL (ref 0.1–1.0)
MONOS PCT: 8 % (ref 3–12)
NEUTROS ABS: 4.7 10*3/uL (ref 1.7–7.7)
NEUTROS PCT: 69 % (ref 43–77)
Platelets: 212 10*3/uL (ref 150–400)
RBC: 4.46 MIL/uL (ref 3.87–5.11)
RDW: 13 % (ref 11.5–15.5)
WBC: 6.8 10*3/uL (ref 4.0–10.5)

## 2013-11-19 LAB — URINE MICROSCOPIC-ADD ON

## 2013-11-19 MED ORDER — CEPHALEXIN 500 MG PO CAPS: 500.0000 mg | ORAL_CAPSULE | Freq: Four times a day (QID) | ORAL | Status: DC

## 2013-11-19 MED ORDER — DEXTROSE 5 % IV SOLN
1.0000 g | Freq: Once | INTRAVENOUS | Status: AC
  Administered 2013-11-19: 1 g via INTRAVENOUS
  Filled 2013-11-19: qty 10

## 2013-11-19 NOTE — Discharge Instructions (Signed)
Workup here revealed a urinary tract infection. Patient given 1 g of Rocephin urine sent for culture. Would recommend continuing Keflex. Return for any new or worse symptoms. Rest of workup was negative.

## 2013-11-19 NOTE — ED Notes (Signed)
Per EMS, Pt coming from skilled nursing facility [facility] . Pt was in the shower and became dizzy, light headed and nauseated. Staff moved pt out of shower into the day room and all symptoms resolved. Pt sent out for evaluation because she looked pale. Pt has hx of dementia and does not remember the incident.

## 2013-11-19 NOTE — ED Notes (Signed)
Unsuccessful 2nd iv start after x 2 sticks.

## 2013-11-19 NOTE — ED Provider Notes (Signed)
CSN: [id number]     Arrival date & time 11/19/13  0910 History   First MD Initiated Contact with Patient 11/19/13 [id number]     Chief Complaint  Patient presents with  . Near Syncope     (Consider location/radiation/quality/duration/timing/severity/associated sxs/prior Treatment) Patient is a 80 y.o. female presenting with near-syncope. The history is provided by the patient, the EMS personnel and the nursing home. The history is limited by the condition of the patient.  Near Syncope  level V caveat applies to obtaining historical information due to her dementia. All information provided by EMS and nursing home. Patient denies any problems here at all.  Patient brought in by EMS from skilled nursing facility. Patient was in the shower this morning and became dizzy lightheaded and nauseated. Patient appeared pale to staff so sent in for evaluation. Patient did not pass out.  Past Medical History  Diagnosis Date  . HYPERLIPIDEMIA 12/20/2006  . Senile dementia, uncomplicated 01/06/2009  . INSOMNIA-SLEEP DISORDER-UNSPEC 11/09/2006  . BLURRED VISION 06/29/2008  . HYPERTENSION 11/09/2006  . CORONARY ARTERY DISEASE 07/24/2006  . ASTHMATIC BRONCHITIS, ACUTE 01/14/2007  . ALLERGIC RHINITIS 11/09/2006  . GERD 06/04/2006  . CONSTIPATION 03/01/2009  . BACK PAIN 03/01/2009  . OSTEOPOROSIS 10/13/2009  . FATIGUE 01/21/2008  . CHEST PAIN 09/12/2007  . CEREBROVASCULAR ACCIDENT, HX OF 07/24/2006  . Goiter, unspecified 02/01/2010  . Cough 02/01/2010  . Asthma   . Chronic cough   . Stroke   . Degenerative disc disease    Past Surgical History  Procedure Laterality Date  . Dilation and curettage of uterus     Family History  Problem Relation Age of Onset  . Coronary artery disease Other    History  Substance Use Topics  . Smoking status: Never Smoker   . Smokeless tobacco: Never Used  . Alcohol Use: No   OB History    No data available     Review of Systems  Unable to perform ROS Cardiovascular:  Positive for near-syncope.  level V caveat applies due to history of dementia.    Allergies  Metoclopramide hcl and Simvastatin  Home Medications   Prior to Admission medications   Medication Sig Start Date End Date Taking? Authorizing Provider  alendronate (FOSAMAX) 35 MG tablet Take 35 mg by mouth every 7 (seven) days. Take with a full glass of water on an empty stomach. On Sunday   Yes Historical Provider, MD  aspirin 81 MG chewable tablet Chew 81 mg by mouth daily.    Yes Historical Provider, MD  Calcium Carbonate-Vitamin D (CALCIUM 600/VITAMIN D) 600-400 MG-UNIT per tablet Take 1 tablet by mouth 2 (two) times daily.   Yes Historical Provider, MD  donepezil (ARICEPT) 10 MG tablet Take 10 mg by mouth at bedtime. 01/30/11  Yes Corwin LevinsJames W [name], MD  Fluticasone-Salmeterol (ADVAIR) 250-50 MCG/DOSE AEPB Inhale 1 puff into the lungs 2 (two) times daily.   Yes Historical Provider, MD  montelukast (SINGULAIR) 10 MG tablet Take 10 mg by mouth daily.  01/29/13  Yes Historical Provider, MD  Multiple Vitamin (MULTIVITAMIN WITH MINERALS) TABS tablet Take 1 tablet by mouth daily.   Yes Historical Provider, MD  neomycin-bacitracin-polymyxin (NEOSPORIN) OINT Apply 1 application topically daily. Apply to right upper back and small open areas under breast (after washing with soap and water) until heales   Yes Historical Provider, MD  omeprazole (PRILOSEC) 20 MG capsule Take 20 mg by mouth daily before breakfast.    Yes Historical Provider, MD  pravastatin (  PRAVACHOL) 40 MG tablet Take 40 mg by mouth at bedtime.    Yes Historical Provider, MD  senna-docusate (SENOKOT-S) 8.6-50 MG per tablet Take 2 tablets by mouth at bedtime.   Yes Historical Provider, MD  traMADol (ULTRAM) 50 MG tablet Take 50 mg by mouth every 6 (six) hours as needed for moderate pain.   Yes Historical Provider, MD  cephALEXin (KEFLEX) 500 MG capsule Take 1 capsule (500 mg total) by mouth 4 (four) times daily. 11/19/13   Vanetta MuldersScott Shakaria Raphael, MD    BP 124/73 mmHg  Pulse 72  Temp(Src) 97.5 F (36.4 C) (Oral)  Resp 18  SpO2 99% Physical Exam  Constitutional: She appears well-developed and well-nourished. No distress.  HENT:  Head: Normocephalic and atraumatic.  Mouth/Throat: Oropharynx is clear and moist.  Eyes: Conjunctivae and EOM are normal. Pupils are equal, round, and reactive to light.  Neck: Normal range of motion.  Cardiovascular: Normal rate, regular rhythm and normal heart sounds.   No murmur heard. Pulmonary/Chest: Effort normal and breath sounds normal. No respiratory distress.  Abdominal: Soft. Bowel sounds are normal. There is no tenderness.  Musculoskeletal: Normal range of motion. She exhibits edema.  Neurological: She is alert. No cranial nerve deficit. She exhibits normal muscle tone. Coordination normal.  Skin: Skin is warm. No rash noted.  Nursing note and vitals reviewed.   ED Course  Procedures (including critical care time) Labs Review Labs Reviewed  BASIC METABOLIC PANEL - Abnormal; Notable for the following:    Glucose, Bld 119 (*)    GFR calc non Af Amer 78 (*)    All other components within normal limits  URINALYSIS, ROUTINE W REFLEX MICROSCOPIC - Abnormal; Notable for the following:    APPearance CLOUDY (*)    Hgb urine dipstick MODERATE (*)    Nitrite POSITIVE (*)    Leukocytes, UA LARGE (*)    All other components within normal limits  URINE MICROSCOPIC-ADD ON - Abnormal; Notable for the following:    Bacteria, UA MANY (*)    All other components within normal limits  URINE CULTURE  CBC WITH DIFFERENTIAL   Results for orders placed or performed during the hospital encounter of 11/19/13  CBC with Differential  Result Value Ref Range   WBC 6.8 4.0 - 10.5 K/uL   RBC 4.46 3.87 - 5.11 MIL/uL   Hemoglobin 13.0 12.0 - 15.0 g/dL   HCT 16.140.8 09.636.0 - 04.546.0 %   MCV 91.5 78.0 - 100.0 fL   MCH 29.1 26.0 - 34.0 pg   MCHC 31.9 30.0 - 36.0 g/dL   RDW 40.913.0 81.111.5 - 91.415.5 %   Platelets 212 150 - 400  K/uL   Neutrophils Relative % 69 43 - 77 %   Neutro Abs 4.7 1.7 - 7.7 K/uL   Lymphocytes Relative 21 12 - 46 %   Lymphs Abs 1.4 0.7 - 4.0 K/uL   Monocytes Relative 8 3 - 12 %   Monocytes Absolute 0.5 0.1 - 1.0 K/uL   Eosinophils Relative 2 0 - 5 %   Eosinophils Absolute 0.1 0.0 - 0.7 K/uL   Basophils Relative 0 0 - 1 %   Basophils Absolute 0.0 0.0 - 0.1 K/uL  Basic metabolic panel  Result Value Ref Range   Sodium 142 137 - 147 mEq/L   Potassium 4.2 3.7 - 5.3 mEq/L   Chloride 104 96 - 112 mEq/L   CO2 25 19 - 32 mEq/L   Glucose, Bld 119 (H) 70 - 99 mg/dL  BUN 16 6 - 23 mg/dL   Creatinine, Ser 1.61 0.50 - 1.10 mg/dL   Calcium 9.7 8.4 - 09.6 mg/dL   GFR calc non Af Amer 78 (L) >90 mL/min   GFR calc Af Amer >90 >90 mL/min   Anion gap 13 5 - 15  Urinalysis, Routine w reflex microscopic  Result Value Ref Range   Color, Urine YELLOW YELLOW   APPearance CLOUDY (A) CLEAR   Specific Gravity, Urine 1.014 1.005 - 1.030   pH 7.0 5.0 - 8.0   Glucose, UA NEGATIVE NEGATIVE mg/dL   Hgb urine dipstick MODERATE (A) NEGATIVE   Bilirubin Urine NEGATIVE NEGATIVE   Ketones, ur NEGATIVE NEGATIVE mg/dL   Protein, ur NEGATIVE NEGATIVE mg/dL   Urobilinogen, UA 0.2 0.0 - 1.0 mg/dL   Nitrite POSITIVE (A) NEGATIVE   Leukocytes, UA LARGE (A) NEGATIVE  Urine microscopic-add on  Result Value Ref Range   Squamous Epithelial / LPF RARE RARE   WBC, UA TOO NUMEROUS TO COUNT <3 WBC/hpf   RBC / HPF 3-6 <3 RBC/hpf   Bacteria, UA MANY (A) RARE     Imaging Review Dg Chest 2 View  11/19/2013   CLINICAL DATA:  80 year old female with near syncope. Initial encounter.  EXAM: CHEST  2 VIEW  COMPARISON:  04/29/2012 and earlier.  FINDINGS: Seated upright AP and lateral views of the chest. Sequelae of CABG. Stable cardiac size and mediastinal contours. Tortuous thoracic aorta with calcified atherosclerosis. No pneumothorax, pulmonary edema, pleural effusion or confluent pulmonary opacity. Osteopenia. Chronic  degenerative changes at the shoulders. No acute osseous abnormality identified.  IMPRESSION: No acute cardiopulmonary abnormality.   Electronically Signed   By: Augusto Gamble M.D.   On: 11/19/2013 10:54     EKG Interpretation   Date/Time:  Wednesday November 19 2013 09:59:25 EST Ventricular Rate:  74 PR Interval:  61 QRS Duration: 155 QT Interval:  486 QTC Calculation: 539 R Axis:   81 Text Interpretation:  Sinus rhythm Short PR interval Right bundle branch  block Artifact Confirmed by Meriam Chojnowski  MD, Erion Hermans ([id number]) on 11/19/2013  10:25:53 AM      MDM   Final diagnoses:  Near syncope  UTI (lower urinary tract infection)    Patient brought in by EMS from nursing facility. The patient became dizzy and lightheaded and nausea gated. No vomiting. Sent in for evaluation. Patient has a history of dementia. Level V caveat supplied. Workup negative except for urinary tract infection. Urine culture sent. Patient started on Rocephin 1 g IV piggyback. Patient will be continued on Keflex and sent back to nursing facility.    Vanetta Mulders, MD 11/19/13 1444

## 2013-11-19 NOTE — ED Notes (Signed)
IV team at the bedside. 

## 2013-11-21 LAB — URINE CULTURE: Colony Count: >=100000

## 2013-11-23 ENCOUNTER — Telehealth (HOSPITAL_COMMUNITY): Payer: Self-pay

## 2013-11-23 NOTE — Telephone Encounter (Signed)
Post ED Visit - Positive Culture Follow-up  Culture report reviewed by antimicrobial stewardship pharmacist: []  Wes Dulaney, Pharm.D., BCPS []  Celedonio MiyamotoJeremy Frens, Pharm.D., BCPS [x]  Georgina PillionElizabeth Martin, Pharm.D., BCPS []  MidfieldMinh Pham, [place].D., BCPS, AAHIVP []  Estella HuskMichelle Turner, Pharm.D., BCPS, AAHIVP []  Babs BertinHaley Baird, [address]Ph[address]   Positive Urine culture, >/= 100,000 colonies -> E Coli Treated with Cephalexin, organism sensitive to the same and no further patient follow-up is required at this time.  Arvid RightClark, Mariame Rybolt Dorn 11/23/2013, 4:36 AM

## 2014-10-21 ENCOUNTER — Emergency Department (INDEPENDENT_AMBULATORY_CARE_PROVIDER_SITE_OTHER)
Admission: EM | Admit: 2014-10-21 | Discharge: 2014-10-21 | Disposition: A | Discharge status: Home / Self Care | Payer: Medicare Other | Source: Home / Self Care | Attending: Emergency Medicine | Admitting: Emergency Medicine

## 2014-10-21 ENCOUNTER — Encounter (HOSPITAL_COMMUNITY): Payer: Self-pay | Admitting: Emergency Medicine

## 2014-10-21 DIAGNOSIS — B379 Candidiasis, unspecified: Secondary | ICD-10-CM

## 2014-10-21 DIAGNOSIS — R21 Rash and other nonspecific skin eruption: Secondary | ICD-10-CM

## 2014-10-21 DIAGNOSIS — J069 Acute upper respiratory infection, unspecified: Secondary | ICD-10-CM | POA: Diagnosis not present

## 2014-10-21 MED ORDER — FLUCONAZOLE 150 MG PO TABS: 150.0000 mg | ORAL_TABLET | ORAL | Status: DC

## 2014-10-21 MED ORDER — TRIAMCINOLONE ACETONIDE 0.1 % EX CREA: 1.0000 "application " | TOPICAL_CREAM | Freq: Two times a day (BID) | CUTANEOUS | Status: DC

## 2014-10-21 NOTE — ED Notes (Signed)
Cold symptoms Rash on arm, chest and buttocks-went to dermatologist but not seen because of medicare. Back pain-right side-new complaint per son

## 2014-10-21 NOTE — Discharge Instructions (Signed)
She has a Candida infection on her butt. She should take Diflucan once a week for 4 weeks.  She has a mild cold. Make sure she is taking plenty of fluids. She can have over-the-counter Hall's as needed for sore throat or cough.  I'm not sure what the rash is, but it is not shingles. Try triamcinolone cream twice a day for the next week.  If she develops fevers or change in mental status, she should go to the emergency room.

## 2014-10-21 NOTE — ED Provider Notes (Signed)
CSN: [id number]     Arrival date & time 10/21/14  1600 History   First MD Initiated Contact with Patient 10/21/14 1723     No chief complaint on file.  (Consider location/radiation/quality/duration/timing/severity/associated sxs/prior Treatment) HPI  She is a pleasantly demented 81 year old woman here with her son for evaluation of rash and several other concerns.  The rash is located on her upper arms. She states it is itchy. The son reports it has been there for at least 3 weeks. The nurses said it is maybe getting a little bit better. He does not know what it looked like previously. No fevers or change in mental status.  She also complained of some nasal congestion and sore throat today. She has had intermittent diarrhea over the last 2 weeks. She has had some cough. No ear pain. No fevers.  She also reports some perianal pain and irritation.  Past Medical History  Diagnosis Date  . HYPERLIPIDEMIA 12/20/2006  . Senile dementia, uncomplicated 01/06/2009  . INSOMNIA-SLEEP DISORDER-UNSPEC 11/09/2006  . BLURRED VISION 06/29/2008  . HYPERTENSION 11/09/2006  . CORONARY ARTERY DISEASE 07/24/2006  . ASTHMATIC BRONCHITIS, ACUTE 01/14/2007  . ALLERGIC RHINITIS 11/09/2006  . GERD 06/04/2006  . CONSTIPATION 03/01/2009  . BACK PAIN 03/01/2009  . OSTEOPOROSIS 10/13/2009  . FATIGUE 01/21/2008  . CHEST PAIN 09/12/2007  . CEREBROVASCULAR ACCIDENT, HX OF 07/24/2006  . Goiter, unspecified 02/01/2010  . Cough 02/01/2010  . Asthma   . Chronic cough   . Stroke (HCC)   . Degenerative disc disease    Past Surgical History  Procedure Laterality Date  . Dilation and curettage of uterus     Family History  Problem Relation Age of Onset  . Coronary artery disease Other    Social History  Substance Use Topics  . Smoking status: Never Smoker   . Smokeless tobacco: Never Used  . Alcohol Use: No   OB History    No data available     Review of Systems As in history of present illness Allergies   Metoclopramide hcl and Simvastatin  Home Medications   Prior to Admission medications   Medication Sig Start Date End Date Taking? Authorizing Provider  Albuterol Sulfate (PROAIR HFA IN) Inhale into the lungs.   Yes Historical Provider, MD  Chloroxylenol-Zinc Oxide (BAZA EX) Apply topically.   Yes Historical Provider, MD  escitalopram (LEXAPRO) 10 MG tablet Take 10 mg by mouth daily.   Yes Historical Provider, MD  Fluticasone Furoate-Vilanterol (BREO ELLIPTA IN) Inhale into the lungs.   Yes Historical Provider, MD  Miconazole Nitrate 2 % OINT Apply topically.   Yes Historical Provider, MD  alendronate (FOSAMAX) 35 MG tablet Take 35 mg by mouth every 7 (seven) days. Take with a full glass of water on an empty stomach. On Sunday    Historical Provider, MD  aspirin 81 MG chewable tablet Chew 81 mg by mouth daily.     Historical Provider, MD  Calcium Carbonate-Vitamin D (CALCIUM 600/VITAMIN D) 600-400 MG-UNIT per tablet Take 1 tablet by mouth 2 (two) times daily.    Historical Provider, MD  donepezil (ARICEPT) 10 MG tablet Take 10 mg by mouth at bedtime. 01/30/11   Corwin LevinsJames W [name], MD  fluconazole (DIFLUCAN) 150 MG tablet Take 1 tablet (150 mg total) by mouth once a week. Take 2nd pill if symptoms not completely resolved in 3 days. 10/21/14   Charm RingsErin J Auriana Scalia, MD  Fluticasone-Salmeterol (ADVAIR) 250-50 MCG/DOSE AEPB Inhale 1 puff into the lungs 2 (two) times  daily.    Historical Provider, MD  montelukast (SINGULAIR) 10 MG tablet Take 10 mg by mouth daily.  01/29/13   Historical Provider, MD  Multiple Vitamin (MULTIVITAMIN WITH MINERALS) TABS tablet Take 1 tablet by mouth daily.    Historical Provider, MD  neomycin-bacitracin-polymyxin (NEOSPORIN) OINT Apply 1 application topically daily. Apply to right upper back and small open areas under breast (after washing with soap and water) until heales    Historical Provider, MD  omeprazole (PRILOSEC) 20 MG capsule Take 20 mg by mouth daily before breakfast.      Historical Provider, MD  pravastatin (PRAVACHOL) 40 MG tablet Take 40 mg by mouth at bedtime.     Historical Provider, MD  senna-docusate (SENOKOT-S) 8.6-50 MG per tablet Take 2 tablets by mouth at bedtime.    Historical Provider, MD  traMADol (ULTRAM) 50 MG tablet Take 50 mg by mouth every 6 (six) hours as needed for moderate pain.    Historical Provider, MD  triamcinolone cream (KENALOG) 0.1 % Apply 1 application topically 2 (two) times daily. For 1 week 10/21/14   Charm Rings, MD   Meds Ordered and Administered this Visit  Medications - No data to display  BP 110/65 mmHg  Pulse 83  Temp(Src) 98.1 F (36.7 C) (Oral)  Resp 16  SpO2 96% No data found.   Physical Exam  Constitutional: She is oriented to person, place, and time. She appears well-developed and well-nourished. No distress.  HENT:  Mouth/Throat: No oropharyngeal exudate.  Mild clear nasal drainage. Posterior oropharynx is mildly erythematous. TMs normal bilaterally.  Neck: Neck supple.  Cardiovascular: Normal rate, regular rhythm and normal heart sounds.   No murmur heard. Pulmonary/Chest: Effort normal and breath sounds normal. No respiratory distress. She has no wheezes. She has no rales.  Lymphadenopathy:    She has no cervical adenopathy.  Neurological: She is alert and oriented to person, place, and time.  Skin:  She has a large area of erythema with well demarcated borders in the gluteal cleft extending onto both buttocks.  She also has several erythematous papular lesions on her bilateral upper arms. There are overlying excoriations. No vesicular lesions.    ED Course  Procedures (including critical care time)  Labs Review Labs Reviewed - No data to display  Imaging Review No results found.   MDM   1. Rash   2. Candida infection   3. Acute URI    Triamcinolone cream for the rash. They are applying miconazole cream to the candidal rash. I recommended Diflucan weekly for 4 weeks. Watchful waiting  for upper respiratory symptoms. Over-the-counter Hall's as needed for cough or sore throat. Return precautions reviewed.    Charm Rings, MD 10/21/14 [phone]

## 2015-02-11 ENCOUNTER — Emergency Department (HOSPITAL_COMMUNITY): Payer: Medicare Other

## 2015-02-11 ENCOUNTER — Encounter (HOSPITAL_COMMUNITY): Payer: Self-pay | Admitting: *Deleted

## 2015-02-11 ENCOUNTER — Emergency Department (HOSPITAL_COMMUNITY)
Admission: EM | Admit: 2015-02-11 | Discharge: 2015-02-12 | Disposition: A | Discharge status: Home / Self Care | Payer: Medicare Other | Attending: Emergency Medicine | Admitting: Emergency Medicine

## 2015-02-11 DIAGNOSIS — E785 Hyperlipidemia, unspecified: Secondary | ICD-10-CM | POA: Insufficient documentation

## 2015-02-11 DIAGNOSIS — I2510 Atherosclerotic heart disease of native coronary artery without angina pectoris: Secondary | ICD-10-CM | POA: Diagnosis not present

## 2015-02-11 DIAGNOSIS — I10 Essential (primary) hypertension: Secondary | ICD-10-CM | POA: Insufficient documentation

## 2015-02-11 DIAGNOSIS — S50311A Abrasion of right elbow, initial encounter: Secondary | ICD-10-CM | POA: Insufficient documentation

## 2015-02-11 DIAGNOSIS — K219 Gastro-esophageal reflux disease without esophagitis: Secondary | ICD-10-CM | POA: Insufficient documentation

## 2015-02-11 DIAGNOSIS — M810 Age-related osteoporosis without current pathological fracture: Secondary | ICD-10-CM | POA: Diagnosis not present

## 2015-02-11 DIAGNOSIS — W010XXA Fall on same level from slipping, tripping and stumbling without subsequent striking against object, initial encounter: Secondary | ICD-10-CM | POA: Diagnosis not present

## 2015-02-11 DIAGNOSIS — Y9389 Activity, other specified: Secondary | ICD-10-CM | POA: Insufficient documentation

## 2015-02-11 DIAGNOSIS — F0390 Unspecified dementia without behavioral disturbance: Secondary | ICD-10-CM | POA: Insufficient documentation

## 2015-02-11 DIAGNOSIS — Z7982 Long term (current) use of aspirin: Secondary | ICD-10-CM | POA: Diagnosis not present

## 2015-02-11 DIAGNOSIS — Z8673 Personal history of transient ischemic attack (TIA), and cerebral infarction without residual deficits: Secondary | ICD-10-CM | POA: Diagnosis not present

## 2015-02-11 DIAGNOSIS — N390 Urinary tract infection, site not specified: Secondary | ICD-10-CM | POA: Diagnosis not present

## 2015-02-11 DIAGNOSIS — Z7951 Long term (current) use of inhaled steroids: Secondary | ICD-10-CM | POA: Diagnosis not present

## 2015-02-11 DIAGNOSIS — Z79899 Other long term (current) drug therapy: Secondary | ICD-10-CM | POA: Insufficient documentation

## 2015-02-11 DIAGNOSIS — S5001XA Contusion of right elbow, initial encounter: Secondary | ICD-10-CM | POA: Diagnosis not present

## 2015-02-11 DIAGNOSIS — Z8669 Personal history of other diseases of the nervous system and sense organs: Secondary | ICD-10-CM | POA: Diagnosis not present

## 2015-02-11 DIAGNOSIS — Y92129 Unspecified place in nursing home as the place of occurrence of the external cause: Secondary | ICD-10-CM | POA: Diagnosis not present

## 2015-02-11 DIAGNOSIS — J45909 Unspecified asthma, uncomplicated: Secondary | ICD-10-CM | POA: Diagnosis not present

## 2015-02-11 DIAGNOSIS — S59901A Unspecified injury of right elbow, initial encounter: Secondary | ICD-10-CM | POA: Diagnosis present

## 2015-02-11 DIAGNOSIS — Y998 Other external cause status: Secondary | ICD-10-CM | POA: Insufficient documentation

## 2015-02-11 DIAGNOSIS — W19XXXA Unspecified fall, initial encounter: Secondary | ICD-10-CM

## 2015-02-11 LAB — CBC WITH DIFFERENTIAL/PLATELET
BASOS PCT: 0 %
Basophils Absolute: 0 10*3/uL (ref 0.0–0.1)
EOS PCT: 2 %
Eosinophils Absolute: 0.1 10*3/uL (ref 0.0–0.7)
HEMATOCRIT: 42.1 % (ref 36.0–46.0)
Hemoglobin: 13.1 g/dL (ref 12.0–15.0)
Lymphocytes Relative: 33 %
Lymphs Abs: 2.6 10*3/uL (ref 0.7–4.0)
MCH: 29.2 pg (ref 26.0–34.0)
MCHC: 31.1 g/dL (ref 30.0–36.0)
MCV: 94 fL (ref 78.0–100.0)
MONO ABS: 0.6 10*3/uL (ref 0.1–1.0)
Monocytes Relative: 8 %
NEUTROS ABS: 4.6 10*3/uL (ref 1.7–7.7)
Neutrophils Relative %: 57 %
Platelets: 231 10*3/uL (ref 150–400)
RBC: 4.48 MIL/uL (ref 3.87–5.11)
RDW: 13.4 % (ref 11.5–15.5)
WBC: 8 10*3/uL (ref 4.0–10.5)

## 2015-02-11 LAB — I-STAT TROPONIN, ED
Troponin i, poc: 0.01 ng/mL (ref 0.00–0.08)
Troponin i, poc: 0.01 ng/mL (ref 0.00–0.08)

## 2015-02-11 LAB — URINALYSIS, ROUTINE W REFLEX MICROSCOPIC
Bilirubin Urine: NEGATIVE
Glucose, UA: NEGATIVE mg/dL
Ketones, ur: NEGATIVE mg/dL
Nitrite: NEGATIVE
Protein, ur: NEGATIVE mg/dL
SPECIFIC GRAVITY, URINE: 1.012 (ref 1.005–1.030)
pH: 6.5 (ref 5.0–8.0)

## 2015-02-11 LAB — COMPREHENSIVE METABOLIC PANEL
ALBUMIN: 3.8 g/dL (ref 3.5–5.0)
ALK PHOS: 72 U/L (ref 38–126)
ALT: 16 U/L (ref 14–54)
ANION GAP: 9 (ref 5–15)
AST: 24 U/L (ref 15–41)
BUN: 18 mg/dL (ref 6–20)
CO2: 26 mmol/L (ref 22–32)
Calcium: 9.4 mg/dL (ref 8.9–10.3)
Chloride: 105 mmol/L (ref 101–111)
Creatinine, Ser: 0.79 mg/dL (ref 0.44–1.00)
GFR calc Af Amer: >60 mL/min (ref >60)
GFR calc non Af Amer: >60 mL/min (ref >60)
Glucose, Bld: 107 mg/dL — ABNORMAL HIGH (ref 65–99)
Potassium: 4.1 mmol/L (ref 3.5–5.1)
SODIUM: 140 mmol/L (ref 135–145)
Total Bilirubin: 0.3 mg/dL (ref 0.3–1.2)
Total Protein: 6.9 g/dL (ref 6.5–8.1)

## 2015-02-11 LAB — URINE MICROSCOPIC-ADD ON: Squamous Epithelial / LPF: NONE SEEN

## 2015-02-11 MED ORDER — NITROFURANTOIN MONOHYD MACRO 100 MG PO CAPS: 100.0000 mg | ORAL_CAPSULE | Freq: Two times a day (BID) | ORAL | Status: DC

## 2015-02-11 NOTE — ED Notes (Signed)
Bed: WA09 Expected date:  Expected time:  Means of arrival:  Comments: EMS- 82yo F, fall/elbow pain/recent surgery

## 2015-02-11 NOTE — ED Notes (Signed)
Per EMS report: pt from [facility] [address]. Pt was trying to open the door and tripped and fell.  The fall was unwitnessed.  Pt denies hitting head or losing consciousness.  Pt has abrasion on the right elbow.   Pt hx of dementia. Pt a/o x 3. Pt not on any blood thinners.

## 2015-02-11 NOTE — ED Provider Notes (Signed)
CSN: [id number]     Arrival date & time 02/11/15  1757 History   First MD Initiated Contact with Patient 02/11/15 1828     Chief Complaint  Patient presents with  . Fall    HPI  [name] is an 82 y.o. female with history of dementia, HTN, HLD, CAD, GERD, h/o stroke who presents to the ED for evaluation following unwitnessed fall at her living facility [facility]). History limited due to dementia. Per EMS, pt was opening a door and tripped and fell. The episode was unwitnessed. In the ED now she does not know what happened, does not know why she is here. States she was told she fell. She has an abrasion to her right elbow and states her right elbow hurts, otherwise has no complaints. Denies current chest pain, SOB, neck pain, headache, dizziness, blurred vision. She is alert and oriented to person and place, though states the year is 2000. It is unclear what her baseline mentation is.    Past Medical History  Diagnosis Date  . HYPERLIPIDEMIA 12/20/2006  . Senile dementia, uncomplicated 01/06/2009  . INSOMNIA-SLEEP DISORDER-UNSPEC 11/09/2006  . BLURRED VISION 06/29/2008  . HYPERTENSION 11/09/2006  . CORONARY ARTERY DISEASE 07/24/2006  . ASTHMATIC BRONCHITIS, ACUTE 01/14/2007  . ALLERGIC RHINITIS 11/09/2006  . GERD 06/04/2006  . CONSTIPATION 03/01/2009  . BACK PAIN 03/01/2009  . OSTEOPOROSIS 10/13/2009  . FATIGUE 01/21/2008  . CHEST PAIN 09/12/2007  . CEREBROVASCULAR ACCIDENT, HX OF 07/24/2006  . Goiter, unspecified 02/01/2010  . Cough 02/01/2010  . Asthma   . Chronic cough   . Stroke (HCC)   . Degenerative disc disease    Past Surgical History  Procedure Laterality Date  . Dilation and curettage of uterus     Family History  Problem Relation Age of Onset  . Coronary artery disease Other    Social History  Substance Use Topics  . Smoking status: Never Smoker   . Smokeless tobacco: Never Used  . Alcohol Use: No   OB History    No data available     Review of Systems  Unable to perform  ROS: Dementia      Allergies  Metoclopramide hcl and Simvastatin  Home Medications   Prior to Admission medications   Medication Sig Start Date End Date Taking? Authorizing Provider  acetaminophen (TYLENOL) 325 MG tablet Take 650 mg by mouth every 6 (six) hours as needed.   Yes Historical Provider, MD  Albuterol Sulfate (PROAIR HFA IN) Inhale 2 puffs into the lungs every 4 (four) hours as needed (shortness of breath and wheezing).    Yes Historical Provider, MD  aspirin 81 MG chewable tablet Chew 81 mg by mouth daily.    Yes Historical Provider, MD  Calcium Carbonate-Vitamin D (CALCIUM 600/VITAMIN D) 600-400 MG-UNIT per tablet Take 1 tablet by mouth 2 (two) times daily.   Yes Historical Provider, MD  donepezil (ARICEPT) 10 MG tablet Take 10 mg by mouth at bedtime. 01/30/11  Yes Corwin Levins, MD  escitalopram (LEXAPRO) 10 MG tablet Take 10 mg by mouth daily.   Yes Historical Provider, MD  Fluticasone Furoate-Vilanterol (BREO ELLIPTA) 100-25 MCG/INH AEPB Inhale 1 puff into the lungs daily.   Yes Historical Provider, MD  miconazole (MICOTIN) 2 % cream Apply 1 application topically 3 (three) times daily.   Yes Historical Provider, MD  montelukast (SINGULAIR) 10 MG tablet Take 10 mg by mouth daily.  01/29/13  Yes Historical Provider, MD  Multiple Vitamin (MULTIVITAMIN WITH MINERALS) TABS  tablet Take 1 tablet by mouth daily.   Yes Historical Provider, MD  omeprazole (PRILOSEC) 40 MG capsule Take 40 mg by mouth daily.   Yes Historical Provider, MD  OVER THE COUNTER MEDICATION Take 1 Bottle by mouth 2 (two) times daily. House nourishment   Yes Historical Provider, MD  pravastatin (PRAVACHOL) 40 MG tablet Take 40 mg by mouth at bedtime.    Yes Historical Provider, MD  senna-docusate (SENOKOT-S) 8.6-50 MG per tablet Take 2 tablets by mouth at bedtime.   Yes Historical Provider, MD  Skin Protectants, Misc. (EUCERIN) cream Apply 1 application topically every evening.   Yes Historical Provider, MD   BP  168/85 mmHg  Pulse 66  Temp(Src) 98.2 F (36.8 C) (Oral)  Resp 18  SpO2 98% Physical Exam  Constitutional: She is oriented to person, place, and time.  HENT:  Head: Atraumatic.  Right Ear: External ear normal.  Left Ear: External ear normal.  Nose: Nose normal.  Mouth/Throat: Oropharynx is clear and moist. No oropharyngeal exudate.  Eyes: Conjunctivae and EOM are normal. Pupils are equal, round, and reactive to light.  Neck: Normal range of motion. Neck supple.  Cardiovascular: Normal rate, regular rhythm, normal heart sounds and intact distal pulses.   Pulmonary/Chest: Effort normal and breath sounds normal. No respiratory distress. She has no wheezes. She exhibits no tenderness.  Abdominal: Soft. Bowel sounds are normal. She exhibits no distension. There is no tenderness. There is no rebound and no guarding.  Musculoskeletal: Normal range of motion. She exhibits no edema.  Right elbow with superficial abrasion and mild bruising. FROM. Bilateral UE and LE strength and sensation intact.  No c-spine, t-spine, l-spine tenderness.   Neurological: She is alert and oriented to person, place, and time. No cranial nerve deficit.  Skin: Skin is warm and dry.  Psychiatric: She has a normal mood and affect.  Nursing note and vitals reviewed.   ED Course  Procedures (including critical care time) Labs Review Labs Reviewed  COMPREHENSIVE METABOLIC PANEL - Abnormal; Notable for the following:    Glucose, Bld 107 (*)    All other components within normal limits  URINALYSIS, ROUTINE W REFLEX MICROSCOPIC (NOT AT [facility]) - Abnormal; Notable for the following:    APPearance CLOUDY (*)    Hgb urine dipstick TRACE (*)    Leukocytes, UA LARGE (*)    All other components within normal limits  URINE MICROSCOPIC-ADD ON - Abnormal; Notable for the following:    Bacteria, UA MANY (*)    All other components within normal limits  URINE CULTURE  CBC WITH DIFFERENTIAL/PLATELET  Rosezena Sensor, ED   Rosezena Sensor, ED    Imaging Review Dg Chest 2 View  02/11/2015  CLINICAL DATA:  Patient from the nursing home. Patient tripped and fell. Fall was unwitnessed. History of dementia and hypertension. History of bronchitis. EXAM: CHEST  2 VIEW COMPARISON:  11/19/2013 FINDINGS: There changes from prior CABG surgery. Cardiac silhouette is mildly enlarged. No mediastinal or hilar masses or evidence of adenopathy. Clear lungs.  No pleural effusion or pneumothorax. Bony thorax is demineralized but grossly intact. IMPRESSION: No acute cardiopulmonary disease. Electronically Signed   By: Amie Portland M.D.   On: 02/11/2015 19:57   Dg Elbow Complete Right  02/11/2015  CLINICAL DATA:  Fall, abrasion on the right elbow. EXAM: RIGHT ELBOW - COMPLETE 3+ VIEW COMPARISON:  None. FINDINGS: Upper normal sized anterior fat pad. No posterior fat pad visible. Supinator fat pad normal. I do not observe  a fracture. There is likely some mild soft tissue swelling overlying the olecranon. IMPRESSION: 1. Mild soft tissue swelling overlying the olecranon. No fracture or definite elbow joint effusion. Electronically Signed   By: Gaylyn Rong M.D.   On: 02/11/2015 19:56   Ct Head Wo Contrast  02/11/2015  CLINICAL DATA:  Status post unwitnessed fall.  History of dementia. EXAM: CT HEAD WITHOUT CONTRAST CT CERVICAL SPINE WITHOUT CONTRAST TECHNIQUE: Multidetector CT imaging of the head and cervical spine was performed following the standard protocol without intravenous contrast. Multiplanar CT image reconstructions of the cervical spine were also generated. COMPARISON:  CT of the head 10/13/2013 FINDINGS: CT HEAD FINDINGS No mass effect or midline shift. No evidence of acute intracranial hemorrhage, or infarction. No abnormal extra-axial fluid collections. There is moderate brain parenchymal atrophy and chronic small vessel disease changes. Basal cisterns are preserved. Vascular calcifications are noted at the skullbase. No  depressed skull fractures. Visualized paranasal sinuses and mastoid air cells are not opacified. CT CERVICAL SPINE FINDINGS There is no evidence for acute fracture or dislocation. There are multilevel osteoarthritic changes of the cervical spine. Minimal anterolisthesis of C3 on C4 is seen, likely due to severe left sided facet arthropathy. Prevertebral soft tissues have a normal appearance. Lung apices have a normal appearance. IMPRESSION: No evidence of acute traumatic injury to the head or cervical spine. Moderate brain parenchymal atrophy and microvascular disease. Moderate multilevel osteoarthritic changes of the cervical spine, with minimal anterolisthesis of C3 on C4. Electronically Signed   By: Ted Mcalpine M.D.   On: 02/11/2015 20:16   Ct Cervical Spine Wo Contrast  02/11/2015  CLINICAL DATA:  Status post unwitnessed fall.  History of dementia. EXAM: CT HEAD WITHOUT CONTRAST CT CERVICAL SPINE WITHOUT CONTRAST TECHNIQUE: Multidetector CT imaging of the head and cervical spine was performed following the standard protocol without intravenous contrast. Multiplanar CT image reconstructions of the cervical spine were also generated. COMPARISON:  CT of the head 10/13/2013 FINDINGS: CT HEAD FINDINGS No mass effect or midline shift. No evidence of acute intracranial hemorrhage, or infarction. No abnormal extra-axial fluid collections. There is moderate brain parenchymal atrophy and chronic small vessel disease changes. Basal cisterns are preserved. Vascular calcifications are noted at the skullbase. No depressed skull fractures. Visualized paranasal sinuses and mastoid air cells are not opacified. CT CERVICAL SPINE FINDINGS There is no evidence for acute fracture or dislocation. There are multilevel osteoarthritic changes of the cervical spine. Minimal anterolisthesis of C3 on C4 is seen, likely due to severe left sided facet arthropathy. Prevertebral soft tissues have a normal appearance. Lung apices have  a normal appearance. IMPRESSION: No evidence of acute traumatic injury to the head or cervical spine. Moderate brain parenchymal atrophy and microvascular disease. Moderate multilevel osteoarthritic changes of the cervical spine, with minimal anterolisthesis of C3 on C4. Electronically Signed   By: Ted Mcalpine M.D.   On: 02/11/2015 20:16   I have personally reviewed and evaluated these images and lab results as part of my medical decision-making.   EKG Interpretation None      MDM   Final diagnoses:  Fall, initial encounter  Contusion of right elbow, initial encounter  Urinary tract infection without hematuria, site unspecified    CT head, CT c-spine, CXR, XR right elbow unremarkable.  Will place dressing on abrasion to elbow. UA shows evidence of infection. Pt denies abdominal or urinary complaints at this time. However, as it is unclear what her baseline mentation is and I am  unable to get in touch with representative from [facility], will give rx for Macrobid and send urine for culture. Labs otherwise unremarkable. No other visible or palpable injury or deformity. Will d/c home with instructions for PCP f/u and strict ER return precautions.     Carlene Coria, PA-C 02/12/15 1608  Nelva Nay, MD 02/13/15 1040

## 2015-02-11 NOTE — Discharge Instructions (Signed)
You were seen in the emergency today for evaluation after a fall. You have a contusion (or bruising) of your right elbow. You may keep the area covered with a bandage as it heals. Otherwise CT scans and X-rays were normal. Your urine did show evidence of infection. I will give you a prescription for antibiotics. Please follow up with your primary care provider within one week. Return to the ER for new or worsening symptoms.   Elbow Contusion An elbow contusion is a deep bruise of the elbow. Contusions are the result of an injury that caused bleeding under the skin. The contusion may turn blue, purple, or yellow. Minor injuries will give you a painless contusion, but more severe contusions may stay painful and swollen for a few weeks.  CAUSES  An elbow contusion comes from a direct force to that area, such as falling on the elbow. SYMPTOMS   Swelling and redness of the elbow.  Bruising of the elbow area.  Tenderness or soreness of the elbow. DIAGNOSIS  You will have a physical exam and will be asked about your history. You may need an X-ray of your elbow to look for a broken bone (fracture).  TREATMENT  A sling or splint may be needed to support your injury. Resting, elevating, and applying cold compresses to the elbow area are often the best treatments for an elbow contusion. Over-the-counter medicines may also be recommended for pain control. HOME CARE INSTRUCTIONS   Put ice on the injured area.  Put ice in a plastic bag.  Place a towel between your skin and the bag.  Leave the ice on for 15-20 minutes, 03-04 times a day.  Only take over-the-counter or prescription medicines for pain, discomfort, or fever as directed by your caregiver.  Rest your injured elbow until the pain and swelling are better.  Elevate your elbow to reduce swelling.  Apply a compression wrap as directed by your caregiver. This can help reduce swelling and motion. You may remove the wrap for sleeping, showers,  and baths. If your fingers become numb, cold, or blue, take the wrap off and reapply it more loosely.  Use your elbow only as directed by your caregiver. You may be asked to do range of motion exercises. Do them as directed.  See your caregiver as directed. It is very important to keep all follow-up appointments in order to avoid any long-term problems with your elbow, including chronic pain or inability to move your elbow normally. SEEK IMMEDIATE MEDICAL CARE IF:   You have increased redness, swelling, or pain in your elbow.  Your swelling or pain is not relieved with medicines.  You have swelling of the hand and fingers.  You are unable to move your fingers or wrist.  You begin to lose feeling in your hand or fingers.  Your fingers or hand become cold or blue. MAKE SURE YOU:   Understand these instructions.  Will watch your condition.  Will get help right away if you are not doing well or get worse.   This information is not intended to replace advice given to you by your health care provider. Make sure you discuss any questions you have with your health care provider.   Document Released: 11/27/2005 Document Revised: 03/13/2011 Document Reviewed: 08/03/2014 Elsevier Interactive Patient Education [employer]

## 2015-02-14 LAB — URINE CULTURE

## 2015-02-15 ENCOUNTER — Telehealth (HOSPITAL_COMMUNITY): Payer: Self-pay

## 2015-02-15 NOTE — Telephone Encounter (Signed)
Post ED Visit - Positive Culture Follow-up  Culture report reviewed by antimicrobial stewardship pharmacist:   Enzo Bi, Pharm.D.  Celedonio Miyamoto, Pharm.D., BCPS  Garvin Fila, Pharm.D.  Georgina Pillion, Pharm.D., BCPS  [place], [place].D., BCPS, AAHIVP  Estella Husk, Pharm.D., BCPS, AAHIVP  Tennis Must, Pharm.D.  Sherle Poe, [address].D.  Positive urine culture, >/= 100,000 colonies -> E Coli Treated with Nitrofurantoin, organism sensitive to the same and no further patient follow-up is required at this time.  Arvid Right 02/15/2015, 9:13 PM

## 2015-02-25 ENCOUNTER — Emergency Department (HOSPITAL_COMMUNITY): Payer: Medicare Other

## 2015-02-25 ENCOUNTER — Encounter (HOSPITAL_COMMUNITY): Payer: Self-pay

## 2015-02-25 ENCOUNTER — Inpatient Hospital Stay (HOSPITAL_COMMUNITY)
Admission: EM | Admit: 2015-02-25 | Discharge: 2015-03-01 | DRG: 690 | Disposition: A | Discharge status: Nursing Home (Includes ICF) | Payer: Medicare Other | Attending: Internal Medicine | Admitting: Internal Medicine

## 2015-02-25 DIAGNOSIS — Z8673 Personal history of transient ischemic attack (TIA), and cerebral infarction without residual deficits: Secondary | ICD-10-CM

## 2015-02-25 DIAGNOSIS — R6510 Systemic inflammatory response syndrome (SIRS) of non-infectious origin without acute organ dysfunction: Secondary | ICD-10-CM

## 2015-02-25 DIAGNOSIS — Y92122 Bedroom in nursing home as the place of occurrence of the external cause: Secondary | ICD-10-CM

## 2015-02-25 DIAGNOSIS — D638 Anemia in other chronic diseases classified elsewhere: Secondary | ICD-10-CM | POA: Diagnosis present

## 2015-02-25 DIAGNOSIS — R197 Diarrhea, unspecified: Secondary | ICD-10-CM | POA: Diagnosis present

## 2015-02-25 DIAGNOSIS — S30810A Abrasion of lower back and pelvis, initial encounter: Secondary | ICD-10-CM | POA: Diagnosis present

## 2015-02-25 DIAGNOSIS — Z8249 Family history of ischemic heart disease and other diseases of the circulatory system: Secondary | ICD-10-CM

## 2015-02-25 DIAGNOSIS — E785 Hyperlipidemia, unspecified: Secondary | ICD-10-CM | POA: Diagnosis present

## 2015-02-25 DIAGNOSIS — IMO0002 Reserved for concepts with insufficient information to code with codable children: Secondary | ICD-10-CM

## 2015-02-25 DIAGNOSIS — T68XXXA Hypothermia, initial encounter: Secondary | ICD-10-CM

## 2015-02-25 DIAGNOSIS — E876 Hypokalemia: Secondary | ICD-10-CM | POA: Diagnosis present

## 2015-02-25 DIAGNOSIS — I959 Hypotension, unspecified: Secondary | ICD-10-CM | POA: Diagnosis present

## 2015-02-25 DIAGNOSIS — I10 Essential (primary) hypertension: Secondary | ICD-10-CM | POA: Diagnosis present

## 2015-02-25 DIAGNOSIS — S0181XA Laceration without foreign body of other part of head, initial encounter: Secondary | ICD-10-CM | POA: Diagnosis present

## 2015-02-25 DIAGNOSIS — F0390 Unspecified dementia without behavioral disturbance: Secondary | ICD-10-CM | POA: Diagnosis present

## 2015-02-25 DIAGNOSIS — W19XXXA Unspecified fall, initial encounter: Secondary | ICD-10-CM | POA: Diagnosis not present

## 2015-02-25 DIAGNOSIS — J45909 Unspecified asthma, uncomplicated: Secondary | ICD-10-CM | POA: Diagnosis present

## 2015-02-25 DIAGNOSIS — Z9181 History of falling: Secondary | ICD-10-CM

## 2015-02-25 DIAGNOSIS — S0181XS Laceration without foreign body of other part of head, sequela: Secondary | ICD-10-CM | POA: Diagnosis not present

## 2015-02-25 DIAGNOSIS — W1830XA Fall on same level, unspecified, initial encounter: Secondary | ICD-10-CM | POA: Diagnosis present

## 2015-02-25 DIAGNOSIS — N390 Urinary tract infection, site not specified: Secondary | ICD-10-CM | POA: Diagnosis present

## 2015-02-25 DIAGNOSIS — A419 Sepsis, unspecified organism: Secondary | ICD-10-CM

## 2015-02-25 DIAGNOSIS — Z66 Do not resuscitate: Secondary | ICD-10-CM | POA: Diagnosis present

## 2015-02-25 DIAGNOSIS — R296 Repeated falls: Secondary | ICD-10-CM | POA: Diagnosis present

## 2015-02-25 DIAGNOSIS — D62 Acute posthemorrhagic anemia: Secondary | ICD-10-CM | POA: Diagnosis present

## 2015-02-25 DIAGNOSIS — I2510 Atherosclerotic heart disease of native coronary artery without angina pectoris: Secondary | ICD-10-CM | POA: Diagnosis present

## 2015-02-25 DIAGNOSIS — K219 Gastro-esophageal reflux disease without esophagitis: Secondary | ICD-10-CM | POA: Diagnosis present

## 2015-02-25 DIAGNOSIS — W19XXXS Unspecified fall, sequela: Secondary | ICD-10-CM | POA: Diagnosis not present

## 2015-02-25 DIAGNOSIS — Z888 Allergy status to other drugs, medicaments and biological substances status: Secondary | ICD-10-CM | POA: Diagnosis not present

## 2015-02-25 DIAGNOSIS — S0990XA Unspecified injury of head, initial encounter: Secondary | ICD-10-CM

## 2015-02-25 LAB — CBC WITH DIFFERENTIAL/PLATELET
Basophils Absolute: 0 10*3/uL (ref 0.0–0.1)
Basophils Absolute: 0 10*3/uL (ref 0.0–0.1)
Basophils Relative: 0 %
Basophils Relative: 0 %
EOS ABS: 0.1 10*3/uL (ref 0.0–0.7)
EOS ABS: 0 10*3/uL (ref 0.0–0.7)
Eosinophils Relative: 1 %
Eosinophils Relative: 0 %
HEMATOCRIT: 38.4 % (ref 36.0–46.0)
HEMATOCRIT: 29.1 % — AB (ref 36.0–46.0)
HEMOGLOBIN: 12.1 g/dL (ref 12.0–15.0)
HEMOGLOBIN: 9.3 g/dL — AB (ref 12.0–15.0)
LYMPHS ABS: 2.7 10*3/uL (ref 0.7–4.0)
LYMPHS ABS: 1.8 10*3/uL (ref 0.7–4.0)
Lymphocytes Relative: 23 %
Lymphocytes Relative: 20 %
MCH: 29.7 pg (ref 26.0–34.0)
MCH: 29.8 pg (ref 26.0–34.0)
MCHC: 31.5 g/dL (ref 30.0–36.0)
MCHC: 32 g/dL (ref 30.0–36.0)
MCV: 94.3 fL (ref 78.0–100.0)
MCV: 93.3 fL (ref 78.0–100.0)
MONOS PCT: 5 %
MONOS PCT: 9 %
Monocytes Absolute: 0.6 10*3/uL (ref 0.1–1.0)
Monocytes Absolute: 0.8 10*3/uL (ref 0.1–1.0)
NEUTROS ABS: 8.2 10*3/uL — AB (ref 1.7–7.7)
NEUTROS ABS: 6.2 10*3/uL (ref 1.7–7.7)
NEUTROS PCT: 71 %
NEUTROS PCT: 70 %
Platelets: 263 10*3/uL (ref 150–400)
Platelets: 170 10*3/uL (ref 150–400)
RBC: 4.07 MIL/uL (ref 3.87–5.11)
RBC: 3.12 MIL/uL — ABNORMAL LOW (ref 3.87–5.11)
RDW: 13.7 % (ref 11.5–15.5)
RDW: 13.8 % (ref 11.5–15.5)
WBC: 11.6 10*3/uL — AB (ref 4.0–10.5)
WBC: 8.9 10*3/uL (ref 4.0–10.5)

## 2015-02-25 LAB — URINALYSIS, ROUTINE W REFLEX MICROSCOPIC
BILIRUBIN URINE: NEGATIVE
GLUCOSE, UA: NEGATIVE mg/dL
HGB URINE DIPSTICK: NEGATIVE
KETONES UR: NEGATIVE mg/dL
Nitrite: NEGATIVE
PH: 6 (ref 5.0–8.0)
Protein, ur: NEGATIVE mg/dL
Specific Gravity, Urine: 1.023 (ref 1.005–1.030)

## 2015-02-25 LAB — PROTIME-INR
INR: 1.08 (ref 0.00–1.49)
PROTHROMBIN TIME: 14.2 s (ref 11.6–15.2)

## 2015-02-25 LAB — COMPREHENSIVE METABOLIC PANEL
ALBUMIN: 3.8 g/dL (ref 3.5–5.0)
ALK PHOS: 74 U/L (ref 38–126)
ALT: 15 U/L (ref 14–54)
AST: 25 U/L (ref 15–41)
Anion gap: 9 (ref 5–15)
BILIRUBIN TOTAL: 0.4 mg/dL (ref 0.3–1.2)
BUN: 18 mg/dL (ref 6–20)
CALCIUM: 9.5 mg/dL (ref 8.9–10.3)
CO2: 26 mmol/L (ref 22–32)
CREATININE: 0.93 mg/dL (ref 0.44–1.00)
Chloride: 104 mmol/L (ref 101–111)
GFR calc Af Amer: >60 mL/min (ref >60)
GFR calc non Af Amer: 56 mL/min — ABNORMAL LOW (ref >60)
GLUCOSE: 190 mg/dL — AB (ref 65–99)
Potassium: 3.9 mmol/L (ref 3.5–5.1)
SODIUM: 139 mmol/L (ref 135–145)
TOTAL PROTEIN: 6.7 g/dL (ref 6.5–8.1)

## 2015-02-25 LAB — URINE MICROSCOPIC-ADD ON

## 2015-02-25 LAB — TYPE AND SCREEN
ABO/RH(D): O POS
Antibody Screen: NEGATIVE

## 2015-02-25 LAB — PROCALCITONIN: Procalcitonin: <0.1 ng/mL

## 2015-02-25 LAB — I-STAT CG4 LACTIC ACID, ED
LACTIC ACID, VENOUS: 1.89 mmol/L (ref 0.5–2.0)
Lactic Acid, Venous: 3.64 mmol/L (ref 0.5–2.0)

## 2015-02-25 LAB — MRSA PCR SCREENING: MRSA by PCR: POSITIVE — AB

## 2015-02-25 LAB — ABO/RH: ABO/RH(D): O POS

## 2015-02-25 LAB — LACTIC ACID, PLASMA
Lactic Acid, Venous: 1.9 mmol/L (ref 0.5–2.0)
Lactic Acid, Venous: 1.3 mmol/L (ref 0.5–2.0)

## 2015-02-25 LAB — CK: Total CK: 52 U/L (ref 38–234)

## 2015-02-25 MED ORDER — PANTOPRAZOLE SODIUM 40 MG PO TBEC
40.0000 mg | DELAYED_RELEASE_TABLET | Freq: Every day | ORAL | Status: DC
  Administered 2015-02-25 – 2015-03-01 (×5): 40 mg via ORAL
  Filled 2015-02-25 (×5): qty 1

## 2015-02-25 MED ORDER — FLUTICASONE FUROATE-VILANTEROL 100-25 MCG/INH IN AEPB
1.0000 | INHALATION_SPRAY | Freq: Every day | RESPIRATORY_TRACT | Status: DC
  Administered 2015-02-25 – 2015-03-01 (×4): 1 via RESPIRATORY_TRACT
  Filled 2015-02-25: qty 28

## 2015-02-25 MED ORDER — BUPIVACAINE HCL (PF) 0.25 % IJ SOLN
20.0000 mL | Freq: Once | INTRAMUSCULAR | Status: AC
  Administered 2015-02-25: 20 mL
  Filled 2015-02-25: qty 30

## 2015-02-25 MED ORDER — ESCITALOPRAM OXALATE 10 MG PO TABS
10.0000 mg | ORAL_TABLET | Freq: Every day | ORAL | Status: DC
  Administered 2015-02-25 – 2015-03-01 (×5): 10 mg via ORAL
  Filled 2015-02-25 (×5): qty 1

## 2015-02-25 MED ORDER — DONEPEZIL HCL 10 MG PO TABS
10.0000 mg | ORAL_TABLET | Freq: Every day | ORAL | Status: DC
  Administered 2015-02-26 (×2): 10 mg via ORAL
  Filled 2015-02-25 (×3): qty 1

## 2015-02-25 MED ORDER — VANCOMYCIN HCL IN DEXTROSE 1-5 GM/200ML-% IV SOLN
1000.0000 mg | Freq: Once | INTRAVENOUS | Status: AC
  Administered 2015-02-25: 1000 mg via INTRAVENOUS
  Filled 2015-02-25: qty 200

## 2015-02-25 MED ORDER — SENNOSIDES-DOCUSATE SODIUM 8.6-50 MG PO TABS
2.0000 | ORAL_TABLET | Freq: Every day | ORAL | Status: DC
  Administered 2015-02-26 (×2): 2 via ORAL
  Filled 2015-02-25 (×2): qty 2

## 2015-02-25 MED ORDER — SODIUM CHLORIDE 0.9 % IV BOLUS (SEPSIS)
500.0000 mL | Freq: Once | INTRAVENOUS | Status: AC
  Administered 2015-02-25: 500 mL via INTRAVENOUS

## 2015-02-25 MED ORDER — ACETAMINOPHEN 650 MG RE SUPP: 650.0000 mg | Freq: Four times a day (QID) | RECTAL | Status: DC | PRN

## 2015-02-25 MED ORDER — ADULT MULTIVITAMIN W/MINERALS CH
1.0000 | ORAL_TABLET | Freq: Every day | ORAL | Status: DC
  Administered 2015-02-25: 1 via ORAL
  Filled 2015-02-25: qty 1

## 2015-02-25 MED ORDER — ONDANSETRON HCL 4 MG/2ML IJ SOLN
4.0000 mg | Freq: Once | INTRAMUSCULAR | Status: AC
  Administered 2015-02-25: 4 mg via INTRAVENOUS
  Filled 2015-02-25: qty 2

## 2015-02-25 MED ORDER — PIPERACILLIN-TAZOBACTAM 3.375 G IVPB
3.3750 g | Freq: Three times a day (TID) | INTRAVENOUS | Status: DC
  Administered 2015-02-25 – 2015-02-27 (×6): 3.375 g via INTRAVENOUS
  Filled 2015-02-25 (×8): qty 50

## 2015-02-25 MED ORDER — ACETAMINOPHEN 325 MG PO TABS
650.0000 mg | ORAL_TABLET | Freq: Four times a day (QID) | ORAL | Status: DC | PRN
  Administered 2015-02-27: 650 mg via ORAL
  Filled 2015-02-25: qty 2

## 2015-02-25 MED ORDER — CHLORHEXIDINE GLUCONATE CLOTH 2 % EX PADS: 6.0000 | MEDICATED_PAD | Freq: Every day | CUTANEOUS | Status: DC

## 2015-02-25 MED ORDER — VANCOMYCIN HCL 500 MG IV SOLR
500.0000 mg | Freq: Two times a day (BID) | INTRAVENOUS | Status: DC
  Administered 2015-02-25 – 2015-02-27 (×4): 500 mg via INTRAVENOUS
  Filled 2015-02-25 (×6): qty 500

## 2015-02-25 MED ORDER — PRAVASTATIN SODIUM 20 MG PO TABS
40.0000 mg | ORAL_TABLET | Freq: Every day | ORAL | Status: DC
  Administered 2015-02-26: 40 mg via ORAL
  Filled 2015-02-25: qty 2

## 2015-02-25 MED ORDER — ASPIRIN 81 MG PO CHEW
81.0000 mg | CHEWABLE_TABLET | Freq: Every day | ORAL | Status: DC
  Administered 2015-02-25 – 2015-03-01 (×5): 81 mg via ORAL
  Filled 2015-02-25 (×5): qty 1

## 2015-02-25 MED ORDER — ENOXAPARIN SODIUM 40 MG/0.4ML ~~LOC~~ SOLN
40.0000 mg | SUBCUTANEOUS | Status: DC
  Administered 2015-02-25 – 2015-02-26 (×2): 40 mg via SUBCUTANEOUS
  Filled 2015-02-25 (×3): qty 0.4

## 2015-02-25 MED ORDER — MONTELUKAST SODIUM 10 MG PO TABS
10.0000 mg | ORAL_TABLET | Freq: Every day | ORAL | Status: DC
  Administered 2015-02-25 – 2015-03-01 (×5): 10 mg via ORAL
  Filled 2015-02-25 (×5): qty 1

## 2015-02-25 MED ORDER — ONDANSETRON HCL 4 MG PO TABS: 4.0000 mg | ORAL_TABLET | Freq: Four times a day (QID) | ORAL | Status: DC | PRN

## 2015-02-25 MED ORDER — ONDANSETRON HCL 4 MG/2ML IJ SOLN: 4.0000 mg | Freq: Four times a day (QID) | INTRAMUSCULAR | Status: DC | PRN

## 2015-02-25 MED ORDER — SODIUM CHLORIDE 0.9 % IV BOLUS (SEPSIS)
1000.0000 mL | INTRAVENOUS | Status: AC
  Administered 2015-02-25 (×2): 1000 mL via INTRAVENOUS

## 2015-02-25 MED ORDER — MUPIROCIN 2 % EX OINT
1.0000 "application " | TOPICAL_OINTMENT | Freq: Two times a day (BID) | CUTANEOUS | Status: DC
  Administered 2015-02-26: 1 via NASAL
  Filled 2015-02-25: qty 22

## 2015-02-25 MED ORDER — SODIUM CHLORIDE 0.9 % IV SOLN
INTRAVENOUS | Status: DC
  Administered 2015-02-25: 14:00:00 via INTRAVENOUS
  Administered 2015-02-26: 1000 mL via INTRAVENOUS

## 2015-02-25 MED ORDER — SODIUM CHLORIDE 0.9% FLUSH
3.0000 mL | Freq: Two times a day (BID) | INTRAVENOUS | Status: DC
  Administered 2015-02-25 – 2015-03-01 (×5): 3 mL via INTRAVENOUS

## 2015-02-25 MED ORDER — PIPERACILLIN-TAZOBACTAM 3.375 G IVPB 30 MIN
3.3750 g | Freq: Once | INTRAVENOUS | Status: AC
  Administered 2015-02-25: 3.375 g via INTRAVENOUS
  Filled 2015-02-25: qty 50

## 2015-02-25 NOTE — ED Notes (Signed)
Bed: ZO10 Expected date:  Expected time:  Means of arrival:  Comments: EMS head injury

## 2015-02-25 NOTE — ED Notes (Signed)
EKG given to EDP,Kohut,MD., for review. 

## 2015-02-25 NOTE — Care Management Note (Signed)
Case Management Note  Patient Details  Name: [name] MRN: [id number] Date of Birth: [birth year]  Subjective/Objective:            Fall with head injury-laceration/hypotensive        Action/Plan:Date: February 25, 2015 Chart reviewed for concurrent status and case management needs. Will continue to follow patient for changes and needs: Marcelle Smiling, BSN, RN, [place]   [phone]   Expected Discharge Date:   (UNKNOWN)               Expected Discharge Plan:  Skilled Nursing Facility  In-House Referral:  Clinical Social Work  Discharge planning Services  CM Consult  Post Acute Care Choice:  NA Choice offered to:  NA  DME Arranged:    DME Agency:     HH Arranged:    HH Agency:     Status of Service:  In process, will continue to follow  Medicare Important Message Given:    Date Medicare IM Given:    Medicare IM give by:    Date Additional Medicare IM Given:    Additional Medicare Important Message give by:     If discussed at Long Length of Stay Meetings, dates discussed:    Additional Comments:  Golda Acre, RN 02/25/2015, 3:09 PM

## 2015-02-25 NOTE — H&P (Addendum)
[facility] History and Physical  ROLENA KNUTSON XNA:[id number] DOB: [birth year] DOA: 02/25/2015   PCP: Cathlean Cower, MD  Specialists: Unknown  Chief Complaint: Fall with head injury  HPI: [name] is a 82 y.o. female with a past medical history of dementia, hypertension, asthma who has had multiple visits to the emergency department for falls at her memory care unit. She was apparently found on the floor this morning by the nursing staff. She was bleeding from her head. EMS was called and the patient was brought into the hospital. It is unclear if the patient had a syncopal episode or not. Patient has dementia and unable to provide any history. She does not remember the events of last night. She denies any chest pain, shortness of breath, nausea, vomiting. She does have some pain in the area of the head where she has the laceration.  In the emergency department patient underwent imaging studies, which did not show any fractures or intracranial bleed. Her right forehead laceration was sutured. UA was found to be abnormal. She had low BP. She had an elevated lactic acid level. She was thought to have sepsis and was hospitalized for further management.  Home Medications: Prior to Admission medications   Medication Sig Start Date End Date Taking? Authorizing Provider  acetaminophen (TYLENOL) 325 MG tablet Take 650 mg by mouth every 6 (six) hours as needed.   Yes Historical Provider, MD  Albuterol Sulfate (PROAIR HFA IN) Inhale 2 puffs into the lungs every 4 (four) hours as needed (shortness of breath and wheezing).    Yes Historical Provider, MD  aspirin 81 MG chewable tablet Chew 81 mg by mouth daily.    Yes Historical Provider, MD  Calcium Carbonate-Vitamin D (CALCIUM 600/VITAMIN D) 600-400 MG-UNIT per tablet Take 1 tablet by mouth 2 (two) times daily.   Yes Historical Provider, MD  donepezil (ARICEPT) 10 MG tablet Take 10 mg by mouth at bedtime. 01/30/11  Yes Biagio Borg, MD  escitalopram  (LEXAPRO) 10 MG tablet Take 10 mg by mouth daily.   Yes Historical Provider, MD  Fluticasone Furoate-Vilanterol (BREO ELLIPTA) 100-25 MCG/INH AEPB Inhale 1 puff into the lungs daily.   Yes Historical Provider, MD  miconazole (MICOTIN) 2 % cream Apply 1 application topically 3 (three) times daily.   Yes Historical Provider, MD  montelukast (SINGULAIR) 10 MG tablet Take 10 mg by mouth daily.  01/29/13  Yes Historical Provider, MD  Multiple Vitamin (MULTIVITAMIN WITH MINERALS) TABS tablet Take 1 tablet by mouth daily.   Yes Historical Provider, MD  omeprazole (PRILOSEC) 40 MG capsule Take 40 mg by mouth daily.   Yes Historical Provider, MD  OVER THE COUNTER MEDICATION Take 1 Bottle by mouth 2 (two) times daily. House nourishment   Yes Historical Provider, MD  pravastatin (PRAVACHOL) 40 MG tablet Take 40 mg by mouth at bedtime.    Yes Historical Provider, MD  senna-docusate (SENOKOT-S) 8.6-50 MG per tablet Take 2 tablets by mouth at bedtime.   Yes Historical Provider, MD  Skin Protectants, Misc. (EUCERIN) cream Apply 1 application topically every evening.   Yes Historical Provider, MD  nitrofurantoin, macrocrystal-monohydrate, (MACROBID) 100 MG capsule Take 1 capsule (100 mg total) by mouth 2 (two) times daily. Patient not taking: Reported on 02/25/2015 02/11/15   Anne Ng, PA-C    Allergies:  Allergies  Allergen Reactions  . Metoclopramide Hcl Other (See Comments)    unknown  . Simvastatin Other (See Comments)    unknown  Past Medical History: Past Medical History  Diagnosis Date  . HYPERLIPIDEMIA 12/20/2006  . Senile dementia, uncomplicated 06/07/8157  . INSOMNIA-SLEEP DISORDER-UNSPEC 11/09/2006  . BLURRED VISION 06/29/2008  . HYPERTENSION 11/09/2006  . CORONARY ARTERY DISEASE 07/24/2006  . ASTHMATIC BRONCHITIS, ACUTE 01/14/2007  . ALLERGIC RHINITIS 11/09/2006  . GERD 06/04/2006  . CONSTIPATION 03/01/2009  . BACK PAIN 03/01/2009  . OSTEOPOROSIS 10/13/2009  . FATIGUE 01/21/2008  . CHEST PAIN  09/12/2007  . CEREBROVASCULAR ACCIDENT, HX OF 07/24/2006  . Goiter, unspecified 02/01/2010  . Cough 02/01/2010  . Asthma   . Chronic cough   . Stroke ([place])   . Degenerative disc disease     Past Surgical History  Procedure Laterality Date  . Dilation and curettage of uterus      Social History: Patient lives in a memory care unit. Other information is not forthcoming from this patient with dementia.  Family History:  Family History  Problem Relation Age of Onset  . Coronary artery disease Other    Patient is unable to provide this information due to dementia  Review of Systems - unable to do due to dementia  Physical Examination  Filed Vitals:   02/25/15 0930 02/25/15 1000 02/25/15 1030 02/25/15 1151  BP: 123/52 99/56 110/51 126/53  Pulse:   82 58  Temp:      TempSrc:      Resp: _0 Height:      Weight:      SpO2:   100% 97%    BP 126/53 mmHg  Pulse 58  Temp(Src) 97 F (36.1 C) (Rectal)  Resp 20  Ht _1  (1.549 m)  Wt 68.493 kg (151 lb)  BMI 28.55 kg/m2  SpO2 97%  General appearance: alert, appears stated age, distracted and no distress Head: almost 6 cm laceration, which has been sutured, in the right forehead Eyes: conjunctivae/corneas clear. PERRL, EOM's intact.  Throat: lips, mucosa, and tongue normal; teeth and gums normal Neck: no adenopathy, no carotid bruit, no JVD, supple, symmetrical, trachea midline and thyroid not enlarged, symmetric, no tenderness/mass/nodules Resp: clear to auscultation bilaterally Cardio: S1, S2 with the premature beats. No S3, S4. No murmurs. GI: soft, non-tender; bowel sounds normal; no masses,  no organomegaly Extremities: extremities normal, atraumatic, no cyanosis or edema Pulses: 2+ and symmetric Skin: Skin color, texture, turgor normal. No rashes or lesions Lymph nodes: Cervical, supraclavicular, and axillary nodes normal. Neurologic: She is alert. Disoriented and distracted. Follows commands. No facial  asymmetry. Moving all extremities equally.  Laboratory Data: Results for orders placed or performed during the hospital encounter of 02/25/15 (from the past 48 hour(s))  CBC with Differential     Status: Abnormal   Collection Time: 02/25/15  6:28 AM  Result Value Ref Range   WBC 11.6 (H) 4.0 - 10.5 K/uL   RBC 4.07 3.87 - 5.11 MIL/uL   Hemoglobin 12.1 12.0 - 15.0 g/dL   HCT 38.4 36.0 - 46.0 %   MCV 94.3 78.0 - 100.0 fL   MCH 29.7 26.0 - 34.0 pg   MCHC 31.5 30.0 - 36.0 g/dL   RDW 13.7 11.5 - 15.5 %   Platelets 263 150 - 400 K/uL   Neutrophils Relative % 71 %   Neutro Abs 8.2 (H) 1.7 - 7.7 K/uL   Lymphocytes Relative 23 %   Lymphs Abs 2.7 0.7 - 4.0 K/uL   Monocytes Relative 5 %   Monocytes Absolute 0.6 0.1 - 1.0 K/uL   Eosinophils Relative  1 %   Eosinophils Absolute 0.1 0.0 - 0.7 K/uL   Basophils Relative 0 %   Basophils Absolute 0.0 0.0 - 0.1 K/uL  Comprehensive metabolic panel     Status: Abnormal   Collection Time: 02/25/15  6:28 AM  Result Value Ref Range   Sodium 139 135 - 145 mmol/L   Potassium 3.9 3.5 - 5.1 mmol/L   Chloride 104 101 - 111 mmol/L   CO2 26 22 - 32 mmol/L   Glucose, Bld 190 (H) 65 - 99 mg/dL   BUN 18 6 - 20 mg/dL   Creatinine, Ser 0.93 0.44 - 1.00 mg/dL   Calcium 9.5 8.9 - 10.3 mg/dL   Total Protein 6.7 6.5 - 8.1 g/dL   Albumin 3.8 3.5 - 5.0 g/dL   AST 25 15 - 41 U/L   ALT 15 14 - 54 U/L   Alkaline Phosphatase 74 38 - 126 U/L   Total Bilirubin 0.4 0.3 - 1.2 mg/dL   GFR calc non Af Amer 56 (L) >60 mL/min   GFR calc Af Amer >60 >60 mL/min    Comment: (NOTE) The eGFR has been calculated using the CKD EPI equation. This calculation has not been validated in all clinical situations. eGFR's persistently <60 mL/min signify possible Chronic Kidney Disease.    Anion gap 9 5 - 15  CK     Status: None   Collection Time: 02/25/15  6:30 AM  Result Value Ref Range   Total CK 52 38 - 234 U/L  Type and screen     Status: None   Collection Time: 02/25/15   6:30 AM  Result Value Ref Range   ABO/RH(D) O POS    Antibody Screen NEG    Sample Expiration 02/28/2015   Protime-INR     Status: None   Collection Time: 02/25/15  6:30 AM  Result Value Ref Range   Prothrombin Time 14.2 11.6 - 15.2 seconds   INR 1.08 0.00 - 1.49  I-Stat CG4 Lactic Acid, ED     Status: Abnormal   Collection Time: 02/25/15  6:41 AM  Result Value Ref Range   Lactic Acid, Venous 3.64 (HH) 0.5 - 2.0 mmol/L   Comment NOTIFIED PHYSICIAN   ABO/Rh     Status: None   Collection Time: 02/25/15  6:47 AM  Result Value Ref Range   ABO/RH(D) O POS   Urinalysis, Routine w reflex microscopic     Status: Abnormal   Collection Time: 02/25/15  9:00 AM  Result Value Ref Range   Color, Urine YELLOW YELLOW   APPearance CLOUDY (A) CLEAR   Specific Gravity, Urine 1.023 1.005 - 1.030   pH 6.0 5.0 - 8.0   Glucose, UA NEGATIVE NEGATIVE mg/dL   Hgb urine dipstick NEGATIVE NEGATIVE   Bilirubin Urine NEGATIVE NEGATIVE   Ketones, ur NEGATIVE NEGATIVE mg/dL   Protein, ur NEGATIVE NEGATIVE mg/dL   Nitrite NEGATIVE NEGATIVE   Leukocytes, UA TRACE (A) NEGATIVE  Urine microscopic-add on     Status: Abnormal   Collection Time: 02/25/15  9:00 AM  Result Value Ref Range   Squamous Epithelial / LPF 0-5 (A) NONE SEEN   WBC, UA 6-30 0 - 5 WBC/hpf   RBC / HPF 0-5 0 - 5 RBC/hpf   Bacteria, UA RARE (A) NONE SEEN  I-Stat CG4 Lactic Acid, ED  (not at  [facility])     Status: None   Collection Time: 02/25/15 10:21 AM  Result Value Ref Range   Lactic Acid,  Venous 1.89 0.5 - 2.0 mmol/L    Radiology Reports: Ct Head Wo Contrast  02/25/2015  CLINICAL DATA:  Unwitnessed fall at home this morning. Initial encounter. EXAM: CT HEAD WITHOUT CONTRAST CT CERVICAL SPINE WITHOUT CONTRAST TECHNIQUE: Multidetector CT imaging of the head and cervical spine was performed following the standard protocol without intravenous contrast. Multiplanar CT image reconstructions of the cervical spine were also generated. COMPARISON:   02/11/2015 FINDINGS: CT HEAD FINDINGS Skull and Sinuses:Right supraorbital laceration and hematoma. No calvarial fracture. No opaque foreign body. No hemo sinus. Visualized orbits: No traumatic finding. Brain: No evidence of acute infarction, hemorrhage, hydrocephalus, or mass lesion/mass effect. CT CERVICAL SPINE FINDINGS Negative for acute fracture or subluxation. No prevertebral edema. No gross cervical canal hematoma. Focally advanced facet arthropathy on the left at C3-4 with chronic anterolisthesis. A T2 superior endplate concavity is chronic based on previous imaging. IMPRESSION: 1. No evidence of acute intracranial or cervical spine injury. 2. Right supraorbital laceration without fracture. Electronically Signed   By: Monte Fantasia M.D.   On: 02/25/2015 07:51   Ct Cervical Spine Wo Contrast  02/25/2015  CLINICAL DATA:  Unwitnessed fall at home this morning. Initial encounter. EXAM: CT HEAD WITHOUT CONTRAST CT CERVICAL SPINE WITHOUT CONTRAST TECHNIQUE: Multidetector CT imaging of the head and cervical spine was performed following the standard protocol without intravenous contrast. Multiplanar CT image reconstructions of the cervical spine were also generated. COMPARISON:  02/11/2015 FINDINGS: CT HEAD FINDINGS Skull and Sinuses:Right supraorbital laceration and hematoma. No calvarial fracture. No opaque foreign body. No hemo sinus. Visualized orbits: No traumatic finding. Brain: No evidence of acute infarction, hemorrhage, hydrocephalus, or mass lesion/mass effect. CT CERVICAL SPINE FINDINGS Negative for acute fracture or subluxation. No prevertebral edema. No gross cervical canal hematoma. Focally advanced facet arthropathy on the left at C3-4 with chronic anterolisthesis. A T2 superior endplate concavity is chronic based on previous imaging. IMPRESSION: 1. No evidence of acute intracranial or cervical spine injury. 2. Right supraorbital laceration without fracture. Electronically Signed   By: Monte Fantasia M.D.   On: 02/25/2015 07:51   Dg Chest Portable 1 View  02/25/2015  CLINICAL DATA:  Unwitnessed fall.  History of AFib. EXAM: PORTABLE CHEST 1 VIEW COMPARISON:  02/11/2015 FINDINGS: Post CABG changes in the chest. Heart and mediastinum are within normal limits. Lungs are clear without pneumothorax. Degenerative changes in the glenohumeral joints bilaterally. IMPRESSION: No acute cardiopulmonary disease. Electronically Signed   By: Markus Daft M.D.   On: 02/25/2015 07:28    My interpretation of Electrocardiogram: Appears to be sinus rhythm with multiple PVCs. No concerning ischemic changes  Problem List  Principal Problem:   Sepsis ([place]) Active Problems:   Senile dementia, uncomplicated   Fall   Forehead laceration   UTI (lower urinary tract infection)   Assessment: This is a 82 year old Caucasian female with past medical history as stated earlier, who presents after a fall at her memory care unit. She has a right forehead laceration which has been sutured. No intracranial bleeding has been found. She was noted to be hypotensive at initial assessment and was found to have an abnormal UA. Interestingly, she was seen recently in the emergency department and was treated for a UTI with Macrobid. Urine culture at that time grew Escherichia coli.  Plan: #1 Fall with the forehead laceration: Patient will be admitted to the hospital. The laceration has been sutured. We will need to find out when was the last time she got a tetanus vaccine  and then will need to be vaccinated accordingly. **Td last given in 2010 per pharmacy**  #2 Possible sepsis and UTI: Urine cultures will be repeated. Patient's lactic acid was elevated. Sepsis protocol was initiated. Lactic acid level has improved. She'll be given broad-spectrum antibiotics for now. If cultures are negative, these can be deescalated quickly. Patient is hemodynamically stable currently. Blood pressures have improved. She remains hypothermic and  is requiring warming blankets.  #3 history of dementia: Appears to be moderate to advanced. Continue with the Aricept. Patient doesn't remember the events of last night.  #4 history of asthma: Continue with her home medications  #5 Hyperglycemia: No known history of diabetes. Repeat level tomorrow morning.  DVT Prophylaxis: Lovenox Code Status: DO NOT RESUSCITATE Family Communication: Discussed with the patient. No family at bedside. Son was here earlier, but he left.  Disposition Plan: Admit.   Further management decisions will depend on results of further testing and patient's response to treatment.   [facility]  [facility] Pager [phone]  If 7PM-7AM, please contact night-coverage [email or web address] Password [facility]  02/25/2015, 12:06 PM

## 2015-02-25 NOTE — ED Notes (Signed)
p 

## 2015-02-25 NOTE — ED Provider Notes (Signed)
CSN: [id number]     Arrival date & time 02/25/15  0620 History   First MD Initiated Contact with Patient 02/25/15 [phone]     Chief Complaint  Patient presents with  . Fall  . Head Injury     (Consider location/radiation/quality/duration/timing/severity/associated sxs/prior Treatment) Patient is a 82 y.o. female presenting with fall and head injury.  Fall  Head Injury  [name] is a(n) 82 y.o. female who presents via ems. History is gathered from EMS, EMR, and SNF staff. Patient has a hx of Dementia and there is a level 5 caveat. According to the staff the patient had an unwitessed fall overnight and was found lying in her room by the bathroom covered in blood.Unknown down time. The patient does not know what happened or why she is at the hospital. She arrives covered in blood with an obvious, large forehead laceration. She has chronic Afib. She is on ASA without other anticoagulants. The patient denies pain. EMS reports systolic pressure in the 90s which appears low by comparison tho her previous pressures. Our initial shows systolic BP 123.  Past Medical History  Diagnosis Date  . HYPERLIPIDEMIA 12/20/2006  . Senile dementia, uncomplicated 01/06/2009  . INSOMNIA-SLEEP DISORDER-UNSPEC 11/09/2006  . BLURRED VISION 06/29/2008  . HYPERTENSION 11/09/2006  . CORONARY ARTERY DISEASE 07/24/2006  . ASTHMATIC BRONCHITIS, ACUTE 01/14/2007  . ALLERGIC RHINITIS 11/09/2006  . GERD 06/04/2006  . CONSTIPATION 03/01/2009  . BACK PAIN 03/01/2009  . OSTEOPOROSIS 10/13/2009  . FATIGUE 01/21/2008  . CHEST PAIN 09/12/2007  . CEREBROVASCULAR ACCIDENT, HX OF 07/24/2006  . Goiter, unspecified 02/01/2010  . Cough 02/01/2010  . Asthma   . Chronic cough   . Stroke ([facility])   . Degenerative disc disease    Past Surgical History  Procedure Laterality Date  . Dilation and curettage of uterus     Family History  Problem Relation Age of Onset  . Coronary artery disease Other    Social History  Substance Use Topics   . Smoking status: Never Smoker   . Smokeless tobacco: Never Used  . Alcohol Use: No   OB History    No data available     Review of Systems  Unable to review systems due to dementia.  Allergies  Metoclopramide hcl and Simvastatin  Home Medications   Prior to Admission medications   Medication Sig Start Date End Date Taking? Authorizing Provider  acetaminophen (TYLENOL) 325 MG tablet Take 650 mg by mouth every 6 (six) hours as needed.    Historical Provider, MD  Albuterol Sulfate (PROAIR HFA IN) Inhale 2 puffs into the lungs every 4 (four) hours as needed (shortness of breath and wheezing).     Historical Provider, MD  aspirin 81 MG chewable tablet Chew 81 mg by mouth daily.     Historical Provider, MD  Calcium Carbonate-Vitamin D (CALCIUM 600/VITAMIN D) 600-400 MG-UNIT per tablet Take 1 tablet by mouth 2 (two) times daily.    Historical Provider, MD  donepezil (ARICEPT) 10 MG tablet Take 10 mg by mouth at bedtime. 01/30/11   Corwin Levins, MD  escitalopram (LEXAPRO) 10 MG tablet Take 10 mg by mouth daily.    Historical Provider, MD  Fluticasone Furoate-Vilanterol (BREO ELLIPTA) 100-25 MCG/INH AEPB Inhale 1 puff into the lungs daily.    Historical Provider, MD  miconazole (MICOTIN) 2 % cream Apply 1 application topically 3 (three) times daily.    Historical Provider, MD  montelukast (SINGULAIR) 10 MG tablet Take 10 mg by  mouth daily.  01/29/13   Historical Provider, MD  Multiple Vitamin (MULTIVITAMIN WITH MINERALS) TABS tablet Take 1 tablet by mouth daily.    Historical Provider, MD  nitrofurantoin, macrocrystal-monohydrate, (MACROBID) 100 MG capsule Take 1 capsule (100 mg total) by mouth 2 (two) times daily. 02/11/15   Ace Gins Sam, PA-C  omeprazole (PRILOSEC) 40 MG capsule Take 40 mg by mouth daily.    Historical Provider, MD  OVER THE COUNTER MEDICATION Take 1 Bottle by mouth 2 (two) times daily. House nourishment    Historical Provider, MD  pravastatin (PRAVACHOL) 40 MG tablet Take 40  mg by mouth at bedtime.     Historical Provider, MD  senna-docusate (SENOKOT-S) 8.6-50 MG per tablet Take 2 tablets by mouth at bedtime.    Historical Provider, MD  Skin Protectants, Misc. (EUCERIN) cream Apply 1 application topically every evening.    Historical Provider, MD   There were no vitals taken for this visit. Physical Exam  Constitutional: She appears well-developed and well-nourished. No distress.  HENT:  Head: Normocephalic.  10 cm laceration over the right brow. hair, and face covered in blood. Upon arrival.  Eyes: Conjunctivae and EOM are normal. Pupils are equal, round, and reactive to light. No scleral icterus.  Neck: Normal range of motion.  Cardiovascular: Normal rate and normal heart sounds.  Exam reveals no gallop and no friction rub.   No murmur heard. Irregularly irregular rhythm  Pulmonary/Chest: Effort normal and breath sounds normal. No respiratory distress.  Abdominal: Soft. Bowel sounds are normal. She exhibits no distension and no mass. There is no tenderness. There is no guarding.  Abdomen is covered in dried blood. There is small amount of dried feces on the lower right quadrant of the abdomen. It is nontender to palpation, no distention.  Neurological: She is alert.  GCS 15 Follows commands Speech is clear.   Skin: Skin is dry. She is not diaphoretic.  Skin is cool and dry. No mottling  Nursing note and vitals reviewed.   ED Course  .Critical Care Performed by: Arthor Captain Authorized by: Arthor Captain Total critical care time: 45 minutes Critical care time was exclusive of separately billable procedures and treating other patients. Critical care was necessary to treat or prevent imminent or life-threatening deterioration of the following conditions: sepsis. Critical care was time spent personally by me on the following activities: development of treatment plan with patient or surrogate, discussions with consultants, interpretation of cardiac  output measurements, evaluation of patient's response to treatment, examination of patient, obtaining history from patient or surrogate, ordering and performing treatments and interventions, ordering and review of laboratory studies, ordering and review of radiographic studies, pulse oximetry and re-evaluation of patient's condition.  Marland Kitchen.Laceration Repair Date/Time: 02/28/2015 6:15 PM Performed by: Arthor Captain Authorized by: Arthor Captain Consent: The procedure was performed in an emergent situation. Patient identity confirmed: provided demographic data Body area: head/neck Location details: forehead Laceration length: 10 cm Foreign bodies: no foreign bodies Tendon involvement: superficial Anesthesia: local infiltration Local anesthetic: lidocaine 2% with epinephrine Anesthetic total: 10 ml Patient sedated: no Preparation: Patient was prepped and draped in the usual sterile fashion. Irrigation solution: saline Irrigation method: syringe Amount of cleaning: standard Skin closure: 5-0 Prolene Number of sutures: 9 Technique: simple Approximation: close Approximation difficulty: simple Dressing: pressure dressing Patient tolerance: Patient tolerated the procedure well with no immediate complications   (including critical care time) Labs Review Labs Reviewed - No data to display  Imaging Review No results found. I  have personally reviewed and evaluated these images and lab results as part of my medical decision-making.   EKG Interpretation None      .  MDM   Final diagnoses:  None     6:57 AM BP 123/57 mmHg  Temp(Src) 96.6 F (35.9 C) (Rectal)  Resp 16  SpO2 98%  Patient here with fall, head injury. Her lactic acid is 3.64 with a rectal temperature of 36.6, and a leukocytosis. I have begun sepsis protocols on the patient. Patient has already had 500 MLS of fluid. Another 2 L of fluid are started in order to treat weight-based fluid resuscitation. I have  ordered type and screen. EKG shows atrial fibrillation with an old right bundle branch block. It appears unchanged from previous EKG. Rate controlled at 86.    .10:51 AM BP 99/56 mmHg  Pulse 65  Temp(Src) 97 F (36.1 C) (Rectal)  Resp 18  Ht  (1.549 m)  Wt 68.493 kg  BMI 28.55 kg/m2  SpO2 95% Patient with improvement in her lactic acid. Do not see a specific cause for her suspected infection, although her labs, give represented dehydration and hyperthermia from lying on the floor for a long period of time without close on. Her lactate has improved. Her blood pressure has not. I placed a call for admission. She has a normal chest x-ray. She does have pyuria but only rare bacteria. She is currently under treatment for a urinary tract infection. Urine sent for culture today. I am not sure what antibiotic she is taking at this time.  Sepsis - Repeat Assessment  Performed at:    10:30  Vitals     Blood pressure 99/56, pulse 65, temperature 97 F (36.1 C), temperature source Rectal, resp. rate 18, height  (1.549 m), weight 68.493 kg, SpO2 95 %.  Heart:     Irregular rate and rhythm  Lungs:    CTA  Capillary Refill:   <2 sec  Peripheral Pulse:  Distal pulses intact  Skin:     Normal Color    Arthor Captain, PA-C 02/28/15 1817  Raeford Razor, MD 03/07/15 [phone]

## 2015-02-25 NOTE — ED Notes (Signed)
Patient from [place] memory care with unwitnessed fall in room last seen in bed at 0300 this morning EMS called at 0530, EMS reports large pool of blood in patient bathroom.  Per EMS patient takes aspirin daily 81 mg.  Per EMS en route BP 94/60.  Per EMS patient with Hx of afib.

## 2015-02-25 NOTE — Progress Notes (Signed)
Pharmacy Antibiotic Note  [name] is a 82 y.o. female admitted on 02/25/2015 with sepsis.  Pharmacy has been consulted for vancomycin/Zosyn dosing.  Pt was seen in ER on 2/9 and had urine culture that grew E. Coli, see below for sensitivities. Was treated with nitrofurantoin as outpatient.   Plan: Vancomycin 1000 mg IV x1, then vancomycin 500 mg IV q12h Zosyn 3.375 gr IV q8h EI  Height:  (154.9 cm) Weight: 151 lb (68.493 kg) IBW/kg (Calculated) : 47.8  Temp (24hrs), Avg:96.6 F (35.9 C), Min:96.6 F (35.9 C), Max:96.6 F (35.9 C)   Recent Labs Lab 02/25/15 0628 02/25/15 0641  WBC 11.6*  --   CREATININE 0.93  --   LATICACIDVEN  --  3.64*    Estimated Creatinine Clearance: 41.3 mL/min (by C-G formula based on Cr of 0.93).    Allergies  Allergen Reactions  . Metoclopramide Hcl Other (See Comments)    unknown  . Simvastatin Other (See Comments)    unknown    Antimicrobials this admission: 2/23 Zosyn >>  2/23 Vancomycin >>   Dose adjustments this admission:   Microbiology results:  2/9 UCx: > 100K E. Coli ( R to gentamicin, Cipro, ampicillin) ( S to Unasyn, Cefazolin, ceftriaxone, imipenem, nitrofurantoin, Zosyn, Septra)      Thank you for allowing pharmacy to be a part of this patient's care.   Adalberto Cole, PharmD, BCPS Pager [phone] 02/25/2015 7:52 AM

## 2015-02-25 NOTE — ED Notes (Signed)
Pt in CT, will draw blood upon return

## 2015-02-26 DIAGNOSIS — S0181XA Laceration without foreign body of other part of head, initial encounter: Secondary | ICD-10-CM

## 2015-02-26 DIAGNOSIS — D638 Anemia in other chronic diseases classified elsewhere: Secondary | ICD-10-CM | POA: Diagnosis present

## 2015-02-26 DIAGNOSIS — E876 Hypokalemia: Secondary | ICD-10-CM | POA: Diagnosis present

## 2015-02-26 LAB — COMPREHENSIVE METABOLIC PANEL
ALBUMIN: 2.9 g/dL — AB (ref 3.5–5.0)
ALK PHOS: 51 U/L (ref 38–126)
ALT: 13 U/L — ABNORMAL LOW (ref 14–54)
ANION GAP: 7 (ref 5–15)
AST: 25 U/L (ref 15–41)
BILIRUBIN TOTAL: 0.6 mg/dL (ref 0.3–1.2)
BUN: 11 mg/dL (ref 6–20)
CALCIUM: 7.9 mg/dL — AB (ref 8.9–10.3)
CO2: 24 mmol/L (ref 22–32)
Chloride: 108 mmol/L (ref 101–111)
Creatinine, Ser: 0.75 mg/dL (ref 0.44–1.00)
GFR calc non Af Amer: >60 mL/min (ref >60)
GLUCOSE: 102 mg/dL — AB (ref 65–99)
POTASSIUM: 3.4 mmol/L — AB (ref 3.5–5.1)
SODIUM: 139 mmol/L (ref 135–145)
TOTAL PROTEIN: 5.1 g/dL — AB (ref 6.5–8.1)

## 2015-02-26 LAB — URINE CULTURE: CULTURE: NO GROWTH

## 2015-02-26 LAB — CBC
HEMATOCRIT: 28 % — AB (ref 36.0–46.0)
HEMOGLOBIN: 8.7 g/dL — AB (ref 12.0–15.0)
MCH: 29.6 pg (ref 26.0–34.0)
MCHC: 31.1 g/dL (ref 30.0–36.0)
MCV: 95.2 fL (ref 78.0–100.0)
Platelets: 174 10*3/uL (ref 150–400)
RBC: 2.94 MIL/uL — AB (ref 3.87–5.11)
RDW: 14 % (ref 11.5–15.5)
WBC: 6.2 10*3/uL (ref 4.0–10.5)

## 2015-02-26 LAB — C DIFFICILE QUICK SCREEN W PCR REFLEX
C DIFFICILE (CDIFF) TOXIN: NEGATIVE
C DIFFICLE (CDIFF) ANTIGEN: NEGATIVE
C Diff interpretation: NEGATIVE

## 2015-02-26 MED ORDER — MUPIROCIN 2 % EX OINT
1.0000 "application " | TOPICAL_OINTMENT | Freq: Two times a day (BID) | CUTANEOUS | Status: DC
  Administered 2015-02-26 – 2015-03-01 (×7): 1 via NASAL
  Filled 2015-02-26: qty 22

## 2015-02-26 MED ORDER — POTASSIUM CHLORIDE CRYS ER 20 MEQ PO TBCR
40.0000 meq | EXTENDED_RELEASE_TABLET | Freq: Once | ORAL | Status: AC
  Administered 2015-02-26: 40 meq via ORAL
  Filled 2015-02-26: qty 2

## 2015-02-26 MED ORDER — HYDROCORTISONE 1 % EX OINT
1.0000 "application " | TOPICAL_OINTMENT | Freq: Three times a day (TID) | CUTANEOUS | Status: DC
  Administered 2015-02-26 – 2015-02-28 (×7): 1 via TOPICAL
  Filled 2015-02-26 (×2): qty 28.35

## 2015-02-26 MED ORDER — CHLORHEXIDINE GLUCONATE CLOTH 2 % EX PADS
6.0000 | MEDICATED_PAD | Freq: Every day | CUTANEOUS | Status: DC
  Administered 2015-02-26 – 2015-03-01 (×4): 6 via TOPICAL

## 2015-02-26 NOTE — Evaluation (Signed)
Physical Therapy Evaluation Patient Details Name: [name] MRN: [id number] DOB: [birth year] Today's Date: 02/26/2015   History of Present Illness  This 82 y.o female admitted after being found on the floor by the nursing sfaff of her memory care unit.  Dx:  possible sepsis due to UTI.  PMH includes:  dementia, asthma, HTN  Clinical Impression  The patient is pleasant , impulsive with urgency for need for BM. Patient was able to be redirected. Pt admitted with above diagnosis. Pt currently with functional limitations due to the deficits listed below (see PT Problem List). Pt will benefit from skilled PT to increase their independence and safety with mobility to allow discharge to the venue listed below.        Follow Up Recommendations Home health PT;Supervision/Assistance - 24 hour/ back to ALF/memory care if facility can meet needs of very close supervision/assist. If not, rec. SNF. No family present.    Equipment Recommendations  None recommended by PT    Recommendations for Other Services       Precautions / Restrictions Precautions Precautions: Fall Precaution Comments: incontinence      Mobility  Bed Mobility Overal bed mobility: Needs Assistance Bed Mobility: Supine to Sit;Sit to Supine     Supine to sit: Min guard Sit to supine: Min assist   General bed mobility comments: min A to move LEs back onto bed , patient assisted in and out of bed x 2 due to urgency for having BM/diarrhea.  Transfers Overall transfer level: Needs assistance   Transfers: Sit to/from Stand;Stand Pivot Transfers Sit to Stand: Min assist Stand pivot transfers: Min assist;+2 safety/equipment       General transfer comment: Pt is very impulsive with poor safety awareness.  Requires min A for balance and +2 for line management , patient  performed transfers x 2  to [facility], multimodal cues for safety.. BP after transfer 86/33. sats on Ra 88-96, noted mild wheezing and dyspnea.  Ambulation/Gait                Stairs            Wheelchair Mobility    Modified Rankin (Stroke Patients Only)       Balance Overall balance assessment: Needs assistance;History of Falls Sitting-balance support: Feet supported Sitting balance-Leahy Scale: Good     Standing balance support: During functional activity;Single extremity supported Standing balance-Leahy Scale: Poor                               Pertinent Vitals/Pain Pain Assessment: No/denies pain    Home Living Family/patient expects to be discharged to:: Assisted living                      Prior Function           Comments: Pt unable to provide information.  Chart indicates pt with h/o falls      Hand Dominance        Extremity/Trunk Assessment   Upper Extremity Assessment: Defer to OT evaluation           Lower Extremity Assessment: Generalized weakness      Cervical / Trunk Assessment: Normal  Communication   Communication: No difficulties  Cognition Arousal/Alertness: Awake/alert Behavior During Therapy: Anxious;Impulsive Overall Cognitive Status: History of cognitive impairments - at baseline       Memory: Decreased recall of precautions;Decreased short-term memory  General Comments      Exercises        Assessment/Plan    PT Assessment Patient needs continued PT services  PT Diagnosis Difficulty walking;Generalized weakness;Altered mental status   PT Problem List Decreased strength;Decreased activity tolerance;Decreased balance;Decreased mobility;Decreased cognition;Decreased safety awareness;Decreased knowledge of precautions;Cardiopulmonary status limiting activity  PT Treatment Interventions DME instruction;Gait training;Functional mobility training;Therapeutic activities;Therapeutic exercise   PT Goals (Current goals can be found in the Care Plan section) Acute Rehab PT Goals Patient Stated Goal: to get to the BR PT Goal  Formulation: Patient unable to participate in goal setting Time For Goal Achievement: 03/12/15 Potential to Achieve Goals: Fair    Frequency Min 3X/week   Barriers to discharge        Co-evaluation PT/OT/SLP Co-Evaluation/Treatment: Yes Reason for Co-Treatment: For patient/therapist safety PT goals addressed during session: Mobility/safety with mobility OT goals addressed during session: ADL's and self-care       End of Session   Activity Tolerance: Patient tolerated treatment well Patient left: in bed;with call bell/phone within reach;with bed alarm set Nurse Communication: Mobility status         Time: 1610-9604 PT Time Calculation (min) (ACUTE ONLY): 27 min   Charges:   PT Evaluation $PT Eval Low Complexity: 1 Procedure     PT G CodesRada Hay 02/26/2015, 2:09 PM Blanchard Kelch PT [phone]

## 2015-02-26 NOTE — Progress Notes (Signed)
Patient ID: [name], female   DOB: July 13, 1930, 82 y.o.   MRN: [id number]  [facility] PROGRESS NOTE  ESTEPHANIA LICCIARDI XLK:[id number] DOB: November 24, 1930 DOA: 2015/03/05 PCP: Oliver Barre, MD   Brief narrative:    82 y.o. female with a past medical history of dementia, hypertension, asthma who has had multiple visits to the emergency department for falls at her memory care unit. She was apparently found on the floor the morning of the admission by the nursing staff. She was bleeding from her head. EMS was called and the patient was brought into the hospital. It is unclear if the patient had a syncopal episode or not. Patient has dementia and was unable to provide any history.  Assessment/Plan:    Principal Problem:   Fall - ? Deconditioning  Vs UTI - UA not very impressive, agree with continuing ABX for now and follow up on urine culture - Ok to transfer to tele unit - PT eval requested   Active Problems:   Senile dementia, uncomplicated - will likely go back to memory care center when medically ready     Forehead laceration - after fall - clean and keep dry     UTI (lower urinary tract infection) - sepsis ruled out - ABX as noted above and narrow down as soon as possible     Diarrhea - C. Diff negative - monitor     Hypokalemia - mild, supplement and repeat BMP in AM    Acute blood loss anemia - anemia panel pending   DVT prophylaxis - Lovenox SQ  Code Status: Full.  Family Communication:  plan of care discussed with the patient, son over the phone  Disposition Plan: Memory care center by 2/27  IV access:  Peripheral IV  Procedures and diagnostic studies:     Ct Head Wo Contrast 03/05/2015   No evidence of acute intracranial or cervical spine injury. 2. Right supraorbital laceration without fracture.   Ct Cervical Spine Wo Contrast 05-Mar-2015  No evidence of acute intracranial or cervical spine injury. 2. Right supraorbital laceration without fracture.   Dg Chest  Portable 1 View 03-05-15 No acute cardiopulmonary disease.   Medical Consultants:  None  Other Consultants:  PT OT  IAnti-Infectives:   Vancomycin 03-04-22 --> Zosyn 03-04-2022 -->  Debbora Presto, MD  [facility] Pager [phone]  If 7PM-7AM, please contact night-coverage [email or web address] Password [facility] 02/26/2015, 3:56 PM   LOS: 1 day   HPI/Subjective: No events overnight.   Objective: Filed Vitals:   02/26/15 0910 02/26/15 1000 02/26/15 1020 02/26/15 1200  BP: 133/113  Pulse: 35 95 97   Temp:    98.1 F (36.7 C)  TempSrc:    Oral  Resp: Height:      Weight:      SpO2: 80% 94% 99%     Intake/Output Summary (Last 24 hours) at 02/26/15 1556 Last data filed at 02/26/15 1458  Gross per 24 hour  Intake   1725 ml  Output      0 ml  Net   1725 ml    Exam:   General:  Pt is alert, pleasantly confused, not in acute distress  Cardiovascular: Regular rate and rhythm, no rubs, no gallops  Respiratory: Clear to auscultation bilaterally, no wheezing, diminished breath sounds at bases   Abdomen: Soft, non tender, non distended, bowel sounds present, no guarding  Extremities: pulses DP and PT palpable bilaterally  Neuro: Grossly nonfocal  Data  Reviewed: Basic Metabolic Panel:  Recent Labs Lab 02/25/15 0628 02/26/15 0331  NA 139 139  K 3.9 3.4*  CL 104 108  CO2 26 24  GLUCOSE 190* 102*  BUN 18 11  CREATININE 0.93 0.75  CALCIUM 9.5 7.9*   Liver Function Tests:  Recent Labs Lab 02/25/15 0628 02/26/15 0331  AST 25 25  ALT 15 13*  ALKPHOS 74 51  BILITOT 0.4 0.6  PROT 6.7 5.1*  ALBUMIN 3.8 2.9*   CBC:  Recent Labs Lab 02/25/15 0628 02/25/15 1412 02/26/15 0331  WBC 11.6* 8.9 6.2  NEUTROABS 8.2* 6.2  --   HGB 12.1 9.3* 8.7*  HCT 38.4 29.1* 28.0*  MCV 94.3 93.3 95.2  PLT 263 170 174   Cardiac Enzymes:  Recent Labs Lab 02/25/15 0630  CKTOTAL 52    Recent Results (from the past 240 hour(s))  Urine culture      Status: None   Collection Time: 02/25/15  9:00 AM  Result Value Ref Range Status   Specimen Description URINE, CATHETERIZED  Final   Special Requests NONE  Final   Culture   Final    NO GROWTH 1 DAY Performed at [facility]    Report Status 02/26/2015 FINAL  Final  MRSA PCR Screening     Status: Abnormal   Collection Time: 02/25/15  1:17 PM  Result Value Ref Range Status   MRSA by PCR POSITIVE (A) NEGATIVE Final    Comment:        The GeneXpert MRSA Assay (FDA approved for NASAL specimens only), is one component of a comprehensive MRSA colonization surveillance program. It is not intended to diagnose MRSA infection nor to guide or monitor treatment for MRSA infections. RESULT CALLED TO, READ BACK BY AND VERIFIED WITH: JESSICA KAAT,RN [id number] @ 1622 BY J SCOTTON   C difficile quick scan w PCR reflex     Status: None   Collection Time: 02/26/15 10:23 AM  Result Value Ref Range Status   C Diff antigen NEGATIVE NEGATIVE Final   C Diff toxin NEGATIVE NEGATIVE Final   C Diff interpretation Negative for toxigenic C. difficile  Final     Scheduled Meds: . aspirin  81 mg Oral Daily  . Chlorhexidine Gluconate Cloth  6 each Topical Q0600  . donepezil  10 mg Oral QHS  . enoxaparin (LOVENOX) injection  40 mg Subcutaneous Q24H  . escitalopram  10 mg Oral Daily  . fluticasone furoate-vilanterol  1 puff Inhalation Daily  . montelukast  10 mg Oral Daily  . mupirocin ointment  1 application Nasal BID  . pantoprazole  40 mg Oral Daily  . piperacillin-tazobactam (ZOSYN)  IV  3.375 g Intravenous Q8H  . senna-docusate  2 tablet Oral QHS  . sodium chloride flush  3 mL Intravenous Q12H  . vancomycin  500 mg Intravenous Q12H   Continuous Infusions:

## 2015-02-26 NOTE — Evaluation (Signed)
Occupational Therapy Evaluation Patient Details Name: [name] MRN: [id number] DOB: 1930/05/18 Today's Date: 02/26/2015    History of Present Illness This 82 y.o female admitted after being found on the floor by the nursing sfaff of her memory care unit.  Dx:  possible sepsis due to UTI.  PMH includes:  dementia, asthma, HTN   Clinical Impression   Pt admitted with above. She demonstrates the below listed deficits and will benefit from continued OT to maximize safety and independence with BADLs.  Pt presents to OT with generalized weakness, hypotension which limited activity (86/33), h/o dementia, and decreased balance.  Pt is very impulsive with poor safety awareness.  Currently, she requires min A for ADLs.  She was admitted from memory care unit.  Anticipate return back to memory care if facility can meet pt's current care needs.  Pt with frequent bouts of diarrhea during eval.       Follow Up Recommendations  Home health OT;Supervision/Assistance - 24 hour    Equipment Recommendations  None recommended by OT    Recommendations for Other Services       Precautions / Restrictions Precautions Precautions: Fall      Mobility Bed Mobility Overal bed mobility: Needs Assistance Bed Mobility: Supine to Sit;Sit to Supine     Supine to sit: Min guard Sit to supine: Min assist   General bed mobility comments: min A to move LEs back onto bed   Transfers Overall transfer level: Needs assistance   Transfers: Sit to/from Stand;Stand Pivot Transfers Sit to Stand: Min assist Stand pivot transfers: Min assist;+2 safety/equipment       General transfer comment: Pt is very impulsive with poor safety awareness.  Requires min A for balance and +2 for line management     Balance Overall balance assessment: Needs assistance Sitting-balance support: Feet supported Sitting balance-Leahy Scale: Good     Standing balance support: Single extremity supported;During functional  activity Standing balance-Leahy Scale: Poor Standing balance comment: min A for balance                             ADL Overall ADL's : Needs assistance/impaired Eating/Feeding: Set up;Bed level   Grooming: Wash/dry hands;Wash/dry face;Brushing hair;Oral care;Minimal assistance;Sitting Grooming Details (indicate cue type and reason): for thoroughness  Upper Body Bathing: Minimal assitance   Lower Body Bathing: Moderate assistance;Sit to/from stand   Upper Body Dressing : Minimal assistance;Sitting   Lower Body Dressing: Moderate assistance;Sit to/from stand   Toilet Transfer: Minimal assistance;+2 for safety/equipment;BSC Toilet Transfer Details (indicate cue type and reason): Pt is very impulsive.  requires min A for balance and second person to manage lines  Toileting- Clothing Manipulation and Hygiene: Maximal assistance;Sit to/from stand Toileting - Clothing Manipulation Details (indicate cue type and reason): Pt with decreased thoroughness      Functional mobility during ADLs: Minimal assistance;+2 for safety/equipment General ADL Comments: Pt with freqent loos stools requiring 2 transfers bed<>BSC     Vision     Perception     Praxis      Pertinent Vitals/Pain       Hand Dominance Right   Extremity/Trunk Assessment Upper Extremity Assessment Upper Extremity Assessment: Overall WFL for tasks assessed   Lower Extremity Assessment Lower Extremity Assessment: Defer to PT evaluation   Cervical / Trunk Assessment Cervical / Trunk Assessment: Normal   Communication Communication Communication: Prefers language other than English   Cognition Arousal/Alertness: Awake/alert Behavior During Therapy:  WFL for tasks assessed/performed Overall Cognitive Status: History of cognitive impairments - at baseline                     General Comments       Exercises       Shoulder Instructions      Home Living Family/patient expects to be  discharged to:: Assisted living                                        Prior Functioning/Environment          Comments: Pt unable to provide information.  Chart indicates pt with h/o falls     OT Diagnosis: Generalized weakness;Cognitive deficits   OT Problem List: Decreased strength;Decreased activity tolerance;Impaired balance (sitting and/or standing);Decreased cognition;Decreased safety awareness;Decreased knowledge of use of DME or AE   OT Treatment/Interventions: Self-care/ADL training;DME and/or AE instruction;Therapeutic activities;Cognitive remediation/compensation;Patient/family education;Balance training    OT Goals(Current goals can be found in the care plan section) Acute Rehab OT Goals OT Goal Formulation: Patient unable to participate in goal setting Time For Goal Achievement: 03/12/15 Potential to Achieve Goals: Good  OT Frequency: Min 2X/week   Barriers to D/C:            Co-evaluation              End of Session Nurse Communication: Mobility status  Activity Tolerance: Other (comment) (hypotension ) Patient left: in bed;with call bell/phone within reach;with bed alarm set   Time: 0900-0930 OT Time Calculation (min): 30 min Charges:  OT General Charges $OT Visit: 1 Procedure OT Evaluation $OT Eval Moderate Complexity: 1 Procedure G-Codes:    Abby Tucholski M 03/15/15, 10:35 AM

## 2015-02-27 DIAGNOSIS — W19XXXS Unspecified fall, sequela: Secondary | ICD-10-CM

## 2015-02-27 DIAGNOSIS — E876 Hypokalemia: Secondary | ICD-10-CM

## 2015-02-27 DIAGNOSIS — S0181XS Laceration without foreign body of other part of head, sequela: Secondary | ICD-10-CM

## 2015-02-27 LAB — CBC
HEMATOCRIT: 26.9 % — AB (ref 36.0–46.0)
HEMOGLOBIN: 8.5 g/dL — AB (ref 12.0–15.0)
MCH: 30 pg (ref 26.0–34.0)
MCHC: 31.6 g/dL (ref 30.0–36.0)
MCV: 95.1 fL (ref 78.0–100.0)
Platelets: 178 10*3/uL (ref 150–400)
RBC: 2.83 MIL/uL — AB (ref 3.87–5.11)
RDW: 14.1 % (ref 11.5–15.5)
WBC: 6.3 10*3/uL (ref 4.0–10.5)

## 2015-02-27 LAB — BASIC METABOLIC PANEL
Anion gap: 8 (ref 5–15)
BUN: 11 mg/dL (ref 6–20)
CHLORIDE: 110 mmol/L (ref 101–111)
CO2: 25 mmol/L (ref 22–32)
CREATININE: 0.8 mg/dL (ref 0.44–1.00)
Calcium: 8.6 mg/dL — ABNORMAL LOW (ref 8.9–10.3)
GFR calc non Af Amer: >60 mL/min (ref >60)
Glucose, Bld: 119 mg/dL — ABNORMAL HIGH (ref 65–99)
POTASSIUM: 4 mmol/L (ref 3.5–5.1)
Sodium: 143 mmol/L (ref 135–145)

## 2015-02-27 MED ORDER — LEVOFLOXACIN IN D5W 500 MG/100ML IV SOLN: 500.0000 mg | INTRAVENOUS | Status: DC

## 2015-02-27 MED ORDER — LEVOFLOXACIN IN D5W 500 MG/100ML IV SOLN
500.0000 mg | INTRAVENOUS | Status: DC
  Administered 2015-02-27 – 2015-03-01 (×3): 500 mg via INTRAVENOUS
  Filled 2015-02-27 (×3): qty 100

## 2015-02-27 MED ORDER — GUAIFENESIN-DM 100-10 MG/5ML PO SYRP: 5.0000 mL | ORAL_SOLUTION | ORAL | Status: DC | PRN

## 2015-02-27 NOTE — Progress Notes (Addendum)
Patient ID: [name], female   DOB: 1930/10/19, 82 y.o.   MRN: [id number] [facility] PROGRESS NOTE  [name] JXB:[id number] DOB: [birth year] DOA: 03-22-15 PCP: Oliver Barre, MD  Brief narrative:    82 y.o. female with a past medical history of dementia, hypertension, asthma who has had multiple visits to the emergency department for falls at her memory care unit. She was apparently found on the floor the morning of the admission by the nursing staff. She was bleeding from her head.  Pt was stable on admission. Her blood work was notable for hemoglobin of 12.1. No acute intracranial findings seen on CT head or cervical spine.  Assessment/Plan:    Principal Problem:  Recurrent falls / Dementia - Per PT - HHPT recommended, orders placed - Stable so far - Better mental status this am  Active Problems:  Uncomplicated UTI / Sepsis ruled out - Stop vanco and zosyn, sepsis ruled out - Order placed for empiric Levaquin in anticipation of discharge in next 1-2 days    Forehead laceration - S/P fall - Stable    Diarrhea - C. Diff negative - Had 2 BM in past 24 hours  - Stop senokot    Hypokalemia - Due to GI losses, diarrhea - Supplemented and now WNL   Acute blood loss anemia - Possibly from Lovenox subQ, no gross bleed however - Stop Lovenox and use SCD's for DVT prophylaxis  - Okay to continue aspirin but f further drop then stop aspirin as well  - Hemoglobin stable in past 24 hours, 8.7, 8.5  DVT Prophylaxis  - Will stop Lovenox due to anemia, hgb of 8.5 - Use SCD's and aspirin daily    Code Status: DNR/DNI Family Communication:  plan of care discussed with the patient, family not at the bedside this am Disposition Plan: D/C by 03/01/2015   IV access:  Peripheral IV  Procedures and diagnostic studies:     Ct Head Wo Contrast March 22, 2015   1. No evidence of acute intracranial or cervical spine injury. 2. Right supraorbital laceration without fracture.   Ct  Cervical Spine Wo Contrast 2015/03/22  1. No evidence of acute intracranial or cervical spine injury. 2. Right supraorbital laceration without fracture.   Dg Chest Portable 1 View Mar 22, 2015  No acute cardiopulmonary disease. Electronically Signed   By: Richarda Overlie M.D.   On: Mar 22, 2015 07:28   Medical Consultants:  None  Other Consultants:  PT - HH orders placed 2/25  IAnti-Infectives:   Vancomycin 03-21-2022 --> 02/27/2015 Zosyn 03/21/22 --> 02/27/2015 Levaquin 02/27/2015 -->    Manson Passey, MD  [facility] Pager [phone]  Time spent in minutes: 25 minutes  If 7PM-7AM, please contact night-coverage [email or web address] Password [facility] 02/27/2015, 12:57 PM   LOS: 2 days    HPI/Subjective: No acute overnight events. Patient reports ongoing dry cough.  Objective: Filed Vitals:   02/26/15 1600 02/26/15 2049 02/27/15 0524 02/27/15 0936  BP:  118/58 120/68   Pulse:  88 86   Temp: 98.1 F (36.7 C) 98.1 F (36.7 C) 98.2 F (36.8 C)   TempSrc: Oral Oral Oral   Resp:  20 20   Height:   (1.575 m)    Weight:  66.8 kg (147 lb 4.3 oz)    SpO2:  100% 100% 98%    Intake/Output Summary (Last 24 hours) at 02/27/15 1257 Last data filed at 02/27/15 0910  Gross per 24 hour  Intake    220 ml  Output  0 ml  Net    220 ml    Exam:   General:  Pt is more alert, not in acute distress  Cardiovascular: Rate controlled, S1/S2 appreciated   Respiratory: Clear to auscultation bilaterally, no wheezing, no crackles, no rhonchi  Abdomen: Soft, non tender, non distended, bowel sounds present  Extremities: No edema, pulses palpable bilaterally  Neuro: Grossly nonfocal  Data Reviewed: Basic Metabolic Panel:  Recent Labs Lab 02/25/15 0628 02/26/15 0331 02/27/15 0528  NA 139 139 143  K 3.9 3.4* 4.0  CL 104 108 110  CO2 GLUCOSE 190* 102* 119*  BUN CREATININE 0.93 0.75 0.80  CALCIUM 9.5 7.9* 8.6*   Liver Function Tests:  Recent Labs Lab 02/25/15 0628  02/26/15 0331  AST 25 25  ALT 15 13*  ALKPHOS 74 51  BILITOT 0.4 0.6  PROT 6.7 5.1*  ALBUMIN 3.8 2.9*   No results for input(s): LIPASE, AMYLASE in the last 168 hours. No results for input(s): AMMONIA in the last 168 hours. CBC:  Recent Labs Lab 02/25/15 0628 02/25/15 1412 02/26/15 0331 02/27/15 0528  WBC 11.6* 8.9 6.2 6.3  NEUTROABS 8.2* 6.2  --   --   HGB 12.1 9.3* 8.7* 8.5*  HCT 38.4 29.1* 28.0* 26.9*  MCV 94.3 93.3 95.2 95.1  PLT 263 170 174 178   Cardiac Enzymes:  Recent Labs Lab 02/25/15 0630  CKTOTAL 52   BNP: Invalid input(s): POCBNP CBG: No results for input(s): GLUCAP in the last 168 hours.  Recent Results (from the past 240 hour(s))  Blood Culture (routine x 2)     Status: None (Preliminary result)   Collection Time: 02/25/15  7:41 AM  Result Value Ref Range Status   Specimen Description BLOOD LEFT HAND  Final   Culture   Final    NO GROWTH 2 DAYS Performed at [facility]    Report Status PENDING  Incomplete  Blood Culture (routine x 2)     Status: None (Preliminary result)   Collection Time: 02/25/15  8:23 AM  Result Value Ref Range Status   Specimen Description BLOOD LEFT FOREARM  Final   Culture   Final    NO GROWTH 2 DAYS Performed at [facility]    Report Status PENDING  Incomplete  Urine culture     Status: None   Collection Time: 02/25/15  9:00 AM  Result Value Ref Range Status   Specimen Description URINE, CATHETERIZED  Final   Culture   Final    NO GROWTH 1 DAY Performed at [facility]    Report Status 02/26/2015 FINAL  Final  MRSA PCR Screening     Status: Abnormal   Collection Time: 02/25/15  1:17 PM  Result Value Ref Range Status   MRSA by PCR POSITIVE (A) NEGATIVE Final  C difficile quick scan w PCR reflex     Status: None   Collection Time: 02/26/15 10:23 AM  Result Value Ref Range Status   C Diff antigen NEGATIVE NEGATIVE Final   C Diff toxin NEGATIVE NEGATIVE Final   C Diff interpretation  Negative for toxigenic C. difficile  Final     Scheduled Meds: . aspirin  81 mg Oral Daily  . donepezil  10 mg Oral QHS  . enoxaparin (LOVENOX) injection  40 mg Subcutaneous Q24H  . escitalopram  10 mg Oral Daily  . hydrocortisone  1 application Topical TID  . montelukast  10 mg Oral  Daily  . mupirocin ointment  1 application Nasal BID  . pantoprazole  40 mg Oral Daily  . piperacillin-tazobactam (ZOSYN)  IV  3.375 g Intravenous Q8H  . senna-docusate  2 tablet Oral QHS  . vancomycin  500 mg Intravenous Q12H

## 2015-02-28 DIAGNOSIS — D638 Anemia in other chronic diseases classified elsewhere: Secondary | ICD-10-CM

## 2015-02-28 LAB — CBC
HEMATOCRIT: 27.7 % — AB (ref 36.0–46.0)
HEMOGLOBIN: 8.8 g/dL — AB (ref 12.0–15.0)
MCH: 29.9 pg (ref 26.0–34.0)
MCHC: 31.8 g/dL (ref 30.0–36.0)
MCV: 94.2 fL (ref 78.0–100.0)
Platelets: 171 10*3/uL (ref 150–400)
RBC: 2.94 MIL/uL — ABNORMAL LOW (ref 3.87–5.11)
RDW: 14.1 % (ref 11.5–15.5)
WBC: 8.5 10*3/uL (ref 4.0–10.5)

## 2015-02-28 MED ORDER — GERHARDT'S BUTT CREAM
TOPICAL_CREAM | Freq: Three times a day (TID) | CUTANEOUS | Status: DC
  Administered 2015-02-28 – 2015-03-01 (×3): via TOPICAL
  Filled 2015-02-28: qty 1

## 2015-02-28 MED ORDER — GERHARDT'S BUTT CREAM: 1.0000 "application " | TOPICAL_CREAM | Freq: Three times a day (TID) | CUTANEOUS | Status: DC

## 2015-02-28 MED ORDER — LEVOFLOXACIN 500 MG PO TABS
500.0000 mg | ORAL_TABLET | Freq: Every day | ORAL | Status: DC
Start: 2015-02-28 — End: 2016-03-20

## 2015-02-28 MED ORDER — GUAIFENESIN-DM 100-10 MG/5ML PO SYRP: 5.0000 mL | ORAL_SOLUTION | ORAL | Status: DC | PRN

## 2015-02-28 MED ORDER — HYDROCORTISONE 1 % EX OINT: 1.0000 "application " | TOPICAL_OINTMENT | Freq: Three times a day (TID) | CUTANEOUS | Status: DC

## 2015-02-28 NOTE — Discharge Instructions (Signed)

## 2015-02-28 NOTE — Clinical Social Work Note (Signed)
Clinical Social Work Assessment  Patient Details  Name: [name] MRN: [id number] Date of Birth: 1930/08/08  Date of referral:  02/28/15               Reason for consult:  Facility Placement                Permission sought to share information with:  Family Supports Permission granted to share information::  Yes, Release of Information Signed Gaspar Garbe is POA)  Name::     Ian Malkin  Agency::  [facility]  Relationship::  son  Contact Information:     Housing/Transportation Living arrangements for the past 2 months:  Assisted Dealer of Information:  Adult Children Patient Interpreter Needed:  None Criminal Activity/Legal Involvement Pertinent to Current Situation/Hospitalization:  No - Comment as needed Significant Relationships:  Adult Children Lives with:    Do you feel safe going back to the place where you live?  Yes Need for family participation in patient care:  Yes (Comment) (decision making)  Care giving concerns: none- pt is long term resident at [facility]   Social Worker assessment / plan:  CSW spoke with pt son concerning pt return to [employer] [facility].  Employment status:  Retired Health and safety inspector:  Armed forces operational officer, Medicaid In [place] PT Recommendations:  Home with Home Health Information / Referral to community resources:     Patient/Family's Response to care:  Pt son is agreeable to patient return to [facility] and feels as if pt is well cared for there.  Patient/Family's Understanding of and Emotional Response to Diagnosis, Current Treatment, and Prognosis:  Son was concerned about pt skin break down on her buttocks- states MD had mentioned [place] getting wound care consult here to assess and create treatment plan- CSW paged MD to see if this needed follow up  Emotional Assessment Appearance:  Appears stated age Attitude/Demeanor/Rapport:  Unable to Assess Affect (typically observed):  Unable to Assess Orientation:   Oriented to Self Alcohol / Substance use:  Not Applicable Psych involvement (Current and /or in the community):  No (Comment)  Discharge Needs  Concerns to be addressed:  Care Coordination, Discharge Planning Concerns Readmission within the last 30 days:  No Current discharge risk:  None Barriers to Discharge:  Continued Medical Work up   [employer], LCSW 02/28/2015, 2:30 PM

## 2015-02-28 NOTE — Consult Note (Signed)
WOC wound consult note Reason for Consult: Excoriated (linear scratches) to buttocks.  Patient with dementia often found scratching at buttocks.  Skin is dry, peeling in this area, consistent with yeast, but there is no confluent center and only two satellite lesions. Wound type:Both buttocks Pressure Ulcer POA: No Measurement: Wound bed:No wound bed, scattered pin point lesions (partial thickness) from scratching with long fingernails Drainage (amount, consistency, odor) scant serous Periwound:intact, dry Dressing procedure/placement/frequency:I will suggest three times daily application of Dr. Galen Daft Cream, a 1:1:1 compounded mixture of an antifungal , a skin protectant (zinc oxide) and a hydrocortisone cream. WOC nursing team will not follow, but will remain available to this patient, the nursing and medical teams.  Please re-consult if needed. Thanks, Ladona Mow, MSN, RN, GNP, [place]  Pager# [phone]

## 2015-02-28 NOTE — Discharge Summary (Signed)
Physician Discharge Summary  [name] JXB:[id number] DOB: 1930-10-06 DOA: 02/25/2015  PCP: Oliver Barre, MD  Admit date: 02/25/2015 Discharge date: 03/01/2015  Recommendations for Outpatient Follow-up:  1. Pt will need to follow up with PCP in 1-2 weeks post discharge 2. Please obtain BMP to evaluate electrolytes and kidney function 3. Please also check CBC to evaluate Hg and Hct levels 4. Pt will need to complete therapy with Levaquin upon discharge  5. Please note that out WOC team has suggested three times daily application of Dr. Galen Daft Cream, a 1:1:1 compounded mixture of an antifungal, apply to bilateral buttock area, a skin protectant (zinc oxide) and a hydrocortisone cream.  Discharge Diagnoses:  Principal Problem:   Fall Active Problems:   Forehead laceration   UTI (lower urinary tract infection)   Senile dementia, uncomplicated   Hypokalemia   Anemia of chronic disease   Discharge Condition: Stable  Diet recommendation: Heart healthy diet discussed in details     Brief narrative:    82 y.o. female with a past medical history of dementia, hypertension, asthma who has had multiple visits to the emergency department for falls at her memory care unit. She was apparently found on the floor the morning of the admission by the nursing staff. She was bleeding from her head.  Pt was stable on admission. Her blood work was notable for hemoglobin of 12.1. No acute intracranial findings seen on CT head or cervical spine.  Assessment/Plan:    Principal Problem:  Recurrent falls / Dementia - Per PT - HHPT recommended, orders placed - Stable so far - Better mental status this am, remains pleasantly confused   Active Problems:  Uncomplicated UTI / Sepsis ruled out - Stop vanco and zosyn, sepsis ruled out - Order placed for empiric Levaquin and pt will need to complete therapy upon discharge    Forehead laceration - S/P fall - Stable     Excoriated  (linear scratches) to bilateral buttocks - likely induced for scratching as skin is dry and peeling consistent with yeast  - no pressure ulcer has been identified per WOC team, assistance is appreciated  - suggested three times daily application of Dr. Galen Daft Cream, a 1:1:1 compounded mixture of an antifungal , a skin protectant (zinc oxide) and a hydrocortisone cream.   Diarrhea - C. Diff negative - resolved    Hypokalemia - Due to GI losses, diarrhea - will repeat BMP in AM to make sure K is stable and WNL    Acute blood loss anemia - Possibly from Lovenox subQ, no gross bleed however - Stop Lovenox and use SCD's for DVT prophylaxis  - Okay to continue aspirin but f further drop then stop aspirin as well  - Hemoglobin stable in past 48 hours, 8.5 --> 8.8  DVT Prophylaxis  - Use SCD's and aspirin daily    Code Status: DNR/DNI Family Communication: plan of care discussed with the patient, family not at the bedside this am, left message for son to call me, left my cell phone number  Disposition Plan: D/C by 03/01/2015   IV access:  Peripheral IV  Procedures and diagnostic studies:    Ct Head Wo Contrast 02/25/2015 1. No evidence of acute intracranial or cervical spine injury. 2. Right supraorbital laceration without fracture.   Ct Cervical Spine Wo Contrast 02/25/2015 1. No evidence of acute intracranial or cervical spine injury. 2. Right supraorbital laceration without fracture.   Dg Chest Portable 1 View 02/25/2015 No acute  cardiopulmonary disease. Electronically Signed By: Richarda Overlie M.D. On: 02/25/2015 07:28   Medical Consultants:  None  Other Consultants:  PT - HH orders placed 2/25 IAnti-Infectives:   Vancomycin 2/23 --> 02/27/2015 Zosyn 2/23 --> 02/27/2015 Levaquin 02/27/2015 -->        Discharge Exam: Filed Vitals:   02/28/15 0355 02/28/15 1436  BP: 126/56 100/58  Pulse: 86 80  Temp: 98 F (36.7 C) 98.3 F (36.8 C)   Resp: 16 16   Filed Vitals:   02/27/15 2035 02/28/15 0355 02/28/15 0812 02/28/15 1436  BP: 128/46 126/56  100/58  Pulse: 88 86  80  Temp: 97.4 F (36.3 C) 98 F (36.7 C)  98.3 F (36.8 C)  TempSrc: Oral Oral  Oral  Resp: 16 16  16   Height:      Weight:      SpO2: 98% 99% 98% 99%    General: Pt is alert, pleasantly confused, NAD Cardiovascular: Regular rate and rhythm, no rubs, no gallops Respiratory: Clear to auscultation bilaterally, no wheezing, diminished breath sounds at bases  Abdominal: Soft, non tender, non distended, bowel sounds +, no guarding   Discharge Instructions     Medication List    STOP taking these medications        nitrofurantoin (macrocrystal-monohydrate) 100 MG capsule  Commonly known as:  MACROBID      TAKE these medications        acetaminophen 325 MG tablet  Commonly known as:  TYLENOL  Take 650 mg by mouth every 6 (six) hours as needed.     aspirin 81 MG chewable tablet  Chew 81 mg by mouth daily.     BREO ELLIPTA 100-25 MCG/INH Aepb  Generic drug:  fluticasone furoate-vilanterol  Inhale 1 puff into the lungs daily.     CALCIUM 600/VITAMIN D 600-400 MG-UNIT tablet  Generic drug:  Calcium Carbonate-Vitamin D  Take 1 tablet by mouth 2 (two) times daily.     donepezil 10 MG tablet  Commonly known as:  ARICEPT  Take 10 mg by mouth at bedtime.     escitalopram 10 MG tablet  Commonly known as:  LEXAPRO  Take 10 mg by mouth daily.     eucerin cream  Apply 1 application topically every evening.     Gerhardt's butt cream Crea  Apply 1 application topically 3 (three) times daily.     guaiFENesin-dextromethorphan 100-10 MG/5ML syrup  Commonly known as:  ROBITUSSIN DM  Take 5 mLs by mouth every 4 (four) hours as needed for cough.     hydrocortisone 1 % ointment  Apply 1 application topically 3 (three) times daily.     levofloxacin 500 MG tablet  Commonly known as:  LEVAQUIN  Take 1 tablet (500 mg total) by mouth daily.      miconazole 2 % cream  Commonly known as:  MICOTIN  Apply 1 application topically 3 (three) times daily.     montelukast 10 MG tablet  Commonly known as:  SINGULAIR  Take 10 mg by mouth daily.     multivitamin with minerals Tabs tablet  Take 1 tablet by mouth daily.     omeprazole 40 MG capsule  Commonly known as:  PRILOSEC  Take 40 mg by mouth daily.     OVER THE COUNTER MEDICATION  Take 1 Bottle by mouth 2 (two) times daily. House nourishment     pravastatin 40 MG tablet  Commonly known as:  PRAVACHOL  Take 40 mg by mouth at bedtime.  PROAIR HFA IN  Inhale 2 puffs into the lungs every 4 (four) hours as needed (shortness of breath and wheezing).     senna-docusate 8.6-50 MG tablet  Commonly known as:  Senokot-S  Take 2 tablets by mouth at bedtime.            Follow-up Information    Follow up with Oliver Barre, MD.   Specialties:  Internal Medicine, Radiology   Contact information:   [address] [place] [facility] [place] [address] [phone]        The results of significant diagnostics from this hospitalization (including imaging, microbiology, ancillary and laboratory) are listed below for reference.     Microbiology: Recent Results (from the past 240 hour(s))  Blood Culture (routine x 2)     Status: None (Preliminary result)   Collection Time: 02/25/15  7:41 AM  Result Value Ref Range Status   Specimen Description BLOOD LEFT HAND  Final   Special Requests BOTTLES DRAWN AEROBIC AND ANAEROBIC 5CC  Final   Culture   Final    NO GROWTH 3 DAYS Performed at [facility]    Report Status PENDING  Incomplete  Blood Culture (routine x 2)     Status: None (Preliminary result)   Collection Time: 02/25/15  8:23 AM  Result Value Ref Range Status   Specimen Description BLOOD LEFT FOREARM  Final   Special Requests BOTTLES DRAWN AEROBIC ONLY 4CC  Final   Culture   Final    NO GROWTH 3 DAYS Performed at [facility]    Report Status PENDING   Incomplete  Urine culture     Status: None   Collection Time: 02/25/15  9:00 AM  Result Value Ref Range Status   Specimen Description URINE, CATHETERIZED  Final   Special Requests NONE  Final   Culture   Final    NO GROWTH 1 DAY Performed at [facility]    Report Status 02/26/2015 FINAL  Final  MRSA PCR Screening     Status: Abnormal   Collection Time: 02/25/15  1:17 PM  Result Value Ref Range Status   MRSA by PCR POSITIVE (A) NEGATIVE Final    Comment:        The GeneXpert MRSA Assay (FDA approved for NASAL specimens only), is one component of a comprehensive MRSA colonization surveillance program. It is not intended to diagnose MRSA infection nor to guide or monitor treatment for MRSA infections. RESULT CALLED TO, READ BACK BY AND VERIFIED WITH: JESSICA KAAT,RN [id number] @ 1622 BY J SCOTTON   C difficile quick scan w PCR reflex     Status: None   Collection Time: 02/26/15 10:23 AM  Result Value Ref Range Status   C Diff antigen NEGATIVE NEGATIVE Final   C Diff toxin NEGATIVE NEGATIVE Final   C Diff interpretation Negative for toxigenic C. difficile  Final     Labs: Basic Metabolic Panel:  Recent Labs Lab 02/25/15 0628 02/26/15 0331 02/27/15 0528  NA 139 139 143  K 3.9 3.4* 4.0  CL 104 108 110  CO2 26 24 25   GLUCOSE 190* 102* 119*  BUN 18 11 11   CREATININE 0.93 0.75 0.80  CALCIUM 9.5 7.9* 8.6*   Liver Function Tests:  Recent Labs Lab 02/25/15 0628 02/26/15 0331  AST 25 25  ALT 15 13*  ALKPHOS 74 51  BILITOT 0.4 0.6  PROT 6.7 5.1*  ALBUMIN 3.8 2.9*   CBC:  Recent Labs Lab 02/25/15 0628 02/25/15  1412 02/26/15 0331 02/27/15 0528 02/28/15 0516  WBC 11.6* 8.9 6.2 6.3 8.5  NEUTROABS 8.2* 6.2  --   --   --   HGB 12.1 9.3* 8.7* 8.5* 8.8*  HCT 38.4 29.1* 28.0* 26.9* 27.7*  MCV 94.3 93.3 95.2 95.1 94.2  PLT 263 170 174 178 171   Cardiac Enzymes:  Recent Labs Lab 02/25/15 0630  CKTOTAL 52   SIGNED: Time coordinating discharge: 30  minutes  Debbora Presto, MD  [facility] 02/28/2015, 2:44 PM Pager [phone]  If 7PM-7AM, please contact night-coverage [email or web address] Password [email or web address]

## 2015-03-01 LAB — CBC
HEMATOCRIT: 26.9 % — AB (ref 36.0–46.0)
HEMOGLOBIN: 8.6 g/dL — AB (ref 12.0–15.0)
MCH: 30 pg (ref 26.0–34.0)
MCHC: 32 g/dL (ref 30.0–36.0)
MCV: 93.7 fL (ref 78.0–100.0)
Platelets: 182 10*3/uL (ref 150–400)
RBC: 2.87 MIL/uL — AB (ref 3.87–5.11)
RDW: 14.1 % (ref 11.5–15.5)
WBC: 8.1 10*3/uL (ref 4.0–10.5)

## 2015-03-01 LAB — BASIC METABOLIC PANEL
Anion gap: 8 (ref 5–15)
BUN: 13 mg/dL (ref 6–20)
CHLORIDE: 107 mmol/L (ref 101–111)
CO2: 27 mmol/L (ref 22–32)
CREATININE: 0.66 mg/dL (ref 0.44–1.00)
Calcium: 8.7 mg/dL — ABNORMAL LOW (ref 8.9–10.3)
GFR calc non Af Amer: >60 mL/min (ref >60)
Glucose, Bld: 103 mg/dL — ABNORMAL HIGH (ref 65–99)
POTASSIUM: 3.4 mmol/L — AB (ref 3.5–5.1)
Sodium: 142 mmol/L (ref 135–145)

## 2015-03-01 MED ORDER — POTASSIUM CHLORIDE ER 10 MEQ PO TBCR
20.0000 meq | EXTENDED_RELEASE_TABLET | ORAL | Status: AC
  Administered 2015-03-01 (×2): 20 meq via ORAL
  Filled 2015-03-01 (×4): qty 2

## 2015-03-01 MED ORDER — POTASSIUM CHLORIDE 10 MEQ/100ML IV SOLN: 10.0000 meq | INTRAVENOUS | Status: DC

## 2015-03-01 NOTE — Progress Notes (Signed)
Pt stable for discharge Please refer to discharge summary completed 02/28/15 Potassium 10 meq x 3 doses will be given prior to discharge.  [facility] [phone]

## 2015-03-01 NOTE — NC FL2 (Signed)
[place] MEDICAID FL2 LEVEL OF CARE SCREENING TOOL     IDENTIFICATION  Patient Name: [name] Birthdate: Jul 12, 1930 Sex: female Admission Date (Current Location): 02/25/2015  [facility] and [place] Number:  Producer, television/film/video and Address:  [facility], [address] [place], [place] [address]      Provider Number: [id number]  Attending Physician Name and Address:  Alison Murray, MD  Relative Name and Phone Number:       Current Level of Care: Hospital Recommended Level of Care: Assisted Living Facility, Memory Care Prior Approval Number:    Date Approved/Denied:   PASRR Number:    Discharge Plan: Other (Comment) (ALF- memory care unit)    Current Diagnoses: Patient Active Problem List   Diagnosis Date Noted  . Hypokalemia 02/26/2015  . Anemia of chronic disease 02/26/2015  . Fall 02/25/2015  . Forehead laceration 02/25/2015  . UTI (lower urinary tract infection) 02/25/2015  . Senile dementia, uncomplicated 01/06/2009    Orientation RESPIRATION BLADDER Height & Weight     Self  Normal Incontinent Weight: 147 lb 4.3 oz (66.8 kg) Height:   (157.5 cm)  BEHAVIORAL SYMPTOMS/MOOD NEUROLOGICAL BOWEL NUTRITION STATUS      Incontinent Diet (cardiac)  AMBULATORY STATUS COMMUNICATION OF NEEDS Skin   Extensive Assist Verbally Skin abrasions                       Personal Care Assistance Level of Assistance  Bathing, Dressing Bathing Assistance: Limited assistance   Dressing Assistance: Limited assistance     Functional Limitations Info             SPECIAL CARE FACTORS FREQUENCY  PT (By licensed PT), OT (By licensed OT)     PT Frequency: 3/wk with home health OT Frequency: 3/wk with home health            Contractures      Additional Factors Info  Code Status, Allergies Code Status Info: DNR Allergies Info: Metoclopramide Hcl, Simvastatin   Insulin Sliding Scale Info: none       Discharge  Medications: Medication List    STOP taking these medications       nitrofurantoin (macrocrystal-monohydrate) 100 MG capsule  Commonly known as: MACROBID      TAKE these medications       acetaminophen 325 MG tablet  Commonly known as: TYLENOL  Take 650 mg by mouth every 6 (six) hours as needed.     aspirin 81 MG chewable tablet  Chew 81 mg by mouth daily.     BREO ELLIPTA 100-25 MCG/INH Aepb  Generic drug: fluticasone furoate-vilanterol  Inhale 1 puff into the lungs daily.     CALCIUM 600/VITAMIN D 600-400 MG-UNIT tablet  Generic drug: Calcium Carbonate-Vitamin D  Take 1 tablet by mouth 2 (two) times daily.     donepezil 10 MG tablet  Commonly known as: ARICEPT  Take 10 mg by mouth at bedtime.     escitalopram 10 MG tablet  Commonly known as: LEXAPRO  Take 10 mg by mouth daily.     eucerin cream  Apply 1 application topically every evening.     Gerhardt's butt cream Crea  Apply 1 application topically 3 (three) times daily.     guaiFENesin-dextromethorphan 100-10 MG/5ML syrup  Commonly known as: ROBITUSSIN DM  Take 5 mLs by mouth every 4 (four) hours as needed for cough.     hydrocortisone 1 % ointment  Apply 1 application topically 3 (three) times daily.     levofloxacin 500 MG tablet  Commonly known as: LEVAQUIN  Take 1 tablet (500 mg total) by mouth daily.     miconazole 2 % cream  Commonly known as: MICOTIN  Apply 1 application topically 3 (three) times daily.     montelukast 10 MG tablet  Commonly known as: SINGULAIR  Take 10 mg by mouth daily.     multivitamin with minerals Tabs tablet  Take 1 tablet by mouth daily.     omeprazole 40 MG capsule  Commonly known as: PRILOSEC  Take 40 mg by mouth daily.     OVER THE COUNTER MEDICATION  Take 1 Bottle by mouth 2 (two) times daily. House nourishment     pravastatin 40 MG tablet  Commonly known  as: PRAVACHOL  Take 40 mg by mouth at bedtime.     PROAIR HFA IN  Inhale 2 puffs into the lungs every 4 (four) hours as needed (shortness of breath and wheezing).     senna-docusate 8.6-50 MG tablet  Commonly known as: Senokot-S  Take 2 tablets by mouth at bedtime.           Relevant Imaging Results:  Relevant Lab Results:   Additional Information SS#: [id number]  Arlyss Repress, LCSW

## 2015-03-01 NOTE — Progress Notes (Signed)
Phone call placed to [facility] [phone]. Primary RN not available. Report given to [place].

## 2015-03-01 NOTE — Progress Notes (Signed)
Patient is set to discharge back to [name] today. CSW confirmed with Jeanett Schlein at [facility] that patient can return. Patient & son, Gaspar Garbe aware. Discharge packet given to RN, Verlon Au. Son, Gaspar Garbe to transport back to [facility].     Lincoln Maxin, LCSW [facility] Clinical Social Worker cell #: [phone]

## 2015-03-01 NOTE — Progress Notes (Signed)
Patients son came to transport patient to [facility]. Pt was ambulatory with use of walker. Discharge/Continued plan of care discussed with son. Questions, concerns denied. Paper Scrubs/socks provided, no clothes in room at time of DC.

## 2015-03-01 NOTE — Care Management Note (Signed)
Case Management Note  Patient Details  Name: KOYA HUNGER MRN: [id number] Date of Birth: 30-Jun-1930  Subjective/Objective: PT-recc HHPT. From ALF-memory care unit-[facility]-notified CSW if patient can return back w/HHPT.                   Action/Plan:d/c plan ALF.   Expected Discharge Date:   (UNKNOWN)               Expected Discharge Plan:  Assisted Living / Rest Home  In-House Referral:  Clinical Social Work  Discharge planning Services  CM Consult  Post Acute Care Choice:  NA Choice offered to:  NA  DME Arranged:    DME Agency:     HH Arranged:    HH Agency:     Status of Service:  In process, will continue to follow  Medicare Important Message Given:    Date Medicare IM Given:    Medicare IM give by:    Date Additional Medicare IM Given:    Additional Medicare Important Message give by:     If discussed at Long Length of Stay Meetings, dates discussed:    Additional Comments:  Lanier Clam, RN 03/01/2015, 10:04 AM

## 2015-03-01 NOTE — Progress Notes (Signed)
Physical Therapy Treatment Patient Details Name: [name] MRN: [id number] DOB: 09-23-1930 Today's Date: 03/01/2015    History of Present Illness This 82 y.o female admitted after being found on the floor by the nursing sfaff of her memory care unit.  Dx:  possible sepsis due to UTI.  PMH includes:  dementia, asthma, HTN    PT Comments    Assisted pt OOB to amb in hallway using RW for increased safety/balance however pt does not usually use one.  Pt feeling "better".  Tolerated amb well.  Mild unsteady gait.    Follow Up Recommendations   (retrurn to [facility])     Equipment Recommendations       Recommendations for Other Services       Precautions / Restrictions Precautions Precautions: Fall Precaution Comments: incontinence Restrictions Weight Bearing Restrictions: No    Mobility  Bed Mobility Overal bed mobility: Needs Assistance Bed Mobility: Supine to Sit;Sit to Supine     Supine to sit: Supervision;Min guard Sit to supine: Supervision;Min guard   General bed mobility comments: repeat functional VC's and allowed increased time to self complete  Transfers Overall transfer level: Needs assistance Equipment used: None Transfers: Sit to/from Stand Sit to Stand: Min guard         General transfer comment: poor safety cognition/impulsive.  25% VC's on safety and direction.    Ambulation/Gait Ambulation/Gait assistance: Min assist Ambulation Distance (Feet): 55 Feet Assistive device: Rolling walker (2 wheeled) Gait Pattern/deviations: Step-to pattern;Step-through pattern;Drifts right/left Gait velocity: WFL   General Gait Details: assist to direct walker around obsticles and in hallway as stated she does not usually walk with anything.     Stairs            Wheelchair Mobility    Modified Rankin (Stroke Patients Only)       Balance                                    Cognition Arousal/Alertness:  Awake/alert Behavior During Therapy: Anxious;Impulsive Overall Cognitive Status: History of cognitive impairments - at baseline       Memory: Decreased recall of precautions;Decreased short-term memory              Exercises      General Comments        Pertinent Vitals/Pain Pain Assessment: No/denies pain    Home Living                      Prior Function            PT Goals (current goals can now be found in the care plan section) Progress towards PT goals: Progressing toward goals    Frequency  Min 3X/week    PT Plan      Co-evaluation             End of Session Equipment Utilized During Treatment: Gait belt Activity Tolerance: Patient tolerated treatment well Patient left: in bed;with call bell/phone within reach;with bed alarm set     Time: 1445-1500 PT Time Calculation (min) (ACUTE ONLY): 15 min  Charges:  $Gait Training: 8-22 mins                    G Codes:      Felecia Shelling  PTA [facility]  Acute  Rehab Pager      [phone]

## 2015-03-01 NOTE — Care Management Note (Signed)
Case Management Note  Patient Details  Name: KSENIA KUNZ MRN: [id number] Date of Birth: 09-20-1930  Subjective/Objective:PT-recc HHPT. From ALF-[facility].TC [facility]-they have their own PT.CSW managing transp back to ALF.                    Action/Plan:d/c ALF-HHPT.   Expected Discharge Date:   (UNKNOWN)               Expected Discharge Plan:  Assisted Living / Rest Home  In-House Referral:  Clinical Social Work  Discharge planning Services  CM Consult  Post Acute Care Choice:  NA Choice offered to:  NA  DME Arranged:    DME Agency:     HH Arranged:  PT HH Agency:   ([facility])  Status of Service:  Completed, signed off  Medicare Important Message Given:    Date Medicare IM Given:    Medicare IM give by:    Date Additional Medicare IM Given:    Additional Medicare Important Message give by:     If discussed at Long Length of Stay Meetings, dates discussed:    Additional Comments:  Lanier Clam, RN 03/01/2015, 1:29 PM

## 2015-03-02 ENCOUNTER — Telehealth: Payer: Self-pay

## 2015-03-02 LAB — CULTURE, BLOOD (ROUTINE X 2)
Culture: NO GROWTH
Culture: NO GROWTH

## 2015-03-02 NOTE — Telephone Encounter (Signed)
Pt on TCM List.   LVM for pt to call back as soon as possible.

## 2015-03-02 NOTE — Telephone Encounter (Signed)
Son return call back he stated that mom lives in nursing home, and she will be f/u with their MD's...Raechel Chute

## 2015-04-08 ENCOUNTER — Emergency Department (HOSPITAL_COMMUNITY): Payer: Medicare Other

## 2015-04-08 ENCOUNTER — Encounter (HOSPITAL_COMMUNITY): Payer: Self-pay

## 2015-04-08 ENCOUNTER — Emergency Department (HOSPITAL_COMMUNITY)
Admission: EM | Admit: 2015-04-08 | Discharge: 2015-04-08 | Disposition: A | Discharge status: Home / Self Care | Payer: Medicare Other | Attending: Emergency Medicine | Admitting: Emergency Medicine

## 2015-04-08 DIAGNOSIS — K5900 Constipation, unspecified: Secondary | ICD-10-CM | POA: Insufficient documentation

## 2015-04-08 DIAGNOSIS — Z7951 Long term (current) use of inhaled steroids: Secondary | ICD-10-CM | POA: Insufficient documentation

## 2015-04-08 DIAGNOSIS — K219 Gastro-esophageal reflux disease without esophagitis: Secondary | ICD-10-CM | POA: Insufficient documentation

## 2015-04-08 DIAGNOSIS — E785 Hyperlipidemia, unspecified: Secondary | ICD-10-CM | POA: Insufficient documentation

## 2015-04-08 DIAGNOSIS — M810 Age-related osteoporosis without current pathological fracture: Secondary | ICD-10-CM | POA: Insufficient documentation

## 2015-04-08 DIAGNOSIS — Z8673 Personal history of transient ischemic attack (TIA), and cerebral infarction without residual deficits: Secondary | ICD-10-CM | POA: Insufficient documentation

## 2015-04-08 DIAGNOSIS — R531 Weakness: Secondary | ICD-10-CM | POA: Diagnosis present

## 2015-04-08 DIAGNOSIS — F0390 Unspecified dementia without behavioral disturbance: Secondary | ICD-10-CM | POA: Diagnosis not present

## 2015-04-08 DIAGNOSIS — Z79899 Other long term (current) drug therapy: Secondary | ICD-10-CM | POA: Insufficient documentation

## 2015-04-08 DIAGNOSIS — M7981 Nontraumatic hematoma of soft tissue: Secondary | ICD-10-CM | POA: Insufficient documentation

## 2015-04-08 DIAGNOSIS — Z7982 Long term (current) use of aspirin: Secondary | ICD-10-CM | POA: Diagnosis not present

## 2015-04-08 DIAGNOSIS — Z7952 Long term (current) use of systemic steroids: Secondary | ICD-10-CM | POA: Insufficient documentation

## 2015-04-08 DIAGNOSIS — I10 Essential (primary) hypertension: Secondary | ICD-10-CM | POA: Diagnosis not present

## 2015-04-08 DIAGNOSIS — N3000 Acute cystitis without hematuria: Secondary | ICD-10-CM | POA: Diagnosis not present

## 2015-04-08 DIAGNOSIS — Z8639 Personal history of other endocrine, nutritional and metabolic disease: Secondary | ICD-10-CM | POA: Insufficient documentation

## 2015-04-08 DIAGNOSIS — J45909 Unspecified asthma, uncomplicated: Secondary | ICD-10-CM | POA: Diagnosis not present

## 2015-04-08 DIAGNOSIS — I2510 Atherosclerotic heart disease of native coronary artery without angina pectoris: Secondary | ICD-10-CM | POA: Diagnosis not present

## 2015-04-08 LAB — BASIC METABOLIC PANEL
ANION GAP: 8 (ref 5–15)
BUN: 17 mg/dL (ref 6–20)
CALCIUM: 9.1 mg/dL (ref 8.9–10.3)
CO2: 27 mmol/L (ref 22–32)
Chloride: 106 mmol/L (ref 101–111)
Creatinine, Ser: 0.81 mg/dL (ref 0.44–1.00)
Glucose, Bld: 107 mg/dL — ABNORMAL HIGH (ref 65–99)
POTASSIUM: 4 mmol/L (ref 3.5–5.1)
SODIUM: 141 mmol/L (ref 135–145)

## 2015-04-08 LAB — URINE MICROSCOPIC-ADD ON

## 2015-04-08 LAB — CBC
HCT: 30.1 % — ABNORMAL LOW (ref 36.0–46.0)
Hemoglobin: 9.3 g/dL — ABNORMAL LOW (ref 12.0–15.0)
MCH: 25.9 pg — ABNORMAL LOW (ref 26.0–34.0)
MCHC: 30.9 g/dL (ref 30.0–36.0)
MCV: 83.8 fL (ref 78.0–100.0)
PLATELETS: 265 10*3/uL (ref 150–400)
RBC: 3.59 MIL/uL — ABNORMAL LOW (ref 3.87–5.11)
RDW: 16.1 % — AB (ref 11.5–15.5)
WBC: 7.1 10*3/uL (ref 4.0–10.5)

## 2015-04-08 LAB — URINALYSIS, ROUTINE W REFLEX MICROSCOPIC
Bilirubin Urine: NEGATIVE
GLUCOSE, UA: NEGATIVE mg/dL
Hgb urine dipstick: NEGATIVE
KETONES UR: NEGATIVE mg/dL
Nitrite: NEGATIVE
PROTEIN: NEGATIVE mg/dL
Specific Gravity, Urine: 1.017 (ref 1.005–1.030)
pH: 7 (ref 5.0–8.0)

## 2015-04-08 MED ORDER — SODIUM CHLORIDE 0.9 % IV BOLUS (SEPSIS)
1000.0000 mL | Freq: Once | INTRAVENOUS | Status: AC
  Administered 2015-04-08: 1000 mL via INTRAVENOUS

## 2015-04-08 MED ORDER — CEPHALEXIN 500 MG PO CAPS
500.0000 mg | ORAL_CAPSULE | Freq: Once | ORAL | Status: AC
  Administered 2015-04-08: 500 mg via ORAL
  Filled 2015-04-08: qty 1

## 2015-04-08 MED ORDER — CEPHALEXIN 500 MG PO CAPS: 500.0000 mg | ORAL_CAPSULE | Freq: Three times a day (TID) | ORAL | Status: DC

## 2015-04-08 NOTE — ED Notes (Addendum)
Per EMS, pt from [facility].  Pt was in shower was standing.  Pt became weak and had to sit down.  No fall.  Able to get back to bed and EMS called.  Previous episodes of same.  Orthostatic per EMS.  140's down to 100 systolic.  Pt is from dementia unit with confusion.  18g Lt Forearm.  300cc NS given Vitals: Last bp 140/70,  Hr 70's, cbg 144, resp 16.  Pt is DNR with yellow form.

## 2015-04-08 NOTE — ED Notes (Signed)
Bed: [id number] Expected date:  Expected time:  Means of arrival:  Comments: 47F/weakness/orthostatic

## 2015-04-08 NOTE — ED Provider Notes (Signed)
CSN: [id number]     Arrival date & time 04/08/15  0827 History   First MD Initiated Contact with Patient 04/08/15 0831     Chief Complaint  Patient presents with  . Weakness    Level V caveat: Dementia  HPI Patient is brought to the emergency department from the memory care unit after the patient became weak while standing in the shower today.  Family does not think that she fell but she does present with a small hematoma to her left temporal region.  EMS was contacted by the facility she is brought to the emergency department for evaluation.  EMS reports that she had orthostatic vital signs.  Family reports baseline confusion for her and no change in her baseline mental status.  She is a DO NOT RESUSCITATE.  Family reports she's been eating and drinking normally and having no vomiting or diarrhea.  Family reports that sometimes when she gets like this she has a urinary tract infection.   Past Medical History  Diagnosis Date  . HYPERLIPIDEMIA 12/20/2006  . Senile dementia, uncomplicated 01/06/2009  . INSOMNIA-SLEEP DISORDER-UNSPEC 11/09/2006  . BLURRED VISION 06/29/2008  . HYPERTENSION 11/09/2006  . CORONARY ARTERY DISEASE 07/24/2006  . ASTHMATIC BRONCHITIS, ACUTE 01/14/2007  . ALLERGIC RHINITIS 11/09/2006  . GERD 06/04/2006  . CONSTIPATION 03/01/2009  . BACK PAIN 03/01/2009  . OSTEOPOROSIS 10/13/2009  . FATIGUE 01/21/2008  . CHEST PAIN 09/12/2007  . CEREBROVASCULAR ACCIDENT, HX OF 07/24/2006  . Goiter, unspecified 02/01/2010  . Cough 02/01/2010  . Asthma   . Chronic cough   . Stroke (HCC)   . Degenerative disc disease    Past Surgical History  Procedure Laterality Date  . Dilation and curettage of uterus     Family History  Problem Relation Age of Onset  . Coronary artery disease Other    Social History  Substance Use Topics  . Smoking status: Never Smoker   . Smokeless tobacco: Never Used  . Alcohol Use: No   OB History    No data available     Review of Systems  Unable to  perform ROS: Dementia      Allergies  Metoclopramide hcl and Simvastatin  Home Medications   Prior to Admission medications   Medication Sig Start Date End Date Taking? Authorizing Provider  acetaminophen (TYLENOL) 325 MG tablet Take 650 mg by mouth every 6 (six) hours as needed.   Yes Historical Provider, MD  Albuterol Sulfate (PROAIR HFA IN) Inhale 2 puffs into the lungs every 4 (four) hours as needed (shortness of breath and wheezing).    Yes Historical Provider, MD  aspirin 81 MG chewable tablet Chew 81 mg by mouth daily.    Yes Historical Provider, MD  Calcium Carbonate-Vitamin D (CALCIUM 600/VITAMIN D) 600-400 MG-UNIT per tablet Take 1 tablet by mouth 2 (two) times daily.   Yes Historical Provider, MD  donepezil (ARICEPT) 10 MG tablet Take 10 mg by mouth at bedtime. 01/30/11  Yes Corwin Levins, MD  escitalopram (LEXAPRO) 10 MG tablet Take 10 mg by mouth daily.   Yes Historical Provider, MD  Fluticasone Furoate-Vilanterol (BREO ELLIPTA) 100-25 MCG/INH AEPB Inhale 1 puff into the lungs daily.   Yes Historical Provider, MD  guaiFENesin-dextromethorphan (ROBITUSSIN DM) 100-10 MG/5ML syrup Take 5 mLs by mouth every 4 (four) hours as needed for cough. 02/28/15  Yes Dorothea Ogle, MD  Hydrocortisone (GERHARDT'S BUTT CREAM) CREA Apply 1 application topically 3 (three) times daily. 02/28/15  Yes Dorothea Ogle, MD  miconazole (MICOTIN) 2 % cream Apply 1 application topically 3 (three) times daily.   Yes Historical Provider, MD  montelukast (SINGULAIR) 10 MG tablet Take 10 mg by mouth daily.  01/29/13  Yes Historical Provider, MD  Multiple Vitamin (MULTIVITAMIN WITH MINERALS) TABS tablet Take 1 tablet by mouth daily.   Yes Historical Provider, MD  Olopatadine HCl (PATADAY OP) Apply 1 drop to eye daily.   Yes Historical Provider, MD  omeprazole (PRILOSEC) 40 MG capsule Take 40 mg by mouth daily.   Yes Historical Provider, MD  OVER THE COUNTER MEDICATION Take 1 Bottle by mouth 2 (two) times daily.  House nourishment   Yes Historical Provider, MD  pravastatin (PRAVACHOL) 40 MG tablet Take 40 mg by mouth at bedtime.    Yes Historical Provider, MD  senna-docusate (SENOKOT-S) 8.6-50 MG per tablet Take 2 tablets by mouth at bedtime.   Yes Historical Provider, MD  Skin Protectants, Misc. (EUCERIN) cream Apply 1 application topically every evening.   Yes Historical Provider, MD  triamcinolone cream (KENALOG) 0.1 % Apply 1 application topically 2 (two) times daily.   Yes Historical Provider, MD  hydrocortisone 1 % ointment Apply 1 application topically 3 (three) times daily. Patient not taking: Reported on 04/08/2015 02/28/15   Dorothea Ogle, MD  levofloxacin (LEVAQUIN) 500 MG tablet Take 1 tablet (500 mg total) by mouth daily. Patient not taking: Reported on 04/08/2015 02/28/15   Dorothea Ogle, MD   BP 128/59 mmHg  Pulse 78  Temp(Src) 98.6 F (37 C) (Oral)  Resp 18  SpO2 95% Physical Exam  Constitutional: She is oriented to person, place, and time. She appears well-developed and well-nourished. No distress.  HENT:  Head: Normocephalic.  Small hematoma left temporal region without active bleeding  Eyes: EOM are normal.  Neck: Normal range of motion.  Cardiovascular: Normal rate, regular rhythm and normal heart sounds.   Pulmonary/Chest: Effort normal and breath sounds normal.  Abdominal: Soft. She exhibits no distension. There is no tenderness.  Musculoskeletal: Normal range of motion.  Full range of motion bilateral hips, knees, ankles.  Full range of motion bilateral shoulders, elbows, wrists.  Neurological: She is alert and oriented to person, place, and time.  Skin: Skin is warm and dry.  Psychiatric: She has a normal mood and affect. Judgment normal.  Nursing note and vitals reviewed.   ED Course  Procedures (including critical care time) Labs Review Labs Reviewed  CBC - Abnormal; Notable for the following:    RBC 3.59 (*)    Hemoglobin 9.3 (*)    HCT 30.1 (*)    MCH 25.9 (*)     RDW 16.1 (*)    All other components within normal limits  BASIC METABOLIC PANEL - Abnormal; Notable for the following:    Glucose, Bld 107 (*)    All other components within normal limits  URINALYSIS, ROUTINE W REFLEX MICROSCOPIC (NOT AT [facility]) - Abnormal; Notable for the following:    APPearance CLOUDY (*)    Leukocytes, UA SMALL (*)    All other components within normal limits  URINE MICROSCOPIC-ADD ON - Abnormal; Notable for the following:    Squamous Epithelial / LPF 0-5 (*)    Bacteria, UA MANY (*)    All other components within normal limits  URINE CULTURE    Imaging Review Ct Head Wo Contrast  04/08/2015  CLINICAL DATA:  Fall with minor head injury.  Weakness. EXAM: CT HEAD WITHOUT CONTRAST TECHNIQUE: Contiguous axial images were obtained from  the base of the skull through the vertex without intravenous contrast. COMPARISON:  02/25/2015 FINDINGS: Skull and Sinuses:Right forehead scarring. Negative for acute fracture. No hemo sinus. Visualized orbits: Dystrophic globe calcifications. No acute finding Brain: No evidence of acute infarction, hemorrhage, hydrocephalus, or mass lesion/mass effect. Stable pattern of moderate patchy microvascular ischemic gliosis in the cerebral white matter. IMPRESSION: Senescent findings without acute abnormality or change. Electronically Signed   By: Marnee SpringJonathon  Watts M.D.   On: 04/08/2015 10:22   I have personally reviewed and evaluated these images and lab results as part of my medical decision-making.   EKG Interpretation   Date/Time:  Thursday April 08 2015 08:43:09 EDT Ventricular Rate:  67 PR Interval:  160 QRS Duration: 147 QT Interval:  479 QTC Calculation: 506 R Axis:   82 Text Interpretation:  Sinus rhythm Right bundle branch block Baseline  wander in lead(s) V2 No significant change was found Confirmed by Caleah Tortorelli   MD, Infiniti Hoefling ([id number]) on 04/08/2015 9:27:04 AM      MDM   Final diagnoses:  None    Generalized weakness.  Vital signs  within normal limits.  Imaging without abnormality.  Patient does have a urinary tract infection.  Urine culture sent.  Patient be discharged home on Keflex.  Primary care follow-up.    Azalia BilisKevin Roben Schliep, MD 04/08/15 [phone]

## 2015-04-09 LAB — URINE CULTURE: Culture: >=100000 — AB

## 2015-04-10 ENCOUNTER — Telehealth: Payer: Self-pay

## 2015-04-10 NOTE — Progress Notes (Signed)
ED Antimicrobial Stewardship Positive Culture Follow Up  Jessica MuttersBetty F Ray is an 82 y.o. female who [facility] with a chief complaint of generalized weakness and fall.   Chief Complaint  Patient presents with  . Weakness   ? Recent Results (from the past 720 hour(s))  Urine culture     Status: Abnormal   Collection Time: 04/08/15  9:29 AM  Result Value Ref Range Status   Specimen Description URINE, CLEAN CATCH  Final   Special Requests NONE  Final   Culture (A)  Final    >=100,000 COLONIES/mL AEROCOCCUS URINAE Standardized susceptibility testing for this organism is not available. [facility]formed [facility]    Report Status 04/09/2015 FINAL  Final   ? Treated with cephalexin (keflex), organism resistant to prescribed antimicrobial.  New antibiotic prescription: Amoxicillin 500 mg every 8 hours for 7 days  ? ED Provider: Chaney Mallingavid Yao ? Sheron NightingaleJames A Gwyndolyn Guilford 04/10/2015, 8:52 AM Infectious Diseases Pharmacist Phone# [phone]

## 2015-04-10 NOTE — Telephone Encounter (Signed)
Post ED Visit - Positive Culture Follow-up: Successful Patient Follow-Up  Culture assessed and recommendations reviewed by: []  Enzo BiNathan Batchelder, Pharm.D. []  Celedonio MiyamotoJeremy Frens, Pharm.D., BCPS []  Garvin FilaMike Maccia, Pharm.D. []  Georgina PillionElizabeth Martin, Pharm.D., BCPS []  WodenMinh Pham, Vermont[place]arm.D., BCPS, AAHIVP []  Estella HuskMichelle Turner, Pharm.D., BCPS, AAHIVP []  Tennis Mustassie Stewart, Pharm.D. []  Sherle Poeob Vincent, [address]  Positive urine culture  []  Patient discharged without antimicrobial prescription and treatment is now indicated [x]  Organism is resistant to prescribed ED discharge antimicrobial []  Patient with positive blood cultures Pollyann SamplesAndy Johnston PharmD Changes discussed with ED provider: Donald ProseYao, David New antibiotic prescription Amoxcilliin 500 mg po q 8 hours for 7 days Qty 21 Called to Faxed to [facility] [phone] Att; Artist PaisShawna Per [facility] instructions  Contacted patient, date 04/10/2015, time 1015   Jerry CarasCullom, Aadi Bordner Burnett 04/10/2015, 10:15 AM

## 2016-03-20 ENCOUNTER — Emergency Department (HOSPITAL_COMMUNITY): Payer: Medicare Other

## 2016-03-20 ENCOUNTER — Emergency Department (HOSPITAL_COMMUNITY)
Admission: EM | Admit: 2016-03-20 | Discharge: 2016-03-21 | Disposition: A | Discharge status: Home / Self Care | Payer: Medicare Other | Attending: Emergency Medicine | Admitting: Emergency Medicine

## 2016-03-20 ENCOUNTER — Encounter (HOSPITAL_COMMUNITY): Payer: Self-pay | Admitting: Emergency Medicine

## 2016-03-20 DIAGNOSIS — Y929 Unspecified place or not applicable: Secondary | ICD-10-CM | POA: Insufficient documentation

## 2016-03-20 DIAGNOSIS — W19XXXA Unspecified fall, initial encounter: Secondary | ICD-10-CM | POA: Insufficient documentation

## 2016-03-20 DIAGNOSIS — Y939 Activity, unspecified: Secondary | ICD-10-CM | POA: Diagnosis not present

## 2016-03-20 DIAGNOSIS — Y999 Unspecified external cause status: Secondary | ICD-10-CM | POA: Insufficient documentation

## 2016-03-20 DIAGNOSIS — S0990XA Unspecified injury of head, initial encounter: Secondary | ICD-10-CM | POA: Insufficient documentation

## 2016-03-20 DIAGNOSIS — Z23 Encounter for immunization: Secondary | ICD-10-CM | POA: Diagnosis not present

## 2016-03-20 DIAGNOSIS — S61214A Laceration without foreign body of right ring finger without damage to nail, initial encounter: Secondary | ICD-10-CM | POA: Diagnosis not present

## 2016-03-20 DIAGNOSIS — S01111A Laceration without foreign body of right eyelid and periocular area, initial encounter: Secondary | ICD-10-CM | POA: Insufficient documentation

## 2016-03-20 DIAGNOSIS — N390 Urinary tract infection, site not specified: Secondary | ICD-10-CM | POA: Diagnosis not present

## 2016-03-20 LAB — I-STAT CHEM 8, ED
BUN: 26 mg/dL — ABNORMAL HIGH (ref 6–20)
CHLORIDE: 104 mmol/L (ref 101–111)
CREATININE: 0.9 mg/dL (ref 0.44–1.00)
Calcium, Ion: 1.26 mmol/L (ref 1.15–1.40)
GLUCOSE: 120 mg/dL — AB (ref 65–99)
HCT: 39 % (ref 36.0–46.0)
Hemoglobin: 13.3 g/dL (ref 12.0–15.0)
POTASSIUM: 4.4 mmol/L (ref 3.5–5.1)
Sodium: 141 mmol/L (ref 135–145)
TCO2: 29 mmol/L (ref 0–100)

## 2016-03-20 LAB — URINALYSIS, ROUTINE W REFLEX MICROSCOPIC
Bilirubin Urine: NEGATIVE
GLUCOSE, UA: NEGATIVE mg/dL
HGB URINE DIPSTICK: NEGATIVE
Ketones, ur: NEGATIVE mg/dL
Nitrite: POSITIVE — AB
PROTEIN: NEGATIVE mg/dL
Specific Gravity, Urine: 1.025 (ref 1.005–1.030)
Squamous Epithelial / LPF: NONE SEEN
pH: 5 (ref 5.0–8.0)

## 2016-03-20 MED ORDER — TETANUS-DIPHTH-ACELL PERTUSSIS 5-2.5-18.5 LF-MCG/0.5 IM SUSP
0.5000 mL | Freq: Once | INTRAMUSCULAR | Status: AC
  Administered 2016-03-20: 0.5 mL via INTRAMUSCULAR
  Filled 2016-03-20: qty 0.5

## 2016-03-20 MED ORDER — LIDOCAINE-EPINEPHRINE (PF) 2 %-1:200000 IJ SOLN
INTRAMUSCULAR | Status: AC
  Filled 2016-03-20: qty 20

## 2016-03-20 MED ORDER — CEPHALEXIN 500 MG PO CAPS
500.0000 mg | ORAL_CAPSULE | Freq: Once | ORAL | Status: AC
  Administered 2016-03-20: 500 mg via ORAL
  Filled 2016-03-20: qty 1

## 2016-03-20 MED ORDER — CEPHALEXIN 500 MG PO CAPS: 500.0000 mg | ORAL_CAPSULE | Freq: Three times a day (TID) | ORAL | 0 refills | Status: DC

## 2016-03-20 MED ORDER — LIDOCAINE-EPINEPHRINE (PF) 2 %-1:200000 IJ SOLN
20.0000 mL | Freq: Once | INTRAMUSCULAR | Status: AC
  Administered 2016-03-20: 20 mL
  Filled 2016-03-20: qty 20

## 2016-03-20 MED ORDER — BACITRACIN ZINC 500 UNIT/GM EX OINT
TOPICAL_OINTMENT | Freq: Once | CUTANEOUS | Status: AC
  Administered 2016-03-20: 1 via TOPICAL
  Filled 2016-03-20: qty 0.9

## 2016-03-20 MED ORDER — ACETAMINOPHEN 325 MG PO TABS
650.0000 mg | ORAL_TABLET | Freq: Once | ORAL | Status: AC
  Administered 2016-03-20: 650 mg via ORAL
  Filled 2016-03-20: qty 2

## 2016-03-20 NOTE — ED Notes (Signed)
MD Jacubowitz made aware pt bleeding profusely through sutures after applying pressure dressing. MD at bedside now.

## 2016-03-20 NOTE — ED Notes (Signed)
Patient was cleaned up from blood.

## 2016-03-20 NOTE — ED Triage Notes (Signed)
Per EMS [facility] for unwitness[facility], pt found on floor. Staff states pt has been attempting to escape all day. Pt at mental baseline of A&O x 1. Deep 2 cm laceration and hematoma above right brow, deep laceration to right 4th finger. No anticoagulants.

## 2016-03-20 NOTE — ED Notes (Signed)
ED Provider at bedside. 

## 2016-03-20 NOTE — ED Notes (Signed)
Bed: [facility] Expected date:  Expected time:  Means of arrival:  Comments: 

## 2016-03-20 NOTE — ED Provider Notes (Addendum)
[facility]-EMERGENCY DEPT Provider Note   CSN: [id number] Arrival date & time: 03/20/16  1710     History   Chief Complaint Chief Complaint  Patient presents with  . Fall    HPI [name] is a 83 y.o. female.Over 5 caveat dementia history is obtained Boykin Nearing, med tech at memory care unit where patient resides. Patient had an unwitnessed fall this afternoon after she was wandering in the memory care unit. She has been wandering recently as of late. Complains of pain at right periorbital area at site of laceration otherwise denies any complaint. Brought by EMS. No treatment prior to coming here. She suffered lacerations to her right ring finger and right eyebrow as result of event.  HPI  Past Medical History:  Diagnosis Date  . ALLERGIC RHINITIS 11/09/2006  . Asthma   . ASTHMATIC BRONCHITIS, ACUTE 01/14/2007  . BACK PAIN 03/01/2009  . BLURRED VISION 06/29/2008  . CEREBROVASCULAR ACCIDENT, HX OF 07/24/2006  . CHEST PAIN 09/12/2007  . Chronic cough   . CONSTIPATION 03/01/2009  . CORONARY ARTERY DISEASE 07/24/2006  . Cough 02/01/2010  . Degenerative disc disease   . FATIGUE 01/21/2008  . GERD 06/04/2006  . Goiter, unspecified 02/01/2010  . HYPERLIPIDEMIA 12/20/2006  . HYPERTENSION 11/09/2006  . INSOMNIA-SLEEP DISORDER-UNSPEC 11/09/2006  . OSTEOPOROSIS 10/13/2009  . Senile dementia, uncomplicated 01/06/2009  . Stroke [facility])     Patient Active Problem List   Diagnosis Date Noted  . Hypokalemia 02/26/2015  . Anemia of chronic disease 02/26/2015  . Fall 02/25/2015  . Forehead laceration 02/25/2015  . UTI (lower urinary tract infection) 02/25/2015  . Senile dementia, uncomplicated 01/06/2009    Past Surgical History:  Procedure Laterality Date  . DILATION AND CURETTAGE OF UTERUS      OB History    No data available       Home Medications    Prior to Admission medications   Medication Sig Start Date End Date Taking? Authorizing Provider  acetaminophen (TYLENOL) 325 MG  tablet Take 650 mg by mouth every 6 (six) hours as needed.    Historical Provider, MD  Albuterol Sulfate (PROAIR HFA IN) Inhale 2 puffs into the lungs every 4 (four) hours as needed (shortness of breath and wheezing).     Historical Provider, MD  aspirin 81 MG chewable tablet Chew 81 mg by mouth daily.     Historical Provider, MD  Calcium Carbonate-Vitamin D (CALCIUM 600/VITAMIN D) 600-400 MG-UNIT per tablet Take 1 tablet by mouth 2 (two) times daily.    Historical Provider, MD  cephALEXin (KEFLEX) 500 MG capsule Take 1 capsule (500 mg total) by mouth 3 (three) times daily. 04/08/15   Azalia Bilis, MD  donepezil (ARICEPT) 10 MG tablet Take 10 mg by mouth at bedtime. 01/30/11   Corwin Levins, MD  escitalopram (LEXAPRO) 10 MG tablet Take 10 mg by mouth daily.    Historical Provider, MD  Fluticasone Furoate-Vilanterol (BREO ELLIPTA) 100-25 MCG/INH AEPB Inhale 1 puff into the lungs daily.    Historical Provider, MD  guaiFENesin-dextromethorphan (ROBITUSSIN DM) 100-10 MG/5ML syrup Take 5 mLs by mouth every 4 (four) hours as needed for cough. 02/28/15   Dorothea Ogle, MD  Hydrocortisone (GERHARDT'S BUTT CREAM) CREA Apply 1 application topically 3 (three) times daily. 02/28/15   Dorothea Ogle, MD  hydrocortisone 1 % ointment Apply 1 application topically 3 (three) times daily. Patient not taking: Reported on 04/08/2015 02/28/15   Dorothea Ogle, MD  levofloxacin (LEVAQUIN) 500 MG  tablet Take 1 tablet (500 mg total) by mouth daily. Patient not taking: Reported on 04/08/2015 02/28/15   Dorothea OgleIskra M Myers, MD  miconazole (MICOTIN) 2 % cream Apply 1 application topically 3 (three) times daily.    Historical Provider, MD  montelukast (SINGULAIR) 10 MG tablet Take 10 mg by mouth daily.  01/29/13   Historical Provider, MD  Multiple Vitamin (MULTIVITAMIN WITH MINERALS) TABS tablet Take 1 tablet by mouth daily.    Historical Provider, MD  Olopatadine HCl (PATADAY OP) Apply 1 drop to eye daily.    Historical Provider, MD  omeprazole  (PRILOSEC) 40 MG capsule Take 40 mg by mouth daily.    Historical Provider, MD  OVER THE COUNTER MEDICATION Take 1 Bottle by mouth 2 (two) times daily. House nourishment    Historical Provider, MD  pravastatin (PRAVACHOL) 40 MG tablet Take 40 mg by mouth at bedtime.     Historical Provider, MD  senna-docusate (SENOKOT-S) 8.6-50 MG per tablet Take 2 tablets by mouth at bedtime.    Historical Provider, MD  Skin Protectants, Misc. (EUCERIN) cream Apply 1 application topically every evening.    Historical Provider, MD  triamcinolone cream (KENALOG) 0.1 % Apply 1 application topically 2 (two) times daily.    Historical Provider, MD    Family History Family History  Problem Relation Age of Onset  . Coronary artery disease Other     Social History Social History  Substance Use Topics  . Smoking status: Never Smoker  . Smokeless tobacco: Never Used  . Alcohol use No     Allergies   Metoclopramide hcl and Simvastatin   Review of Systems Review of Systems  Unable to perform ROS: Dementia  Skin: Positive for wound.     Physical Exam Updated Vital Signs BP 138/67   Pulse 72   Temp 98.1 F (36.7 C)   Resp 16   SpO2 97%   Physical Exam  Constitutional: She appears well-developed and well-nourished.  HENT:  Head: Normocephalic and atraumatic.  1 cm laceration at right eyebrow with surrounding hematoma. Otherwise normocephalic atraumatic  Eyes: Conjunctivae are normal. Pupils are equal, round, and reactive to light.  Neck: Neck supple. No tracheal deviation present. No thyromegaly present.  Cardiovascular: Normal rate and regular rhythm.   No murmur heard. Pulmonary/Chest: Effort normal and breath sounds normal.  Abdominal: Soft. Bowel sounds are normal. She exhibits no distension. There is no tenderness.  Musculoskeletal: Normal range of motion. She exhibits no edema or tenderness.  Tire spine nontender. Pelvis stable nontender. Right upper extremity there is a 2 cm laceration  at the distal phalanx of the ring finger and no active bleeding. Good capillary refill. Radial pulse 2+. Extremities otherwise atraumatic. All other extremities without contusion abrasion or tenderness neurovascularly intact  Neurological: She is alert. Coordination normal.  Wallace simple commands moves all extremities motor strength 5 over 5 overall  Skin: Skin is warm and dry. No rash noted.  Psychiatric: She has a normal mood and affect.  Nursing note and vitals reviewed.    ED Treatments / Results  Labs (all labs ordered are listed, but only abnormal results are displayed) Labs Reviewed  I-STAT CHEM 8, ED    EKG  EKG Interpretation None       Radiology No results found.  Procedures Procedures (including critical care time)  Medications Ordered in ED Medications  Tdap (BOOSTRIX) injection 0.5 mL (not administered)   Laceration at does not require repair. LACERATION REPAIR Performed by: Doug SouJACUBOWITZ,Micah Barnier Authorized  by: Raghav Verrilli Consent: Verbal consent obtained. Risks and benefits: risks, benefits and alternatives were discussed Consent given by: patient Patient identity confirmed: provided demographic data Prepped and Draped in normal sterile fashion Wound explored  Laceration Location: Right eyebrow  Laceration Length: 1cm  No Foreign Bodies seen or palpated  Anesthesia: local infiltration   Local anesthetic: lidocaine 2% with epinephrine  Anesthetic total: 1 ml  Irrigation method: syringe Amount of cleaning: standard  Skin closure: 5-0 Prolene   Number of sutures: 3  Technique: Simple interrupted   Patient tolerance: Patient tolerated the procedure well with no immediate complications. Urine sent for culture. Initial Impression / Assessment and Plan / ED Course  I have reviewed the triage vital signs and the nursing notes.  Pertinent labs & imaging results that were available during my care of the patient were reviewed by me and considered  in my medical decision making (see chart for details).     Plan prescription Keflex sutures out 5 days Tylenol for pain. Spoke with patient's son he is in agreement with plan. Local wound care to finger 11 PM patient is alert and appears in no distress.  Final Clinical Impressions(s) / ED Diagnoses   #1 fall #2 minor closed head trauma #3   One cm for head laceration #4    two cm finger laceration #5 urinary tract infection Final diagnoses:  None    New Prescriptions New Prescriptions   No medications on file     Doug Sou, MD 03/20/16 2303    Doug Sou, MD 03/21/16 4540

## 2016-03-20 NOTE — Discharge Instructions (Signed)
Urine has been sent for culture. We will call if the antibiotic needs to be changed. Ms. [name] can take Tylenol 650 mg every 4 hours as needed for pain. Sutures to come out in 5 days. Wash the wounds at the right eyebrow and ring finger daily with soap and water and then place a thin layer of bacitracin ointment over the wound and cover with a bandage. Signs of infection including redness around the wounds, drainage from the wounds, feet or fever. Return or call her primary care physician if you think she may be developing an infection

## 2016-03-20 NOTE — ED Notes (Signed)
Bed: [facility]09 Expected date:  Expected time:  Means of arrival:  Comments: No bed no monitor

## 2016-03-21 NOTE — ED Notes (Signed)
MD replaced sutures and this RN applied gauze and pressure dressing. Bleeding through dressing not observed after in place. Ice Pace applied to right side of pts face.

## 2016-03-23 LAB — URINE CULTURE: Special Requests: NORMAL

## 2016-03-30 ENCOUNTER — Encounter (HOSPITAL_COMMUNITY): Payer: Self-pay | Admitting: Emergency Medicine

## 2016-03-30 ENCOUNTER — Emergency Department (HOSPITAL_COMMUNITY)
Admission: EM | Admit: 2016-03-30 | Discharge: 2016-03-30 | Disposition: A | Discharge status: Home / Self Care | Payer: Medicare Other | Attending: Emergency Medicine | Admitting: Emergency Medicine

## 2016-03-30 DIAGNOSIS — T8131XA Disruption of external operation (surgical) wound, not elsewhere classified, initial encounter: Secondary | ICD-10-CM | POA: Diagnosis not present

## 2016-03-30 DIAGNOSIS — I10 Essential (primary) hypertension: Secondary | ICD-10-CM | POA: Diagnosis not present

## 2016-03-30 DIAGNOSIS — F0390 Unspecified dementia without behavioral disturbance: Secondary | ICD-10-CM | POA: Diagnosis present

## 2016-03-30 DIAGNOSIS — Z79899 Other long term (current) drug therapy: Secondary | ICD-10-CM | POA: Diagnosis not present

## 2016-03-30 DIAGNOSIS — Y69 Unspecified misadventure during surgical and medical care: Secondary | ICD-10-CM | POA: Diagnosis not present

## 2016-03-30 DIAGNOSIS — Z8673 Personal history of transient ischemic attack (TIA), and cerebral infarction without residual deficits: Secondary | ICD-10-CM | POA: Diagnosis not present

## 2016-03-30 DIAGNOSIS — I2510 Atherosclerotic heart disease of native coronary artery without angina pectoris: Secondary | ICD-10-CM | POA: Insufficient documentation

## 2016-03-30 DIAGNOSIS — T8133XA Disruption of traumatic injury wound repair, initial encounter: Secondary | ICD-10-CM

## 2016-03-30 DIAGNOSIS — J45909 Unspecified asthma, uncomplicated: Secondary | ICD-10-CM | POA: Diagnosis not present

## 2016-03-30 NOTE — ED Triage Notes (Signed)
[facility] [place]  Patient had fall on 3-19 and was here and received stitches here. Patient removed stitches on right for head herself. patient has open are to right forehead. Patient has dementia.  Per EMS bleeding is controlled when they were on scene but they left guaze that SNF applied in place.

## 2016-03-30 NOTE — ED Notes (Signed)
Dr Patria Maneampos at bedside. Cleaned wound with Benzoin and applied steri-strips.

## 2016-03-30 NOTE — Discharge Instructions (Signed)
If the steri strips come off do not return the patient to the ER. If this occurs neosporin or other topical antibiotic can be applied to the area twice a day until the wound closes and heals

## 2016-03-30 NOTE — ED Notes (Signed)
[facility] notified of transport [facility].

## 2016-03-30 NOTE — ED Provider Notes (Signed)
[facility]-EMERGENCY DEPT Provider Note   CSN: [id number] Arrival date & time: 03/30/16  1031     History   Chief Complaint Chief Complaint  Patient presents with  . patient removed sutures herself   Level V caveat: Dementia  HPI [name] is a 83 y.o. female.  HPI Patient is a 83 year old female with history of dementia who was seen in emergency department on 03/20/2016 for a laceration of her right eyebrow.  Apparently she took the stitches out herself and began bleeding and thus was sent to the ER for evaluation.  No bleeding on my evaluation.  Patient without complaint.  Patient with dementia    Past Medical History:  Diagnosis Date  . ALLERGIC RHINITIS 11/09/2006  . Asthma   . ASTHMATIC BRONCHITIS, ACUTE 01/14/2007  . BACK PAIN 03/01/2009  . BLURRED VISION 06/29/2008  . CEREBROVASCULAR ACCIDENT, HX OF 07/24/2006  . CHEST PAIN 09/12/2007  . Chronic cough   . CONSTIPATION 03/01/2009  . CORONARY ARTERY DISEASE 07/24/2006  . Cough 02/01/2010  . Degenerative disc disease   . FATIGUE 01/21/2008  . GERD 06/04/2006  . Goiter, unspecified 02/01/2010  . HYPERLIPIDEMIA 12/20/2006  . HYPERTENSION 11/09/2006  . INSOMNIA-SLEEP DISORDER-UNSPEC 11/09/2006  . OSTEOPOROSIS 10/13/2009  . Senile dementia, uncomplicated 01/06/2009  . Stroke [facility])     Patient Active Problem List   Diagnosis Date Noted  . Hypokalemia 02/26/2015  . Anemia of chronic disease 02/26/2015  . Fall 02/25/2015  . Forehead laceration 02/25/2015  . UTI (lower urinary tract infection) 02/25/2015  . Senile dementia, uncomplicated 01/06/2009    Past Surgical History:  Procedure Laterality Date  . DILATION AND CURETTAGE OF UTERUS      OB History    No data available       Home Medications    Prior to Admission medications   Medication Sig Start Date End Date Taking? Authorizing Provider  acetaminophen (TYLENOL) 325 MG tablet Take 650 mg by mouth every 6 (six) hours as needed.    Historical Provider, MD    Albuterol Sulfate (PROAIR HFA IN) Inhale 2 puffs into the lungs every 4 (four) hours as needed (shortness of breath and wheezing).     Historical Provider, MD  Calcium Carbonate-Vitamin D (CALCIUM 600/VITAMIN D) 600-400 MG-UNIT per tablet Take 1 tablet by mouth 2 (two) times daily.    Historical Provider, MD  cephALEXin (KEFLEX) 500 MG capsule Take 1 capsule (500 mg total) by mouth 3 (three) times daily. 03/20/16   Doug SouSam Jacubowitz, MD  donepezil (ARICEPT) 10 MG tablet Take 10 mg by mouth at bedtime. 01/30/11   Corwin LevinsJames W [name], MD  escitalopram (LEXAPRO) 20 MG tablet Take 20 mg by mouth daily.     Historical Provider, MD  Fluticasone Furoate-Vilanterol (BREO ELLIPTA) 100-25 MCG/INH AEPB Inhale 1 puff into the lungs daily.    Historical Provider, MD  guaiFENesin-dextromethorphan (ROBITUSSIN DM) 100-10 MG/5ML syrup Take 5 mLs by mouth every 4 (four) hours as needed for cough. 02/28/15   Dorothea OgleIskra M Myers, MD  Hydrocortisone (GERHARDT'S BUTT CREAM) CREA Apply 1 application topically 3 (three) times daily. Patient not taking: Reported on 03/20/2016 02/28/15   Dorothea OgleIskra M Myers, MD  hydrocortisone 1 % ointment Apply 1 application topically 3 (three) times daily. Patient not taking: Reported on 04/08/2015 02/28/15   Dorothea OgleIskra M Myers, MD  ketoconazole (NIZORAL) 2 % cream Apply 1 application topically daily as needed for irritation. buttocks    Historical Provider, MD  loperamide (IMODIUM A-D) 2 MG tablet  Take 2 mg by mouth every 6 (six) hours.    Historical Provider, MD  loratadine (CLARITIN) 10 MG tablet Take 10 mg by mouth daily.    Historical Provider, MD  LORazepam (ATIVAN) 0.5 MG tablet Take 0.5 mg by mouth every 12 (twelve) hours as needed (agitation).    Historical Provider, MD  montelukast (SINGULAIR) 10 MG tablet Take 10 mg by mouth daily.  01/29/13   Historical Provider, MD  Multiple Vitamin (TAB-A-VITE PO) Take 1 tablet by mouth daily.    Historical Provider, MD  neomycin-bacitracin-polymyxin (NEOSPORIN) [phone]  ointment Apply 1 application topically daily. Apply to right ankle and forehead    Historical Provider, MD  omeprazole (PRILOSEC) 40 MG capsule Take 40 mg by mouth daily.    Historical Provider, MD  pravastatin (PRAVACHOL) 40 MG tablet Take 40 mg by mouth at bedtime.     Historical Provider, MD  Skin Protectants, Misc. (EUCERIN) cream Apply 1 application topically every evening.    Historical Provider, MD  triamcinolone cream (KENALOG) 0.1 % Apply 1 application topically 2 (two) times daily.    Historical Provider, MD    Family History Family History  Problem Relation Age of Onset  . Coronary artery disease Other     Social History Social History  Substance Use Topics  . Smoking status: Never Smoker  . Smokeless tobacco: Never Used  . Alcohol use No     Allergies   Metoclopramide hcl and Simvastatin   Review of Systems Review of Systems  Unable to perform ROS: Dementia     Physical Exam Updated Vital Signs BP (!) 117/49   Pulse 71   Temp 98.1 F (36.7 C) (Oral)   Resp 17   SpO2 97%   Physical Exam  Constitutional: She is oriented to person, place, and time. She appears well-developed and well-nourished.  HENT:  Head: Normocephalic.  Open wound of her right eyebrow without active bleeding.  No surrounding erythema.  Ongoing small associated hematoma  Eyes: EOM are normal.  Neck: Normal range of motion.  Pulmonary/Chest: Effort normal.  Abdominal: She exhibits no distension.  Musculoskeletal: Normal range of motion.  Neurological: She is alert and oriented to person, place, and time.  Psychiatric: She has a normal mood and affect.  Nursing note and vitals reviewed.    ED Treatments / Results  Labs (all labs ordered are listed, but only abnormal results are displayed) Labs Reviewed - No data to display  EKG  EKG Interpretation None       Radiology No results found.    +++++++++++++++++++++++++++++++++++++++++++++++++++  Procedures .Marland KitchenLaceration  Repair Performed by: Azalia Bilis Authorized by: Azalia Bilis     Patient identity confirmed: provided demographic data Time out performed prior to procedure Prepped and Draped in normal sterile fashion Wound explored Laceration Location: right eyebrow Laceration Length: 1.5cm No Foreign Bodies seen or palpated Anesthesia:none Amount of cleaning: standard Skin closure: steristrips Number of sutures or staples: 2 steristrips Technique: steristrips Patient tolerance: Patient tolerated the procedure well with no immediate complications.   ++++++++++++++++++++++++++++++++++++++++++++++++++   Medications Ordered in ED Medications - No data to display   Initial Impression / Assessment and Plan / ED Course  I have reviewed the triage vital signs and the nursing notes.  Pertinent labs & imaging results that were available during my care of the patient were reviewed by me and considered in my medical decision making (see chart for details).     Steri-Strips applied with good wound approximation.  No  signs of infection at this time.  Final Clinical Impressions(s) / ED Diagnoses   Final diagnoses:  Traumatic wound dehiscence, initial encounter    New Prescriptions New Prescriptions   No medications on file     Azalia Bilis, MD 03/30/16 1056

## 2016-03-30 NOTE — ED Notes (Signed)
[facility] Expected date:  Expected time:  Means of arrival:  Comments: EMS 83 y/o head lac, dementia

## 2016-03-30 NOTE — ED Notes (Signed)
When removed guaze to assess wound, patient has open laceration approx 1inch in diameter.

## 2016-06-07 ENCOUNTER — Encounter (HOSPITAL_COMMUNITY): Payer: Self-pay | Admitting: Emergency Medicine

## 2016-06-07 ENCOUNTER — Emergency Department (HOSPITAL_COMMUNITY): Payer: Medicare Other

## 2016-06-07 ENCOUNTER — Emergency Department (HOSPITAL_COMMUNITY)
Admission: EM | Admit: 2016-06-07 | Discharge: 2016-06-07 | Disposition: A | Discharge status: Skilled Nursing Facility | Payer: Medicare Other | Attending: Emergency Medicine | Admitting: Emergency Medicine

## 2016-06-07 DIAGNOSIS — Y9389 Activity, other specified: Secondary | ICD-10-CM | POA: Insufficient documentation

## 2016-06-07 DIAGNOSIS — S62318A Displaced fracture of base of other metacarpal bone, initial encounter for closed fracture: Secondary | ICD-10-CM | POA: Diagnosis not present

## 2016-06-07 DIAGNOSIS — Z8673 Personal history of transient ischemic attack (TIA), and cerebral infarction without residual deficits: Secondary | ICD-10-CM | POA: Insufficient documentation

## 2016-06-07 DIAGNOSIS — S62314A Displaced fracture of base of fourth metacarpal bone, right hand, initial encounter for closed fracture: Secondary | ICD-10-CM | POA: Diagnosis not present

## 2016-06-07 DIAGNOSIS — S0083XA Contusion of other part of head, initial encounter: Secondary | ICD-10-CM | POA: Insufficient documentation

## 2016-06-07 DIAGNOSIS — W19XXXA Unspecified fall, initial encounter: Secondary | ICD-10-CM | POA: Diagnosis not present

## 2016-06-07 DIAGNOSIS — S52501A Unspecified fracture of the lower end of right radius, initial encounter for closed fracture: Secondary | ICD-10-CM | POA: Insufficient documentation

## 2016-06-07 DIAGNOSIS — Y999 Unspecified external cause status: Secondary | ICD-10-CM | POA: Insufficient documentation

## 2016-06-07 DIAGNOSIS — I10 Essential (primary) hypertension: Secondary | ICD-10-CM | POA: Insufficient documentation

## 2016-06-07 DIAGNOSIS — Z79899 Other long term (current) drug therapy: Secondary | ICD-10-CM | POA: Diagnosis not present

## 2016-06-07 DIAGNOSIS — Y929 Unspecified place or not applicable: Secondary | ICD-10-CM | POA: Insufficient documentation

## 2016-06-07 DIAGNOSIS — S62231A Other displaced fracture of base of first metacarpal bone, right hand, initial encounter for closed fracture: Secondary | ICD-10-CM

## 2016-06-07 DIAGNOSIS — S0990XA Unspecified injury of head, initial encounter: Secondary | ICD-10-CM | POA: Diagnosis not present

## 2016-06-07 DIAGNOSIS — Z23 Encounter for immunization: Secondary | ICD-10-CM | POA: Insufficient documentation

## 2016-06-07 DIAGNOSIS — S62310A Displaced fracture of base of second metacarpal bone, right hand, initial encounter for closed fracture: Secondary | ICD-10-CM | POA: Diagnosis not present

## 2016-06-07 DIAGNOSIS — S61011A Laceration without foreign body of right thumb without damage to nail, initial encounter: Secondary | ICD-10-CM | POA: Insufficient documentation

## 2016-06-07 DIAGNOSIS — J45909 Unspecified asthma, uncomplicated: Secondary | ICD-10-CM | POA: Insufficient documentation

## 2016-06-07 DIAGNOSIS — S6991XA Unspecified injury of right wrist, hand and finger(s), initial encounter: Secondary | ICD-10-CM | POA: Diagnosis present

## 2016-06-07 MED ORDER — LIDOCAINE HCL (PF) 1 % IJ SOLN
30.0000 mL | Freq: Once | INTRAMUSCULAR | Status: AC
  Administered 2016-06-07: 30 mL
  Filled 2016-06-07: qty 30

## 2016-06-07 MED ORDER — HYDROCODONE-ACETAMINOPHEN 5-325 MG PO TABS: 1.0000 | ORAL_TABLET | ORAL | 0 refills | Status: DC | PRN

## 2016-06-07 MED ORDER — TETANUS-DIPHTH-ACELL PERTUSSIS 5-2.5-18.5 LF-MCG/0.5 IM SUSP
0.5000 mL | Freq: Once | INTRAMUSCULAR | Status: AC
  Administered 2016-06-07: 0.5 mL via INTRAMUSCULAR
  Filled 2016-06-07: qty 0.5

## 2016-06-07 MED ORDER — DOCUSATE SODIUM 100 MG PO CAPS: 100.0000 mg | ORAL_CAPSULE | Freq: Two times a day (BID) | ORAL | 0 refills | Status: AC

## 2016-06-07 NOTE — ED Notes (Signed)
Tim Consulting civil engineerCharge RN at bedside applying wound car to right had laceration

## 2016-06-07 NOTE — ED Provider Notes (Signed)
[facility]-EMERGENCY DEPT Provider Note   CSN: [id number] Arrival date & time: 06/07/16  1411     History   Chief Complaint Chief Complaint  Patient presents with  . Fall    HPI [name] is a 83 y.o. female.  Pt presents to the ED today via EMS from NH s/p fall.  Pt has dementia and is unable to remember what happened.  Pt fell while using her walker.  Fall was not witnessed, but pt did hit her head and injured her right thumb.  She denies any other pain location.      Past Medical History:  Diagnosis Date  . ALLERGIC RHINITIS 11/09/2006  . Asthma   . ASTHMATIC BRONCHITIS, ACUTE 01/14/2007  . BACK PAIN 03/01/2009  . BLURRED VISION 06/29/2008  . CEREBROVASCULAR ACCIDENT, HX OF 07/24/2006  . CHEST PAIN 09/12/2007  . Chronic cough   . CONSTIPATION 03/01/2009  . CORONARY ARTERY DISEASE 07/24/2006  . Cough 02/01/2010  . Degenerative disc disease   . FATIGUE 01/21/2008  . GERD 06/04/2006  . Goiter, unspecified 02/01/2010  . HYPERLIPIDEMIA 12/20/2006  . HYPERTENSION 11/09/2006  . INSOMNIA-SLEEP DISORDER-UNSPEC 11/09/2006  . OSTEOPOROSIS 10/13/2009  . Senile dementia, uncomplicated 01/06/2009  . Stroke [facility])     Patient Active Problem List   Diagnosis Date Noted  . Hypokalemia 02/26/2015  . Anemia of chronic disease 02/26/2015  . Fall 02/25/2015  . Forehead laceration 02/25/2015  . UTI (lower urinary tract infection) 02/25/2015  . Senile dementia, uncomplicated 01/06/2009    Past Surgical History:  Procedure Laterality Date  . DILATION AND CURETTAGE OF UTERUS      OB History    No data available       Home Medications    Prior to Admission medications   Medication Sig Start Date End Date Taking? Authorizing Provider  acetaminophen (TYLENOL) 325 MG tablet Take 650 mg by mouth every 6 (six) hours as needed.   Yes [provider]  Albuterol Sulfate (PROAIR HFA IN) Inhale 2 puffs into the lungs every 4 (four) hours as needed (shortness of breath and wheezing).     Yes [provider]  Calcium Carbonate-Vitamin D (CALCIUM 600/VITAMIN D) 600-400 MG-UNIT per tablet Take 1 tablet by mouth 2 (two) times daily.   Yes [provider]  donepezil (ARICEPT) 10 MG tablet Take 10 mg by mouth at bedtime. 01/30/11  Yes Corwin Levins, MD  escitalopram (LEXAPRO) 20 MG tablet Take 20 mg by mouth daily.    Yes [provider]  Fluticasone Furoate-Vilanterol (BREO ELLIPTA) 100-25 MCG/INH AEPB Inhale 1 puff into the lungs daily.   Yes [provider]  guaiFENesin-dextromethorphan (ROBITUSSIN DM) 100-10 MG/5ML syrup Take 5 mLs by mouth every 4 (four) hours as needed for cough. 02/28/15  Yes Dorothea Ogle, MD  ketoconazole (NIZORAL) 2 % cream Apply 1 application topically daily as needed for irritation. buttocks   Yes [provider]  loperamide (IMODIUM A-D) 2 MG tablet Take 2 mg by mouth every 6 (six) hours.   Yes [provider]  loratadine (CLARITIN) 10 MG tablet Take 10 mg by mouth daily.   Yes [provider]  LORazepam (ATIVAN) 0.5 MG tablet Take 0.5 mg by mouth every 12 (twelve) hours as needed (agitation).   Yes [provider]  montelukast (SINGULAIR) 10 MG tablet Take 10 mg by mouth daily.  01/29/13  Yes [provider]  Multiple Vitamin (TAB-A-VITE PO) Take 1 tablet by mouth daily.  Yes [provider]  neomycin-bacitracin-polymyxin (NEOSPORIN) [phone] ointment Apply 1 application topically daily. Apply to right ankle and forehead   Yes [provider]  omeprazole (PRILOSEC) 40 MG capsule Take 40 mg by mouth daily.   Yes [provider]  pravastatin (PRAVACHOL) 40 MG tablet Take 40 mg by mouth at bedtime.    Yes [provider]  Skin Protectants, Misc. (EUCERIN) cream Apply 1 application topically every evening.   Yes [provider]  triamcinolone cream (KENALOG) 0.1 % Apply 1 application topically 2 (two) times daily.   Yes [provider]  cephALEXin (KEFLEX) 500 MG capsule Take 1 capsule (500 mg total) by mouth 3 (three) times daily. Patient not taking: Reported on 06/07/2016 03/20/16   Doug Sou, MD  docusate sodium (COLACE) 100 MG capsule Take 1 capsule (100 mg total) by mouth every 12 (twelve) hours. Take while on lortab to avoid constipation. 06/07/16   Jacalyn Lefevre, MD  HYDROcodone-acetaminophen (NORCO/VICODIN) 5-325 MG tablet Take 1 tablet by mouth every 4 (four) hours as needed. 06/07/16   Jacalyn Lefevre, MD  Hydrocortisone (GERHARDT'S BUTT CREAM) CREA Apply 1 application topically 3 (three) times daily. Patient not taking: Reported on 03/20/2016 02/28/15   Dorothea Ogle, MD  hydrocortisone 1 % ointment Apply 1 application topically 3 (three) times daily. Patient not taking: Reported on 04/08/2015 02/28/15   Dorothea Ogle, MD    Family History Family History  Problem Relation Age of Onset  . Coronary artery disease Other     Social History Social History  Substance Use Topics  . Smoking status: Never Smoker  . Smokeless tobacco: Never Used  . Alcohol use No     Allergies   Metoclopramide hcl and Simvastatin   Review of Systems Review of Systems  Musculoskeletal:       Right thumb pain  Skin: Positive for wound.  All other systems reviewed and are negative.    Physical Exam Updated Vital Signs BP (!) 118/49 (BP Location: Left Arm)   Pulse 65   Temp 97.9 F (36.6 C) (Oral)   Resp 16   SpO2 96%   Physical Exam  Constitutional: She appears well-developed and well-nourished.  HENT:  Head: Normocephalic.    Right Ear: External ear normal.  Left Ear: External ear normal.  Nose: Nose normal.  Mouth/Throat: Oropharynx is clear and moist.  Eyes: Conjunctivae and EOM are normal. Pupils are equal, round, and reactive to light.  Neck: Normal range of motion. Neck supple.  Cardiovascular: Normal rate, regular rhythm, normal heart sounds and intact distal pulses.   Pulmonary/Chest:  Effort normal and breath sounds normal.  Abdominal: Soft. Bowel sounds are normal.  Musculoskeletal:       Arms: Neurological: She is alert.  Oriented per norm  Skin: Skin is warm and dry.  Psychiatric: She has a normal mood and affect. Her behavior is normal. Judgment and thought content normal.  Nursing note and vitals reviewed.    ED Treatments / Results  Labs (all labs ordered are listed, but only abnormal results are displayed) Labs Reviewed - No data to display  EKG  EKG Interpretation None       Radiology Dg Wrist Complete Right  Result Date: 06/07/2016 CLINICAL DATA:  Fall with pain EXAM: RIGHT WRIST - COMPLETE 3+ VIEW COMPARISON:  None. FINDINGS: Comminuted fracture base of first metacarpal with additional fractures of the base of the second third and fourth metacarpals. Status post surgical plate and screw fixation  of the distal radius with intact hardware. There is subtle lucency in the distal radius on the lateral view which is suspect for nondisplaced fracture. IMPRESSION: Status post surgical plate and screw fixation of distal radius. Suspect very subtle acute nondisplaced distal radius fracture. Additional comminuted and displaced fractures involving the proximal first through fourth metacarpals. Electronically Signed   By: Jasmine Pang M.D.   On: 06/07/2016 15:54   Ct Head Wo Contrast  Result Date: 06/07/2016 CLINICAL DATA:  Fall.  Pain and swelling at right forehead. EXAM: CT HEAD WITHOUT CONTRAST CT CERVICAL SPINE WITHOUT CONTRAST TECHNIQUE: Multidetector CT imaging of the head and cervical spine was performed following the standard protocol without intravenous contrast. Multiplanar CT image reconstructions of the cervical spine were also generated. COMPARISON:  03/20/2016 FINDINGS: CT HEAD FINDINGS Brain: There is atrophy and chronic small vessel disease changes. No acute intracranial abnormality. Specifically, no hemorrhage, hydrocephalus, mass lesion, acute  infarction, or significant intracranial injury. Vascular: No hyperdense vessel or unexpected calcification. Skull: No acute calvarial abnormality. Sinuses/Orbits: Visualized paranasal sinuses and mastoids clear. Orbital soft tissues unremarkable. Other: Soft tissue swelling over the right lateral orbital wall and forehead region. No underlying bony abnormality. CT CERVICAL SPINE FINDINGS Alignment: Slight anterolisthesis of C3 on C4, stable related to facet disease. Skull base and vertebrae: No fracture. Soft tissues and spinal canal: Prevertebral soft tissues are normal. No epidural or paraspinal hematoma. Disc levels: Disc space narrowing at C4-5 and C5-6 with early anterior spurring. Upper chest: Right apical scarring. Other: Negative IMPRESSION: No acute intracranial abnormality.Atrophy, chronic microvascular disease. Mild degenerative disc and facet disease.  No acute findings. Electronically Signed   By: Charlett Nose M.D.   On: 06/07/2016 16:07   Ct Cervical Spine Wo Contrast  Result Date: 06/07/2016 CLINICAL DATA:  Fall.  Pain and swelling at right forehead. EXAM: CT HEAD WITHOUT CONTRAST CT CERVICAL SPINE WITHOUT CONTRAST TECHNIQUE: Multidetector CT imaging of the head and cervical spine was performed following the standard protocol without intravenous contrast. Multiplanar CT image reconstructions of the cervical spine were also generated. COMPARISON:  03/20/2016 FINDINGS: CT HEAD FINDINGS Brain: There is atrophy and chronic small vessel disease changes. No acute intracranial abnormality. Specifically, no hemorrhage, hydrocephalus, mass lesion, acute infarction, or significant intracranial injury. Vascular: No hyperdense vessel or unexpected calcification. Skull: No acute calvarial abnormality. Sinuses/Orbits: Visualized paranasal sinuses and mastoids clear. Orbital soft tissues unremarkable. Other: Soft tissue swelling over the right lateral orbital wall and forehead region. No underlying bony  abnormality. CT CERVICAL SPINE FINDINGS Alignment: Slight anterolisthesis of C3 on C4, stable related to facet disease. Skull base and vertebrae: No fracture. Soft tissues and spinal canal: Prevertebral soft tissues are normal. No epidural or paraspinal hematoma. Disc levels: Disc space narrowing at C4-5 and C5-6 with early anterior spurring. Upper chest: Right apical scarring. Other: Negative IMPRESSION: No acute intracranial abnormality.Atrophy, chronic microvascular disease. Mild degenerative disc and facet disease.  No acute findings. Electronically Signed   By: Charlett Nose M.D.   On: 06/07/2016 16:07   Dg Hand Complete Right  Result Date: 06/07/2016 CLINICAL DATA:  Fall at nursing home with bruising to the right hand and wrist EXAM: RIGHT HAND - COMPLETE 3+ VIEW COMPARISON:  03/20/2016 FINDINGS: Osteopenia is present. Comminuted fracture at the base of the first metacarpal with about 1 bone with of lateral displacement of the distal fracture fragment and approximately 8 mm of overriding. Additional displaced fracture involving the base of the second metacarpal with about 1/4 shaft  diameter of lateral displacement of distal fracture fragment. Additional nondisplaced fractures involving the proximal metaphysis ease of the third and fourth metacarpals. Degenerative changes of the DIP and PIP joints. Vascular calcifications in the soft tissues. IMPRESSION: 1. Comminuted and displaced fracture at the base of the first metacarpal. 2. Mildly comminuted displaced fracture base of the second metacarpal with additional nondisplaced fractures of the proximal third and fourth metacarpals. Please see separately dictated wrist report Electronically Signed   By: Jasmine PangKim  Fujinaga M.D.   On: 06/07/2016 15:52    Procedures Procedures (including critical care time)  Medications Ordered in ED Medications  lidocaine (PF) (XYLOCAINE) 1 % injection 30 mL (30 mLs Infiltration Given by Other 06/07/16 1602)  Tdap (BOOSTRIX)  injection 0.5 mL (0.5 mLs Intramuscular Given 06/07/16 1601)     Initial Impression / Assessment and Plan / ED Course  I have reviewed the triage vital signs and the nursing notes.  Pertinent labs & imaging results that were available during my care of the patient were reviewed by me and considered in my medical decision making (see chart for details).    Laceration not in the area of fracture, so it is not open.  Pt's wound sutured by PA Maczis.  The pt had a volar splint applied by the ortho tech.  Pt has seen Dr. Mina MarbleWeingold in the past for a previous radius fracture and surgical repair.  He is also on call today, so she will be given his number to f/u.    Pt's son is at bedside and the injuries and the need to f/u with hand is explained to him.  Final Clinical Impressions(s) / ED Diagnoses   Final diagnoses:  Fall, initial encounter  Displaced fracture of base of fourth metacarpal bone, right hand, initial encounter for closed fracture  Displaced fracture of base of second metacarpal bone, right hand, initial encounter for closed fracture  Closed displaced fracture of base of first metacarpal bone of right hand, unspecified fracture morphology, initial encounter  Closed displaced fracture of base of fourth metacarpal bone of right hand, initial encounter  Closed fracture of distal end of right radius, unspecified fracture morphology, initial encounter  Laceration of right thumb without foreign body without damage to nail, initial encounter  Contusion of forehead, initial encounter    New Prescriptions New Prescriptions   DOCUSATE SODIUM (COLACE) 100 MG CAPSULE    Take 1 capsule (100 mg total) by mouth every 12 (twelve) hours. Take while on lortab to avoid constipation.   HYDROCODONE-ACETAMINOPHEN (NORCO/VICODIN) 5-325 MG TABLET    Take 1 tablet by mouth every 4 (four) hours as needed.     Jacalyn LefevreHaviland, Cherity Blickenstaff, MD 06/07/16 1730

## 2016-06-07 NOTE — ED Notes (Signed)
Ortho made aware of volar wrist splint ordered.

## 2016-06-07 NOTE — ED Notes (Signed)
ED Provider at bedside. 

## 2016-06-07 NOTE — ED Triage Notes (Signed)
[facility] and c/o pain and swelling to R forehead, pt with swelling to R hand / thumb and laceration at base of R thumb. Bleeding controlled in triage. NH heard patient fall, denied LOC, stating patient fell with walker. Pt with dementia.

## 2016-06-07 NOTE — ED Provider Notes (Signed)
LACERATION REPAIR Performed by: Jacinto HalimMichael M Lemonte Al Authorized by: Jacinto HalimMichael M Daiveon Markman Consent: Verbal consent obtained. Risks and benefits: risks, benefits and alternatives were discussed Consent given by: patient Patient identity confirmed: provided demographic data Prepped and Draped in normal sterile fashion Wound explored  Laceration Location: right thumb  Laceration Length: 3cm  No Foreign Bodies seen or palpated  Anesthesia: local infiltration  Local anesthetic: lidocaine without epinephrine  Anesthetic total: 8 ml  Irrigation method: syringe Amount of cleaning: standard  Skin closure: simple interrupted   Number of sutures: 6  Technique: simple interrupted  Patient tolerance: Patient tolerated the procedure well with no immediate complications.   Princella PellegriniMaczis, Colon Rueth M, PA-C 06/07/16 1724    Jacalyn LefevreHaviland, Julie, MD 06/07/16 Ernestina [id number]

## 2016-06-07 NOTE — Discharge Instructions (Signed)
Sutures need to be removed in 7 to 10 days.

## 2016-06-07 NOTE — ED Notes (Signed)
Patient transported to CT 

## 2016-06-10 ENCOUNTER — Emergency Department (HOSPITAL_COMMUNITY): Payer: Medicare Other

## 2016-06-10 ENCOUNTER — Encounter (HOSPITAL_COMMUNITY): Payer: Self-pay | Admitting: Emergency Medicine

## 2016-06-10 ENCOUNTER — Emergency Department (HOSPITAL_COMMUNITY)
Admission: EM | Admit: 2016-06-10 | Discharge: 2016-06-11 | Disposition: A | Discharge status: Skilled Nursing Facility | Payer: Medicare Other | Attending: Emergency Medicine | Admitting: Emergency Medicine

## 2016-06-10 ENCOUNTER — Other Ambulatory Visit: Payer: Self-pay

## 2016-06-10 DIAGNOSIS — Y939 Activity, unspecified: Secondary | ICD-10-CM | POA: Insufficient documentation

## 2016-06-10 DIAGNOSIS — Z79899 Other long term (current) drug therapy: Secondary | ICD-10-CM | POA: Insufficient documentation

## 2016-06-10 DIAGNOSIS — I10 Essential (primary) hypertension: Secondary | ICD-10-CM | POA: Diagnosis not present

## 2016-06-10 DIAGNOSIS — S0990XA Unspecified injury of head, initial encounter: Secondary | ICD-10-CM

## 2016-06-10 DIAGNOSIS — I2510 Atherosclerotic heart disease of native coronary artery without angina pectoris: Secondary | ICD-10-CM | POA: Diagnosis not present

## 2016-06-10 DIAGNOSIS — J45909 Unspecified asthma, uncomplicated: Secondary | ICD-10-CM | POA: Diagnosis not present

## 2016-06-10 DIAGNOSIS — R296 Repeated falls: Secondary | ICD-10-CM

## 2016-06-10 DIAGNOSIS — S0083XA Contusion of other part of head, initial encounter: Secondary | ICD-10-CM | POA: Insufficient documentation

## 2016-06-10 DIAGNOSIS — W19XXXA Unspecified fall, initial encounter: Secondary | ICD-10-CM | POA: Diagnosis not present

## 2016-06-10 DIAGNOSIS — Y999 Unspecified external cause status: Secondary | ICD-10-CM | POA: Diagnosis not present

## 2016-06-10 DIAGNOSIS — Y9210 Unspecified residential institution as the place of occurrence of the external cause: Secondary | ICD-10-CM | POA: Insufficient documentation

## 2016-06-10 HISTORY — DX: Major depressive disorder, recurrent, unspecified: F33.9

## 2016-06-10 HISTORY — DX: Paroxysmal atrial fibrillation: I48.0

## 2016-06-10 HISTORY — DX: Atherosclerotic heart disease of native coronary artery without angina pectoris: I25.10

## 2016-06-10 NOTE — ED Triage Notes (Addendum)
[facility] with complain of unwitnessed fall. Unknown how long she was down. Per staff, pt yelled for help and they put her back in the bed. No blood thinners. Bruising and swelling noted to face.

## 2016-06-10 NOTE — ED Notes (Signed)
Patient transported to CT 

## 2016-06-10 NOTE — ED Notes (Signed)
Bed: [facility] Expected date:  Expected time:  Means of arrival:  Comments: Fall 

## 2016-06-10 NOTE — ED Notes (Addendum)
[facility] called for transport. Dispatch states there would be a delay in transport for hours

## 2016-06-10 NOTE — ED Provider Notes (Addendum)
[facility]-EMERGENCY DEPT Provider Note: Jessica DellJ. Lane Jaramie Bastos, MD, FACEP  CSN: [id number] MRN: [id number] ARRIVAL: 06/10/16 at 2001 ROOM: WA14/WA14   CHIEF COMPLAINT  Fall  Level V caveat: Dementia HISTORY OF PRESENT ILLNESS  [name] is a 83 y.o. female with a history of dementia and frequent falls. She was sent from her living facility after an unwitnessed fall sometime prior to arrival. The patient herself does not recall the fall. EMS notes a new hematoma to the left forehead. She has a healing hematoma to the right forehead from a previous fall. She also has a splint on her right hand and forearm for a recent fracture. She is not complaining of significant pain. EMS immobilized her cervical spine with a rolled towel prior to arrival. She has not been vomiting.   Past Medical History:  Diagnosis Date  . ALLERGIC RHINITIS 11/09/2006  . Asthma   . ASTHMATIC BRONCHITIS, ACUTE 01/14/2007  . BACK PAIN 03/01/2009  . BLURRED VISION 06/29/2008  . CEREBROVASCULAR ACCIDENT, HX OF 07/24/2006  . CHEST PAIN 09/12/2007  . Chronic cough   . CONSTIPATION 03/01/2009  . CORONARY ARTERY DISEASE 07/24/2006  . Coronary atherosclerosis of native coronary artery   . Cough 02/01/2010  . Degenerative disc disease   . FATIGUE 01/21/2008  . GERD 06/04/2006  . Goiter, unspecified 02/01/2010  . HYPERLIPIDEMIA 12/20/2006  . HYPERTENSION 11/09/2006  . INSOMNIA-SLEEP DISORDER-UNSPEC 11/09/2006  . OSTEOPOROSIS 10/13/2009  . Paroxysmal atrial fibrillation (HCC)   . Recurrent major depression (HCC)   . Senile dementia, uncomplicated 01/06/2009  . Stroke [facility](HCC)     Past Surgical History:  Procedure Laterality Date  . DILATION AND CURETTAGE OF UTERUS      Family History  Problem Relation Age of Onset  . Coronary artery disease Other     Social History  Substance Use Topics  . Smoking status: Never Smoker  . Smokeless tobacco: Never Used  . Alcohol use No    Prior to Admission medications   Medication Sig Start Date  End Date Taking? Authorizing Provider  acetaminophen (TYLENOL) 325 MG tablet Take 650 mg by mouth every 6 (six) hours as needed.   Yes [provider]  Albuterol Sulfate (PROAIR HFA IN) Inhale 2 puffs into the lungs every 4 (four) hours as needed (shortness of breath and wheezing).    Yes [provider]  Calcium Carbonate-Vitamin D (CALCIUM 600/VITAMIN D) 600-400 MG-UNIT per tablet Take 1 tablet by mouth 2 (two) times daily.   Yes [provider]  ciprofloxacin (CIPRO) 500 MG tablet Take 500 mg by mouth 2 (two) times daily.   Yes [provider]  docusate sodium (COLACE) 100 MG capsule Take 1 capsule (100 mg total) by mouth every 12 (twelve) hours. Take while on lortab to avoid constipation. 06/07/16  Yes Jacalyn LefevreHaviland, Julie, MD  donepezil (ARICEPT) 10 MG tablet Take 10 mg by mouth at bedtime. 01/30/11  Yes Corwin LevinsJohn, [name], MD  escitalopram (LEXAPRO) 20 MG tablet Take 20 mg by mouth daily.    Yes [provider]  Fluticasone Furoate-Vilanterol (BREO ELLIPTA) 100-25 MCG/INH AEPB Inhale 1 puff into the lungs daily.   Yes [provider]  guaiFENesin-dextromethorphan (ROBITUSSIN DM) 100-10 MG/5ML syrup Take 5 mLs by mouth every 4 (four) hours as needed for cough. 02/28/15  Yes [name], [name], MD  ketoconazole (NIZORAL) 2 % cream Apply 1 application topically daily as needed for irritation. buttocks   Yes [provider]  loperamide (IMODIUM A-D) 2 MG  tablet Take 2 mg by mouth every 6 (six) hours as needed for diarrhea or loose stools.    Yes [provider]  loratadine (CLARITIN) 10 MG tablet Take 10 mg by mouth daily.   Yes [provider]  LORazepam (ATIVAN) 0.5 MG tablet Take 0.5 mg by mouth every 12 (twelve) hours as needed (agitation).   Yes [provider]  montelukast (SINGULAIR) 10 MG tablet Take 10 mg by mouth daily.  01/29/13  Yes [provider]  Multiple Vitamin (TAB-A-VITE PO) Take 1 tablet by mouth  daily.   Yes [provider]  neomycin-bacitracin-polymyxin (NEOSPORIN) [phone] ointment Apply 1 application topically daily. Apply to right ankle and forehead   Yes [provider]  omeprazole (PRILOSEC) 40 MG capsule Take 40 mg by mouth daily.   Yes [provider]  pravastatin (PRAVACHOL) 40 MG tablet Take 40 mg by mouth at bedtime.    Yes [provider]  Skin Protectants, Misc. (EUCERIN) cream Apply 1 application topically every evening.   Yes [provider]  cephALEXin (KEFLEX) 500 MG capsule Take 1 capsule (500 mg total) by mouth 3 (three) times daily. Patient not taking: Reported on 06/07/2016 03/20/16   Doug Sou, MD  HYDROcodone-acetaminophen (NORCO/VICODIN) 5-325 MG tablet Take 1 tablet by mouth every 4 (four) hours as needed. Patient not taking: Reported on 06/10/2016 06/07/16   Jacalyn Lefevre, MD  Hydrocortisone (GERHARDT'S BUTT CREAM) CREA Apply 1 application topically 3 (three) times daily. Patient not taking: Reported on 03/20/2016 02/28/15   Dorothea Ogle, MD  hydrocortisone 1 % ointment Apply 1 application topically 3 (three) times daily. Patient not taking: Reported on 04/08/2015 02/28/15   Dorothea Ogle, MD    Allergies Patient has no known allergies.   REVIEW OF SYSTEMS     PHYSICAL EXAMINATION  Initial Vital Signs Blood pressure 110/62, pulse 96, resp. rate 17, SpO2 98 %.  Examination General: Well-developed, well-nourished female in no acute distress; appearance consistent with age of record HENT: normocephalic; acute appearing hematoma to left forehead; resolving hematoma to right forehead Eyes: pupils equal, round and reactive to light Neck: Immobilized with rolled towels; no C-spine tenderness Heart: regular rate and rhythm Lungs: clear to auscultation bilaterally Abdomen: soft; nondistended; nontender; bowel sounds present Extremities: No deformity; full range of motion except right wrist and hand immobilized  in splint, ecchymosis of fingers of right hand with brisk capillary refill and motor function intact; pulses normal except right wrist not accessible due to splint; trace edema of lower legs; no tenderness or pain on passive range of motion of left upper extremity, unsplinted part of right upper extremity or bilateral lower extremities Neurologic: Awake, alert; motor function intact in all extremities and symmetric; no facial droop Skin: Warm and dry Psychiatric: Normal mood and affect   RESULTS  Summary of this visit's results, reviewed by myself:   EKG Interpretation  Date/Time:  Saturday June 10 2016 20:46:17 EDT Ventricular Rate:  90 PR Interval:    QRS Duration: 147 QT Interval:  426 QTC Calculation: 522 R Axis:   43 Text Interpretation:  Sinus rhythm Right bundle branch block LVH by voltage Prolonged QT interval Baseline wander in lead(s) V6 Rate is faster Confirmed by Kenshin Splawn ([id number]) on 06/10/2016 9:22:57 PM      Laboratory Studies: No results found for this or any previous visit (from the past 24 hour(s)). Imaging Studies: Ct Head Wo Contrast  Result Date: 06/10/2016 CLINICAL DATA:  83 year old female with with  history of fall with head injury. Bruising and swelling on the face. EXAM: CT HEAD WITHOUT CONTRAST CT CERVICAL SPINE WITHOUT CONTRAST TECHNIQUE: Multidetector CT imaging of the head and cervical spine was performed following the standard protocol without intravenous contrast. Multiplanar CT image reconstructions of the cervical spine were also generated. COMPARISON:  None. FINDINGS: CT HEAD FINDINGS Brain: Mild cerebral atrophy. Patchy and confluent areas of decreased attenuation are noted throughout the deep and periventricular white matter of the cerebral hemispheres bilaterally, compatible with chronic microvascular ischemic disease. No evidence of acute infarction, hemorrhage, hydrocephalus, extra-axial collection or mass lesion/mass effect. Vascular: No hyperdense  vessel or unexpected calcification. Skull: Normal. Negative for fracture or focal lesion. Sinuses/Orbits: No acute finding. Other: Small amount of left-sided supraorbital soft tissue swelling and high attenuation, compatible with a small hematoma in the left frontal scalp. CT CERVICAL SPINE FINDINGS Alignment: 2 mm of anterolisthesis of C3 on C4, similar to prior examinations. Alignment is otherwise normal. Skull base and vertebrae: No acute fracture. No primary bone lesion or focal pathologic process. Soft tissues and spinal canal: No prevertebral fluid or swelling. No visible canal hematoma. Disc levels: Multilevel degenerative disc disease, most pronounced at C4-C5 and C5-C6. Moderate multilevel facet arthropathy. Upper chest: Scarring in the apex of the right upper lobe, similar prior studies. Other: None. IMPRESSION: 1. Left frontal scalp supraorbital soft tissue hematoma. No underlying displaced skull fracture or findings of significant acute traumatic injury to the brain or cervical spine. 2. Mild cerebral atrophy with extensive chronic microvascular ischemic changes in the cerebral white matter, as above. 3. Mild multilevel degenerative disc disease and cervical spondylosis, as above. Electronically Signed   By: Trudie Reed M.D.   On: 06/10/2016 20:44   Ct Cervical Spine Wo Contrast  Result Date: 06/10/2016 CLINICAL DATA:  83 year old female with with history of fall with head injury. Bruising and swelling on the face. EXAM: CT HEAD WITHOUT CONTRAST CT CERVICAL SPINE WITHOUT CONTRAST TECHNIQUE: Multidetector CT imaging of the head and cervical spine was performed following the standard protocol without intravenous contrast. Multiplanar CT image reconstructions of the cervical spine were also generated. COMPARISON:  None. FINDINGS: CT HEAD FINDINGS Brain: Mild cerebral atrophy. Patchy and confluent areas of decreased attenuation are noted throughout the deep and periventricular white matter of the  cerebral hemispheres bilaterally, compatible with chronic microvascular ischemic disease. No evidence of acute infarction, hemorrhage, hydrocephalus, extra-axial collection or mass lesion/mass effect. Vascular: No hyperdense vessel or unexpected calcification. Skull: Normal. Negative for fracture or focal lesion. Sinuses/Orbits: No acute finding. Other: Small amount of left-sided supraorbital soft tissue swelling and high attenuation, compatible with a small hematoma in the left frontal scalp. CT CERVICAL SPINE FINDINGS Alignment: 2 mm of anterolisthesis of C3 on C4, similar to prior examinations. Alignment is otherwise normal. Skull base and vertebrae: No acute fracture. No primary bone lesion or focal pathologic process. Soft tissues and spinal canal: No prevertebral fluid or swelling. No visible canal hematoma. Disc levels: Multilevel degenerative disc disease, most pronounced at C4-C5 and C5-C6. Moderate multilevel facet arthropathy. Upper chest: Scarring in the apex of the right upper lobe, similar prior studies. Other: None. IMPRESSION: 1. Left frontal scalp supraorbital soft tissue hematoma. No underlying displaced skull fracture or findings of significant acute traumatic injury to the brain or cervical spine. 2. Mild cerebral atrophy with extensive chronic microvascular ischemic changes in the cerebral white matter, as above. 3. Mild multilevel degenerative disc disease and cervical spondylosis, as above. Electronically Signed  By: Trudie Reed M.D.   On: 06/10/2016 20:44    ED COURSE  Nursing notes and initial vitals signs, including pulse oximetry, reviewed.  Vitals:   06/10/16 2008 06/10/16 2100  BP:  110/62  Pulse:  96  Resp:  17  SpO2: 96% 98%    PROCEDURES    ED DIAGNOSES     ICD-10-CM   1. Unwitnessed fall R29.6   2. Traumatic hematoma of forehead, initial encounter S00.83XA   3. Minor head injury, initial encounter S09.90XA        Paula Libra, MD 06/10/16 2148      Paula Libra, MD 06/10/16 2154

## 2016-06-11 NOTE — ED Notes (Signed)
Bed: [facility] Expected date: 06/07/16 Expected time: 7:45 PM Means of arrival:  Comments: No Bed

## 2016-08-04 ENCOUNTER — Encounter (HOSPITAL_COMMUNITY): Payer: Self-pay | Admitting: Emergency Medicine

## 2016-08-04 ENCOUNTER — Emergency Department (HOSPITAL_COMMUNITY): Payer: Medicare Other

## 2016-08-04 ENCOUNTER — Emergency Department (HOSPITAL_COMMUNITY)
Admission: EM | Admit: 2016-08-04 | Discharge: 2016-08-04 | Disposition: A | Discharge status: Home / Self Care | Payer: Medicare Other | Attending: Emergency Medicine | Admitting: Emergency Medicine

## 2016-08-04 DIAGNOSIS — Z79899 Other long term (current) drug therapy: Secondary | ICD-10-CM | POA: Diagnosis not present

## 2016-08-04 DIAGNOSIS — I2510 Atherosclerotic heart disease of native coronary artery without angina pectoris: Secondary | ICD-10-CM | POA: Diagnosis not present

## 2016-08-04 DIAGNOSIS — W06XXXA Fall from bed, initial encounter: Secondary | ICD-10-CM | POA: Insufficient documentation

## 2016-08-04 DIAGNOSIS — J45909 Unspecified asthma, uncomplicated: Secondary | ICD-10-CM | POA: Insufficient documentation

## 2016-08-04 DIAGNOSIS — Y92122 Bedroom in nursing home as the place of occurrence of the external cause: Secondary | ICD-10-CM | POA: Diagnosis not present

## 2016-08-04 DIAGNOSIS — Y939 Activity, unspecified: Secondary | ICD-10-CM | POA: Insufficient documentation

## 2016-08-04 DIAGNOSIS — S0990XA Unspecified injury of head, initial encounter: Secondary | ICD-10-CM | POA: Insufficient documentation

## 2016-08-04 DIAGNOSIS — I10 Essential (primary) hypertension: Secondary | ICD-10-CM | POA: Diagnosis not present

## 2016-08-04 DIAGNOSIS — Y999 Unspecified external cause status: Secondary | ICD-10-CM | POA: Insufficient documentation

## 2016-08-04 DIAGNOSIS — W19XXXA Unspecified fall, initial encounter: Secondary | ICD-10-CM

## 2016-08-04 NOTE — ED Notes (Signed)
Ice pack placed on site

## 2016-08-04 NOTE — Discharge Instructions (Signed)
It was my pleasure taking care of you today!   You had a CT of your head and neck today showing no injury from your fall.  Ice to affected area as needed.   Return to ER for new or worsening symptoms, any additional concerns.

## 2016-08-04 NOTE — ED Triage Notes (Signed)
[facility], pt fell out of bed, small hematoma to left back of head. Pt hx of dementia is normal  Baseline verbalized by staff/ ems. Pt is DNR,  Vs 142/80, hr 78. rr 16, spo2 94 room air.

## 2016-08-04 NOTE — ED Provider Notes (Signed)
[facility]-EMERGENCY DEPT Provider Note   CSN: [id number] Arrival date & time: 08/04/16  0607     History   Chief Complaint Chief Complaint  Patient presents with  . Fall    hematoma to left rear ( head )     HPI [name] is a 83 y.o. female.  The history is provided by the patient, medical records and the nursing home. No language interpreter was used.  Fall    [name] is a 83 y.o. female  with a PMH of dementia, HTN, HLD, CAD, prior stroke who presents to the Emergency Department from [facility] after falling out of the bed this morning. Per facility, patient fell backwards, striking the back of her head. Baseline mental status per staff. No meds given.   Level V caveat applies 2/2 dementia.  Past Medical History:  Diagnosis Date  . ALLERGIC RHINITIS 11/09/2006  . Asthma   . ASTHMATIC BRONCHITIS, ACUTE 01/14/2007  . BACK PAIN 03/01/2009  . BLURRED VISION 06/29/2008  . CEREBROVASCULAR ACCIDENT, HX OF 07/24/2006  . CHEST PAIN 09/12/2007  . Chronic cough   . CONSTIPATION 03/01/2009  . CORONARY ARTERY DISEASE 07/24/2006  . Coronary atherosclerosis of native coronary artery   . Cough 02/01/2010  . Degenerative disc disease   . FATIGUE 01/21/2008  . GERD 06/04/2006  . Goiter, unspecified 02/01/2010  . HYPERLIPIDEMIA 12/20/2006  . HYPERTENSION 11/09/2006  . INSOMNIA-SLEEP DISORDER-UNSPEC 11/09/2006  . OSTEOPOROSIS 10/13/2009  . Paroxysmal atrial fibrillation (HCC)   . Recurrent major depression (HCC)   . Senile dementia, uncomplicated 01/06/2009  . Stroke [facility])     Patient Active Problem List   Diagnosis Date Noted  . Hypokalemia 02/26/2015  . Anemia of chronic disease 02/26/2015  . Fall 02/25/2015  . Forehead laceration 02/25/2015  . UTI (lower urinary tract infection) 02/25/2015  . Senile dementia, uncomplicated 01/06/2009    Past Surgical History:  Procedure Laterality Date  . DILATION AND CURETTAGE OF UTERUS      OB History    No data available        Home Medications    Prior to Admission medications   Medication Sig Start Date End Date Taking? Authorizing Provider  acetaminophen (TYLENOL) 325 MG tablet Take 650 mg by mouth every 6 (six) hours as needed for mild pain or moderate pain.    Yes [provider]  albuterol (PROVENTIL HFA;VENTOLIN HFA) 108 (90 Base) MCG/ACT inhaler Inhale 2 puffs into the lungs every 4 (four) hours as needed for wheezing or shortness of breath.   Yes [provider]  Calcium Carbonate-Vitamin D (CALCIUM 600/VITAMIN D) 600-400 MG-UNIT per tablet Take 1 tablet by mouth 2 (two) times daily.   Yes [provider]  docusate sodium (COLACE) 100 MG capsule Take 1 capsule (100 mg total) by mouth every 12 (twelve) hours. Take while on lortab to avoid constipation. 06/07/16  Yes Jacalyn Lefevre, MD  donepezil (ARICEPT) 10 MG tablet Take 10 mg by mouth at bedtime.   Yes Corwin Levins, MD  escitalopram (LEXAPRO) 20 MG tablet Take 20 mg by mouth daily.    Yes [provider]  Fluticasone Furoate-Vilanterol (BREO ELLIPTA) 100-25 MCG/INH AEPB Inhale 1 puff into the lungs daily.   Yes [provider]  ketoconazole (NIZORAL) 2 % cream Apply 1 application topically every 12 (twelve) hours as needed for irritation (and/or redness). buttocks    Yes [provider]  loperamide (IMODIUM) 2 MG capsule Take 2 mg by  mouth every 6 (six) hours as needed for diarrhea or loose stools.   Yes [provider]  loratadine (CLARITIN) 10 MG tablet Take 10 mg by mouth daily.   Yes [provider]  LORazepam (ATIVAN) 0.5 MG tablet Take 0.5 mg by mouth every 12 (twelve) hours as needed (for agitation).    Yes [provider]  montelukast (SINGULAIR) 10 MG tablet Take 10 mg by mouth daily.    Yes [provider]  Multiple Vitamin (TAB-A-VITE PO) Take 1 tablet by mouth daily.   Yes [provider]  Nutritional Supplements (NUTRITIONAL SUPPLEMENT PLUS)  LIQD Take 1 Bottle by mouth 3 (three) times daily.   Yes [provider]  omeprazole (PRILOSEC) 40 MG capsule Take 40 mg by mouth daily.   Yes [provider]  pravastatin (PRAVACHOL) 40 MG tablet Take 40 mg by mouth at bedtime.    Yes [provider]  Skin Protectants, Misc. (EUCERIN) cream Apply 1 application topically every evening.   Yes [provider]  Zinc Oxide (DESITIN) 13 % CREA Apply 1 application topically every 8 (eight) hours as needed (for redness/chaffing on buttocks).   Yes [provider]    Family History Family History  Problem Relation Age of Onset  . Coronary artery disease Other     Social History Social History  Substance Use Topics  . Smoking status: Never Smoker  . Smokeless tobacco: Never Used  . Alcohol use No     Allergies   Patient has no known allergies.   Review of Systems Review of Systems  Unable to perform ROS: Dementia     Physical Exam Updated Vital Signs BP 134/65 (BP Location: Left Arm)   Pulse 75   Temp 97.9 F (36.6 C) (Oral)   Resp 18   SpO2 97%   Physical Exam  Constitutional: She appears well-developed and well-nourished. No distress.  HENT:  Head: Normocephalic.    Cardiovascular: Regular rhythm and normal heart sounds.   No murmur heard. Pulmonary/Chest: Effort normal and breath sounds normal. No respiratory distress. She has no wheezes. She has no rales. She exhibits no tenderness.  Abdominal: Soft. She exhibits no distension. There is no tenderness.  Musculoskeletal:  No tenderness to palpation of upper or lower extremities, C/T/L spine, hips/pelvis or ribcage.  Neurological: She is alert.  Alert, oriented. Speech clear. Able to follow commands. Moves all extremities well. Cranial Nerves II-XII grossly intact. Sensory to light touch normal in all four extremities.  Skin: Skin is warm and dry.  Nursing note and vitals reviewed.    ED Treatments / Results  Labs (all  labs ordered are listed, but only abnormal results are displayed) Labs Reviewed - No data to display  EKG  EKG Interpretation None       Radiology Ct Head Wo Contrast  Result Date: 08/04/2016 CLINICAL DATA:  Pain following fall EXAM: CT HEAD WITHOUT CONTRAST CT CERVICAL SPINE WITHOUT CONTRAST TECHNIQUE: Multidetector CT imaging of the head and cervical spine was performed following the standard protocol without intravenous contrast. Multiplanar CT image reconstructions of the cervical spine were also generated. COMPARISON:  CT head and CT cervical spine June 10, 2016 FINDINGS: CT HEAD FINDINGS Brain: Mild diffuse atrophy is stable. There is no intracranial mass, hemorrhage, extra-axial fluid collection, or midline shift. There is small vessel disease in the centra semiovale bilaterally. Small vessel disease is noted in the anterior limbs of the left internal and external capsules. There is a slightly lesser  degree of small vessel disease in the anterior limbs of the right internal and external capsules. There is evidence of a prior small infarct in the right thalamus. Small vessel disease is noted in each thalamus. There is no new gray-white compartment lesion. No acute infarct evident. Vascular: There is no hyperdense vessel. There is calcification in the right vertebral artery as well as in both carotid siphon regions. Skull: Bony calvarium appears intact. Sinuses/Orbits: There is mucosal thickening in several ethmoid air cells bilaterally. Other visualized paranasal sinuses are clear. Frontal sinuses are somewhat hypoplastic. No orbital lesions are identified. Scleral calcification is noted bilaterally, symmetric. Other: Mastoid air cells are clear. CT CERVICAL SPINE FINDINGS Alignment: There is 2 mm of anterolisthesis of C3 on C4, stable. No other spondylolisthesis. Skull base and vertebrae: The craniocervical junction and skull base regions appear normal. No evident fracture. No blastic or lytic bone  lesions. Soft tissues and spinal canal: Prevertebral soft tissues and predental space regions are normal. There is no paraspinous lesion. No cord or canal hematoma evident. Disc levels: There is moderate disc space narrowing at C4-5 and C5-6. There is slight disc space narrowing at C3-4 and C6-7. There is facet osteoarthritic change at multiple levels. There is moderate bony overgrowth at C3-4 on the left at the exit foraminal level. No disc extrusion or stenosis evident. Upper chest: Visualized upper lung zones are clear. There is aortic atherosclerosis in the arch region. Other: There is bilateral carotid artery calcification. IMPRESSION: CT head: Atrophy with stable patchy supratentorial small vessel disease. Prior small infarct right thalamus. No acute infarct evident. No intracranial mass, hemorrhage, or extra-axial fluid collection. Areas of arteriovascular calcification noted. Mild ethmoid sinus disease present. CT cervical spine: No fracture. Stable slight anterolisthesis at C3-4, likely due to underlying spondylosis. There is osteoarthritic change at several levels. There is aortic atherosclerosis as well as bilateral carotid artery calcification. Aortic Atherosclerosis (ICD10-I70.0). Electronically Signed   By: Bretta Bang III M.D.   On: 08/04/2016 07:18   Ct Cervical Spine Wo Contrast  Result Date: 08/04/2016 CLINICAL DATA:  Pain following fall EXAM: CT HEAD WITHOUT CONTRAST CT CERVICAL SPINE WITHOUT CONTRAST TECHNIQUE: Multidetector CT imaging of the head and cervical spine was performed following the standard protocol without intravenous contrast. Multiplanar CT image reconstructions of the cervical spine were also generated. COMPARISON:  CT head and CT cervical spine June 10, 2016 FINDINGS: CT HEAD FINDINGS Brain: Mild diffuse atrophy is stable. There is no intracranial mass, hemorrhage, extra-axial fluid collection, or midline shift. There is small vessel disease in the centra semiovale  bilaterally. Small vessel disease is noted in the anterior limbs of the left internal and external capsules. There is a slightly lesser degree of small vessel disease in the anterior limbs of the right internal and external capsules. There is evidence of a prior small infarct in the right thalamus. Small vessel disease is noted in each thalamus. There is no new gray-white compartment lesion. No acute infarct evident. Vascular: There is no hyperdense vessel. There is calcification in the right vertebral artery as well as in both carotid siphon regions. Skull: Bony calvarium appears intact. Sinuses/Orbits: There is mucosal thickening in several ethmoid air cells bilaterally. Other visualized paranasal sinuses are clear. Frontal sinuses are somewhat hypoplastic. No orbital lesions are identified. Scleral calcification is noted bilaterally, symmetric. Other: Mastoid air cells are clear. CT CERVICAL SPINE FINDINGS Alignment: There is 2 mm of anterolisthesis of C3 on C4, stable. No other spondylolisthesis. Skull base and  vertebrae: The craniocervical junction and skull base regions appear normal. No evident fracture. No blastic or lytic bone lesions. Soft tissues and spinal canal: Prevertebral soft tissues and predental space regions are normal. There is no paraspinous lesion. No cord or canal hematoma evident. Disc levels: There is moderate disc space narrowing at C4-5 and C5-6. There is slight disc space narrowing at C3-4 and C6-7. There is facet osteoarthritic change at multiple levels. There is moderate bony overgrowth at C3-4 on the left at the exit foraminal level. No disc extrusion or stenosis evident. Upper chest: Visualized upper lung zones are clear. There is aortic atherosclerosis in the arch region. Other: There is bilateral carotid artery calcification. IMPRESSION: CT head: Atrophy with stable patchy supratentorial small vessel disease. Prior small infarct right thalamus. No acute infarct evident. No  intracranial mass, hemorrhage, or extra-axial fluid collection. Areas of arteriovascular calcification noted. Mild ethmoid sinus disease present. CT cervical spine: No fracture. Stable slight anterolisthesis at C3-4, likely due to underlying spondylosis. There is osteoarthritic change at several levels. There is aortic atherosclerosis as well as bilateral carotid artery calcification. Aortic Atherosclerosis (ICD10-I70.0). Electronically Signed   By: Bretta BangWilliam  Woodruff III M.D.   On: 08/04/2016 07:18    Procedures Procedures (including critical care time)  Medications Ordered in ED Medications - No data to display   Initial Impression / Assessment and Plan / ED Course  I have reviewed the triage vital signs and the nursing notes.  Pertinent labs & imaging results that were available during my care of the patient were reviewed by me and considered in my medical decision making (see chart for details).    [name] is a 83 y.o. female who presents to ED for evaluation after fall at nursing facility. Hematoma to the back of the head with no open wounds. No focal neuro deficits. Baseline mental status per facility. No other tenderness to palpation. CT head/cervical negative. Will discharge back to facility in satisfactory condition.   Patient seen by and discussed with Dr. Nicanor AlconPalumbo who agrees with treatment plan.    Final Clinical Impressions(s) / ED Diagnoses   Final diagnoses:  Injury of head, initial encounter  Fall, initial encounter    New Prescriptions New Prescriptions   No medications on file     Ward, Chase PicketJaime Pilcher, PA-C 08/04/16 [phone]    Palumbo, April, MD 08/04/16 [phone]

## 2016-08-04 NOTE — ED Notes (Signed)
Bed: [facility] Expected date:  Expected time:  Means of arrival:  Comments: 83 yr old from memory care patient/yelling

## 2016-08-11 ENCOUNTER — Emergency Department (HOSPITAL_COMMUNITY)
Admission: EM | Admit: 2016-08-11 | Discharge: 2016-08-11 | Disposition: A | Discharge status: Home / Self Care | Payer: Medicare Other | Attending: Emergency Medicine | Admitting: Emergency Medicine

## 2016-08-11 DIAGNOSIS — W1830XA Fall on same level, unspecified, initial encounter: Secondary | ICD-10-CM | POA: Insufficient documentation

## 2016-08-11 DIAGNOSIS — Z7901 Long term (current) use of anticoagulants: Secondary | ICD-10-CM | POA: Insufficient documentation

## 2016-08-11 DIAGNOSIS — I10 Essential (primary) hypertension: Secondary | ICD-10-CM | POA: Diagnosis not present

## 2016-08-11 DIAGNOSIS — J45909 Unspecified asthma, uncomplicated: Secondary | ICD-10-CM | POA: Diagnosis not present

## 2016-08-11 DIAGNOSIS — F0390 Unspecified dementia without behavioral disturbance: Secondary | ICD-10-CM | POA: Insufficient documentation

## 2016-08-11 DIAGNOSIS — Z043 Encounter for examination and observation following other accident: Secondary | ICD-10-CM | POA: Insufficient documentation

## 2016-08-11 DIAGNOSIS — Z79899 Other long term (current) drug therapy: Secondary | ICD-10-CM | POA: Diagnosis not present

## 2016-08-11 DIAGNOSIS — Y92129 Unspecified place in nursing home as the place of occurrence of the external cause: Secondary | ICD-10-CM | POA: Diagnosis not present

## 2016-08-11 DIAGNOSIS — I2510 Atherosclerotic heart disease of native coronary artery without angina pectoris: Secondary | ICD-10-CM | POA: Diagnosis not present

## 2016-08-11 DIAGNOSIS — W19XXXA Unspecified fall, initial encounter: Secondary | ICD-10-CM

## 2016-08-11 NOTE — Discharge Instructions (Signed)
As discussed, your evaluation today has been largely reassuring.  But, it is important that you monitor your condition carefully, and do not hesitate to return to the ED if you develop new, or concerning changes in your condition. ? ?Otherwise, please follow-up with your physician for appropriate ongoing care. ? ?

## 2016-08-11 NOTE — ED Triage Notes (Signed)
Per EMS, pt is coming [facility] after staff reported an unwitnessed fall. Per EMS pt has no verbal complaints to pain. EMS reports bruising to the left palm. Pt is alert to self and has a hx dementia. Pt usually ambulates with a walker.

## 2016-08-11 NOTE — ED Provider Notes (Signed)
[facility]-EMERGENCY DEPT Provider Note   CSN: [id number] Arrival date & time: 08/11/16  1701     History   Chief Complaint Chief Complaint  Patient presents with  . Fall    HPI [name] is a 83 y.o. female.  HPI Elderly female presents after a fall. It is unclear if this witnessed or not, as the patient has dementia. However, the patient denies any complaints, states that she is ready to go home. She denies any weakness anywhere, any pain. Report, she should have a fall, but it is unclear what the circumstances were. Patient is on Xarelto, as well as multiple other medication per    Past Medical History:  Diagnosis Date  . ALLERGIC RHINITIS 11/09/2006  . Asthma   . ASTHMATIC BRONCHITIS, ACUTE 01/14/2007  . BACK PAIN 03/01/2009  . BLURRED VISION 06/29/2008  . CEREBROVASCULAR ACCIDENT, HX OF 07/24/2006  . CHEST PAIN 09/12/2007  . Chronic cough   . CONSTIPATION 03/01/2009  . CORONARY ARTERY DISEASE 07/24/2006  . Coronary atherosclerosis of native coronary artery   . Cough 02/01/2010  . Degenerative disc disease   . FATIGUE 01/21/2008  . GERD 06/04/2006  . Goiter, unspecified 02/01/2010  . HYPERLIPIDEMIA 12/20/2006  . HYPERTENSION 11/09/2006  . INSOMNIA-SLEEP DISORDER-UNSPEC 11/09/2006  . OSTEOPOROSIS 10/13/2009  . Paroxysmal atrial fibrillation (HCC)   . Recurrent major depression (HCC)   . Senile dementia, uncomplicated 01/06/2009  . Stroke [facility](HCC)     Patient Active Problem List   Diagnosis Date Noted  . Hypokalemia 02/26/2015  . Anemia of chronic disease 02/26/2015  . Fall 02/25/2015  . Forehead laceration 02/25/2015  . UTI (lower urinary tract infection) 02/25/2015  . Senile dementia, uncomplicated 01/06/2009    Past Surgical History:  Procedure Laterality Date  . DILATION AND CURETTAGE OF UTERUS      OB History    No data available       Home Medications    Prior to Admission medications   Medication Sig Start Date End Date Taking? Authorizing Provider    acetaminophen (TYLENOL) 325 MG tablet Take 650 mg by mouth every 6 (six) hours as needed for mild pain or moderate pain.    Yes [provider]  albuterol (PROVENTIL HFA;VENTOLIN HFA) 108 (90 Base) MCG/ACT inhaler Inhale 2 puffs into the lungs every 4 (four) hours as needed for wheezing or shortness of breath.   Yes [provider]  Calcium Carbonate-Vitamin D (CALCIUM 600/VITAMIN D) 600-400 MG-UNIT per tablet Take 1 tablet by mouth 2 (two) times daily.   Yes [provider]  docusate sodium (COLACE) 100 MG capsule Take 1 capsule (100 mg total) by mouth every 12 (twelve) hours. Take while on lortab to avoid constipation. 06/07/16  Yes Jacalyn LefevreHaviland, Julie, MD  donepezil (ARICEPT) 10 MG tablet Take 10 mg by mouth at bedtime.   Yes Corwin LevinsJohn, [name], MD  escitalopram (LEXAPRO) 20 MG tablet Take 20 mg by mouth daily.    Yes [provider]  Fluticasone Furoate-Vilanterol (BREO ELLIPTA) 100-25 MCG/INH AEPB Inhale 1 puff into the lungs daily.   Yes [provider]  HYDROcodone-acetaminophen (NORCO/VICODIN) 5-325 MG tablet Take 1 tablet by mouth every 4 (four) hours as needed for moderate pain.   Yes [provider]  ketoconazole (NIZORAL) 2 % cream Apply 1 application topically every 12 (twelve) hours as needed for irritation (and/or redness). buttocks    Yes [provider]  loperamide (IMODIUM) 2 MG capsule Take 2 mg by mouth every 6 (  six) hours as needed for diarrhea or loose stools.   Yes [provider]  loratadine (CLARITIN) 10 MG tablet Take 10 mg by mouth daily.   Yes [provider]  LORazepam (ATIVAN) 0.5 MG tablet Take 0.5 mg by mouth every 12 (twelve) hours as needed (for agitation).    Yes [provider]  montelukast (SINGULAIR) 10 MG tablet Take 10 mg by mouth daily.    Yes [provider]  Multiple Vitamin (TAB-A-VITE PO) Take 1 tablet by mouth daily.   Yes [provider]  Nutritional  Supplements (NUTRITIONAL SUPPLEMENT PLUS) LIQD Take 1 Bottle by mouth 3 (three) times daily.   Yes [provider]  omeprazole (PRILOSEC) 40 MG capsule Take 40 mg by mouth daily.   Yes [provider]  pravastatin (PRAVACHOL) 40 MG tablet Take 40 mg by mouth at bedtime.    Yes [provider]  Skin Protectants, Misc. (EUCERIN) cream Apply 1 application topically every evening.   Yes [provider]  Zinc Oxide (DESITIN) 13 % CREA Apply 1 application topically every 8 (eight) hours as needed (for redness/chaffing on buttocks).   Yes [provider]    Family History Family History  Problem Relation Age of Onset  . Coronary artery disease Other     Social History Social History  Substance Use Topics  . Smoking status: Never Smoker  . Smokeless tobacco: Never Used  . Alcohol use No     Allergies   Patient has no known allergies.   Review of Systems Review of Systems  Unable to perform ROS: Dementia     Physical Exam Updated Vital Signs BP (!) 132/56 (BP Location: Right Arm)   Pulse 64   Temp 98.3 F (36.8 C) (Oral)   Resp 16   SpO2 98%   Physical Exam  Constitutional: She appears well-developed and well-nourished. No distress.  HENT:  Head: Normocephalic and atraumatic.  Eyes: Conjunctivae and EOM are normal.  Neck: Full passive range of motion without pain. Neck supple. No spinous process tenderness and no muscular tenderness present. Normal range of motion present.  Cardiovascular: Normal rate and regular rhythm.   Pulmonary/Chest: Effort normal and breath sounds normal. No stridor. No respiratory distress.  Abdominal: She exhibits no distension.  Musculoskeletal: She exhibits no edema.  Neurological: She is alert. She displays no atrophy and no tremor. No cranial nerve deficit or sensory deficit. She exhibits normal muscle tone.  Patient walks with a walker, without complication, she moves quickly, without discomfort.     Skin: Skin is warm and dry.  Psychiatric: She has a normal mood and affect. Cognition and memory are impaired.  Nursing note and vitals reviewed.    ED Treatments / Results   Procedures Procedures (including critical care time)    Initial Impression / Assessment and Plan / ED Course  I have reviewed the triage vital signs and the nursing notes.  Pertinent labs & imaging results that were available during my care of the patient were reviewed by me and considered in my medical decision making (see chart for details).  Elderly female presents after a possible fall from her nursing facility. Here she is awake, alert, though has evidence of dementia, his otherwise unremarkable neurologic exam, with no loss of ambulatory capacity, no weakness in any extremity, follows commands reliably. No neck pain. No imaging indicated given the absence of complaints, absence of neurologic deficits, and no change after monitoring in the emergency department.   Final  Clinical Impressions(s) / ED Diagnoses   Final diagnoses:  Fall, initial encounter    New Prescriptions New Prescriptions   No medications on file     Gerhard Munch, MD 08/12/16 0004

## 2016-08-11 NOTE — ED Notes (Signed)
[facility] Expected date:  Expected time:  Means of arrival:  Comments: EMS 83 y/o fall, dementia

## 2017-01-26 ENCOUNTER — Encounter (HOSPITAL_COMMUNITY): Payer: Self-pay | Admitting: Emergency Medicine

## 2017-01-26 ENCOUNTER — Emergency Department (HOSPITAL_COMMUNITY): Payer: Medicare Other

## 2017-01-26 ENCOUNTER — Emergency Department (HOSPITAL_COMMUNITY)
Admission: EM | Admit: 2017-01-26 | Discharge: 2017-01-26 | Disposition: A | Discharge status: Home / Self Care | Payer: Medicare Other | Attending: Emergency Medicine | Admitting: Emergency Medicine

## 2017-01-26 DIAGNOSIS — J45909 Unspecified asthma, uncomplicated: Secondary | ICD-10-CM | POA: Diagnosis not present

## 2017-01-26 DIAGNOSIS — W19XXXA Unspecified fall, initial encounter: Secondary | ICD-10-CM | POA: Insufficient documentation

## 2017-01-26 DIAGNOSIS — Y929 Unspecified place or not applicable: Secondary | ICD-10-CM | POA: Insufficient documentation

## 2017-01-26 DIAGNOSIS — I2510 Atherosclerotic heart disease of native coronary artery without angina pectoris: Secondary | ICD-10-CM | POA: Diagnosis not present

## 2017-01-26 DIAGNOSIS — S0993XA Unspecified injury of face, initial encounter: Secondary | ICD-10-CM | POA: Diagnosis present

## 2017-01-26 DIAGNOSIS — I10 Essential (primary) hypertension: Secondary | ICD-10-CM | POA: Diagnosis not present

## 2017-01-26 DIAGNOSIS — Z79899 Other long term (current) drug therapy: Secondary | ICD-10-CM | POA: Diagnosis not present

## 2017-01-26 DIAGNOSIS — Y999 Unspecified external cause status: Secondary | ICD-10-CM | POA: Diagnosis not present

## 2017-01-26 DIAGNOSIS — Z7902 Long term (current) use of antithrombotics/antiplatelets: Secondary | ICD-10-CM | POA: Diagnosis not present

## 2017-01-26 DIAGNOSIS — F0390 Unspecified dementia without behavioral disturbance: Secondary | ICD-10-CM | POA: Diagnosis not present

## 2017-01-26 DIAGNOSIS — S0083XA Contusion of other part of head, initial encounter: Secondary | ICD-10-CM | POA: Diagnosis not present

## 2017-01-26 DIAGNOSIS — Y939 Activity, unspecified: Secondary | ICD-10-CM | POA: Insufficient documentation

## 2017-01-26 NOTE — ED Provider Notes (Signed)
[facility]-EMERGENCY DEPT Provider Note   CSN: [id number] Arrival date & time: 01/26/17  1026     History   Chief Complaint Chief Complaint  Patient presents with  . Fall    HPI BRAIDEN PRESUTTI is a 84 y.o. female.  HPI  Patient presents from a nursing facility after an unwitnessed presumed fall. The patient has dementia, cannot provide details of the event, level 5 caveat. However, the patient does answer simple questions seemingly appropriately, and currently denies pain, states that she feels okay. She denies any dyspnea, nausea. Per report the patient was found with facial lesions, in her bed with multiple facial and head lesions. No other reports of new changes from mental status, or other concerns.    Past Medical History:  Diagnosis Date  . ALLERGIC RHINITIS 11/09/2006  . Asthma   . ASTHMATIC BRONCHITIS, ACUTE 01/14/2007  . BACK PAIN 03/01/2009  . BLURRED VISION 06/29/2008  . CEREBROVASCULAR ACCIDENT, HX OF 07/24/2006  . CHEST PAIN 09/12/2007  . Chronic cough   . CONSTIPATION 03/01/2009  . CORONARY ARTERY DISEASE 07/24/2006  . Coronary atherosclerosis of native coronary artery   . Cough 02/01/2010  . Degenerative disc disease   . FATIGUE 01/21/2008  . GERD 06/04/2006  . Goiter, unspecified 02/01/2010  . HYPERLIPIDEMIA 12/20/2006  . HYPERTENSION 11/09/2006  . INSOMNIA-SLEEP DISORDER-UNSPEC 11/09/2006  . OSTEOPOROSIS 10/13/2009  . Paroxysmal atrial fibrillation (HCC)   . Recurrent major depression (HCC)   . Senile dementia, uncomplicated 01/06/2009  . Stroke [facility])     Patient Active Problem List   Diagnosis Date Noted  . Hypokalemia 02/26/2015  . Anemia of chronic disease 02/26/2015  . Fall 02/25/2015  . Forehead laceration 02/25/2015  . UTI (lower urinary tract infection) 02/25/2015  . Senile dementia, uncomplicated 01/06/2009    Past Surgical History:  Procedure Laterality Date  . DILATION AND CURETTAGE OF UTERUS      OB History    No  data available       Home Medications    Prior to Admission medications   Medication Sig Start Date End Date Taking? Authorizing Provider  acetaminophen (TYLENOL) 325 MG tablet Take 650 mg by mouth every 6 (six) hours as needed for mild pain or moderate pain.    Yes [provider]  albuterol (PROVENTIL HFA;VENTOLIN HFA) 108 (90 Base) MCG/ACT inhaler Inhale 2 puffs into the lungs every 4 (four) hours as needed for wheezing or shortness of breath.   Yes [provider]  Calcium Carbonate-Vitamin D (CALCIUM 600/VITAMIN D) 600-400 MG-UNIT per tablet Take 1 tablet by mouth 2 (two) times daily.   Yes [provider]  docusate sodium (COLACE) 100 MG capsule Take 1 capsule (100 mg total) by mouth every 12 (twelve) hours. Take while on lortab to avoid constipation. 06/07/16  Yes Jacalyn Lefevre, MD  donepezil (ARICEPT) 10 MG tablet Take 10 mg by mouth at bedtime.   Yes Corwin Levins, MD  Ergocalciferol 2000 units TABS Take 1 tablet by mouth daily.   Yes [provider]  escitalopram (LEXAPRO) 20 MG tablet Take 20 mg by mouth daily.    Yes [provider]  Fluticasone Furoate-Vilanterol (BREO ELLIPTA) 100-25 MCG/INH AEPB Inhale 1 puff into the lungs daily.   Yes [provider]  HYDROcodone-acetaminophen (NORCO/VICODIN) 5-325 MG tablet Take 1 tablet by mouth every 4 (four) hours as needed for moderate pain.   Yes [provider]  ketoconazole (NIZORAL) 2 % cream Apply 1 application  topically every 12 (twelve) hours as needed for irritation (and/or redness). buttocks    Yes [provider]  loperamide (IMODIUM) 2 MG capsule Take 2 mg by mouth every 6 (six) hours as needed for diarrhea or loose stools.   Yes [provider]  loratadine (CLARITIN) 10 MG tablet Take 10 mg by mouth daily.   Yes [provider]  LORazepam (ATIVAN) 0.5 MG tablet Take 0.5 mg by mouth every 12 (twelve) hours as needed (for agitation).    Yes  [provider]  montelukast (SINGULAIR) 10 MG tablet Take 10 mg by mouth daily.    Yes [provider]  Multiple Vitamin (TAB-A-VITE PO) Take 1 tablet by mouth daily.   Yes [provider]  Nutritional Supplements (NUTRITIONAL SUPPLEMENT PLUS) LIQD Take 1 Bottle by mouth 3 (three) times daily.   Yes [provider]  omeprazole (PRILOSEC) 40 MG capsule Take 40 mg by mouth daily.   Yes [provider]  ondansetron (ZOFRAN) 4 MG tablet Take 4 mg by mouth every 4 (four) hours as needed for nausea or vomiting.   Yes [provider]  pravastatin (PRAVACHOL) 40 MG tablet Take 40 mg by mouth at bedtime.    Yes [provider]  Skin Protectants, Misc. (EUCERIN) cream Apply 1 application topically every evening.   Yes [provider]  triamcinolone cream (KENALOG) 0.1 % Apply 1 application topically 2 (two) times daily as needed (for itching on arms and glutes).   Yes [provider]  Zinc Oxide (DESITIN) 13 % CREA Apply 1 application topically every 8 (eight) hours as needed (for redness/chaffing on buttocks).   Yes [provider]    Family History Family History  Problem Relation Age of Onset  . Coronary artery disease Other     Social History Social History   Tobacco Use  . Smoking status: Never Smoker  . Smokeless tobacco: Never Used  Substance Use Topics  . Alcohol use: No  . Drug use: No     Allergies   Patient has no known allergies.   Review of Systems Review of Systems  Unable to perform ROS: Dementia     Physical Exam Updated Vital Signs BP (!) 123/51   Pulse 66   Temp 97.9 F (36.6 C) (Oral)   Resp 15   SpO2 96%   Physical Exam  Constitutional: She appears well-developed and well-nourished. No distress.  Elderly female withdrawn, but interactive when spoken to directly.  HENT:  Head: Normocephalic.    Eyes: Conjunctivae and EOM are normal.  Neck: No spinous process  tenderness and no muscular tenderness present. No neck rigidity. Decreased range of motion present. No edema and no erythema present.  Cardiovascular: Normal rate and regular rhythm.  Pulmonary/Chest: Effort normal and breath sounds normal. No stridor. No respiratory distress.  Abdominal: She exhibits no distension.  Musculoskeletal: She exhibits no edema.  Neurological: She is alert. She displays atrophy. She displays no tremor. No cranial nerve deficit. She exhibits normal muscle tone. She displays no seizure activity.  Speech is brief, appropriate with specific questions, otherwise patient is essentially noninteractive.  Skin: Skin is warm and dry.  Psychiatric: She is slowed and withdrawn. Cognition and memory are impaired.  Nursing note and vitals reviewed.    ED Treatments / Results  Labs (all labs ordered are listed, but only abnormal results are displayed) Labs Reviewed - No data to display  EKG  EKG Interpretation None  Radiology Ct Head Wo Contrast  Result Date: 01/26/2017 CLINICAL DATA:  Poly trauma, found patient in bed with a hematoma on right eye and an abrasion to left bottom of head-unsure if patient fell-patient has a history of dementia and can not recall incident. EXAM: CT HEAD WITHOUT CONTRAST CT MAXILLOFACIAL WITHOUT CONTRAST CT CERVICAL SPINE WITHOUT CONTRAST TECHNIQUE: Multidetector CT imaging of the head, cervical spine, and maxillofacial structures were performed using the standard protocol without intravenous contrast. Multiplanar CT image reconstructions of the cervical spine and maxillofacial structures were also generated. COMPARISON:  08/04/2016 FINDINGS: CT HEAD FINDINGS Brain: No evidence of acute infarction, hemorrhage, extra-axial collection, ventriculomegaly, or mass effect. Generalized cerebral atrophy. Periventricular white matter low attenuation likely secondary to microangiopathy. Vascular: Cerebrovascular atherosclerotic calcifications are  noted. Skull: Negative for fracture or focal lesion. Sinuses/Orbits: Visualized portions of the orbits are unremarkable. Visualized portions of the paranasal sinuses and mastoid air cells are unremarkable. Other: Large right supraorbital soft tissue hematoma. CT MAXILLOFACIAL FINDINGS Osseous: No fracture or mandibular dislocation. No destructive process. Orbits: Negative. No traumatic or inflammatory finding. Sinuses: Clear. Soft tissues: Large right supraorbital soft tissue hematoma. CT CERVICAL SPINE FINDINGS Alignment: 3 mm anterolisthesis of C3 on C4. Skull base and vertebrae: No acute fracture. No primary bone lesion or focal pathologic process. Soft tissues and spinal canal: No prevertebral fluid or swelling. No visible canal hematoma. Disc levels: Degenerative disc disease with disc height loss at C4-5 and C5-6. Severe left facet arthropathy at C2-3. Moderate bilateral facet arthropathy at C3-4. Upper chest: Lung apices are clear. Other: No fluid collection or hematoma. Bilateral carotid artery atherosclerosis. IMPRESSION: 1. No acute intracranial pathology. 2. No acute osseous injury of the maxillofacial bones. Large right supraorbital soft tissue hematoma. 3.  No acute osseous injury of the cervical spine. Electronically Signed   By: Elige KoHetal  Patel   On: 01/26/2017 12:03   Ct Cervical Spine Wo Contrast  Result Date: 01/26/2017 CLINICAL DATA:  Poly trauma, found patient in bed with a hematoma on right eye and an abrasion to left bottom of head-unsure if patient fell-patient has a history of dementia and can not recall incident. EXAM: CT HEAD WITHOUT CONTRAST CT MAXILLOFACIAL WITHOUT CONTRAST CT CERVICAL SPINE WITHOUT CONTRAST TECHNIQUE: Multidetector CT imaging of the head, cervical spine, and maxillofacial structures were performed using the standard protocol without intravenous contrast. Multiplanar CT image reconstructions of the cervical spine and maxillofacial structures were also generated.  COMPARISON:  08/04/2016 FINDINGS: CT HEAD FINDINGS Brain: No evidence of acute infarction, hemorrhage, extra-axial collection, ventriculomegaly, or mass effect. Generalized cerebral atrophy. Periventricular white matter low attenuation likely secondary to microangiopathy. Vascular: Cerebrovascular atherosclerotic calcifications are noted. Skull: Negative for fracture or focal lesion. Sinuses/Orbits: Visualized portions of the orbits are unremarkable. Visualized portions of the paranasal sinuses and mastoid air cells are unremarkable. Other: Large right supraorbital soft tissue hematoma. CT MAXILLOFACIAL FINDINGS Osseous: No fracture or mandibular dislocation. No destructive process. Orbits: Negative. No traumatic or inflammatory finding. Sinuses: Clear. Soft tissues: Large right supraorbital soft tissue hematoma. CT CERVICAL SPINE FINDINGS Alignment: 3 mm anterolisthesis of C3 on C4. Skull base and vertebrae: No acute fracture. No primary bone lesion or focal pathologic process. Soft tissues and spinal canal: No prevertebral fluid or swelling. No visible canal hematoma. Disc levels: Degenerative disc disease with disc height loss at C4-5 and C5-6. Severe left facet arthropathy at C2-3. Moderate bilateral facet arthropathy at C3-4. Upper chest: Lung apices are clear. Other: No fluid collection or hematoma. Bilateral carotid  artery atherosclerosis. IMPRESSION: 1. No acute intracranial pathology. 2. No acute osseous injury of the maxillofacial bones. Large right supraorbital soft tissue hematoma. 3.  No acute osseous injury of the cervical spine. Electronically Signed   By: Elige Ko   On: 01/26/2017 12:03   Ct Maxillofacial Wo Cm  Result Date: 01/26/2017 CLINICAL DATA:  Poly trauma, found patient in bed with a hematoma on right eye and an abrasion to left bottom of head-unsure if patient fell-patient has a history of dementia and can not recall incident. EXAM: CT HEAD WITHOUT CONTRAST CT MAXILLOFACIAL WITHOUT  CONTRAST CT CERVICAL SPINE WITHOUT CONTRAST TECHNIQUE: Multidetector CT imaging of the head, cervical spine, and maxillofacial structures were performed using the standard protocol without intravenous contrast. Multiplanar CT image reconstructions of the cervical spine and maxillofacial structures were also generated. COMPARISON:  08/04/2016 FINDINGS: CT HEAD FINDINGS Brain: No evidence of acute infarction, hemorrhage, extra-axial collection, ventriculomegaly, or mass effect. Generalized cerebral atrophy. Periventricular white matter low attenuation likely secondary to microangiopathy. Vascular: Cerebrovascular atherosclerotic calcifications are noted. Skull: Negative for fracture or focal lesion. Sinuses/Orbits: Visualized portions of the orbits are unremarkable. Visualized portions of the paranasal sinuses and mastoid air cells are unremarkable. Other: Large right supraorbital soft tissue hematoma. CT MAXILLOFACIAL FINDINGS Osseous: No fracture or mandibular dislocation. No destructive process. Orbits: Negative. No traumatic or inflammatory finding. Sinuses: Clear. Soft tissues: Large right supraorbital soft tissue hematoma. CT CERVICAL SPINE FINDINGS Alignment: 3 mm anterolisthesis of C3 on C4. Skull base and vertebrae: No acute fracture. No primary bone lesion or focal pathologic process. Soft tissues and spinal canal: No prevertebral fluid or swelling. No visible canal hematoma. Disc levels: Degenerative disc disease with disc height loss at C4-5 and C5-6. Severe left facet arthropathy at C2-3. Moderate bilateral facet arthropathy at C3-4. Upper chest: Lung apices are clear. Other: No fluid collection or hematoma. Bilateral carotid artery atherosclerosis. IMPRESSION: 1. No acute intracranial pathology. 2. No acute osseous injury of the maxillofacial bones. Large right supraorbital soft tissue hematoma. 3.  No acute osseous injury of the cervical spine. Electronically Signed   By: Elige Ko   On: 01/26/2017  12:03    Procedures Procedures (including critical care time)  Medications Ordered in ED Medications - No data to display   Initial Impression / Assessment and Plan / ED Course  I have reviewed the triage vital signs and the nursing notes.  Pertinent labs & imaging results that were available during my care of the patient were reviewed by me and considered in my medical decision making (see chart for details).     12:08 PM On repeat exam the patient is in no distress.  This elderly female presents from nursing facility after unwitnessed fall. On exam she has obvious facial trauma, although she is moving all extremities spontaneously, her dementia precludes full investigation. Evaluation here, however, generally reassuring, no CT evidence for fracture, intracranial hemorrhage. Given these reassuring findings, and reported consistent interactivity, according to nursing home staff, patient is appropriate for discharge with close outpatient follow-up.  Final Clinical Impressions(s) / ED Diagnoses   Final diagnoses:  Fall, initial encounter  Contusion of face, initial encounter      Gerhard Munch, MD 01/26/17 1209

## 2017-01-26 NOTE — ED Triage Notes (Signed)
Per EMS-nursing facility fund patient in bed with a hematoma on right eye and an abrasion to left bottom of head-unsure if patient fell-patient has a history of dementia and can not recall incident-patient is not complaining of any pain-not on blood thinners-at baseline mentally per nursing facility staff

## 2017-01-26 NOTE — ED Notes (Signed)
[facility] Expected date:  Expected time:  Means of arrival:  Comments: EMS-fall/dementia

## 2017-01-26 NOTE — Discharge Instructions (Signed)
As discussed, your evaluation today has been largely reassuring.  But, it is important that you monitor your condition carefully, and do not hesitate to return to the ED if you develop new, or concerning changes in your condition. ? ?Otherwise, please follow-up with your physician for appropriate ongoing care. ? ?

## 2017-01-26 NOTE — ED Notes (Signed)
ptar called 

## 2017-06-06 ENCOUNTER — Emergency Department (HOSPITAL_COMMUNITY): Payer: Medicare Other

## 2017-06-06 ENCOUNTER — Other Ambulatory Visit: Payer: Self-pay

## 2017-06-06 ENCOUNTER — Encounter (HOSPITAL_COMMUNITY): Payer: Self-pay

## 2017-06-06 ENCOUNTER — Emergency Department (HOSPITAL_COMMUNITY)
Admission: EM | Admit: 2017-06-06 | Discharge: 2017-06-06 | Disposition: A | Discharge status: Home / Self Care | Payer: Medicare Other | Attending: Emergency Medicine | Admitting: Emergency Medicine

## 2017-06-06 DIAGNOSIS — W010XXA Fall on same level from slipping, tripping and stumbling without subsequent striking against object, initial encounter: Secondary | ICD-10-CM | POA: Diagnosis not present

## 2017-06-06 DIAGNOSIS — Z79899 Other long term (current) drug therapy: Secondary | ICD-10-CM | POA: Diagnosis not present

## 2017-06-06 DIAGNOSIS — I2510 Atherosclerotic heart disease of native coronary artery without angina pectoris: Secondary | ICD-10-CM | POA: Diagnosis not present

## 2017-06-06 DIAGNOSIS — S63501A Unspecified sprain of right wrist, initial encounter: Secondary | ICD-10-CM | POA: Insufficient documentation

## 2017-06-06 DIAGNOSIS — Y999 Unspecified external cause status: Secondary | ICD-10-CM | POA: Diagnosis not present

## 2017-06-06 DIAGNOSIS — J45909 Unspecified asthma, uncomplicated: Secondary | ICD-10-CM | POA: Insufficient documentation

## 2017-06-06 DIAGNOSIS — S0990XA Unspecified injury of head, initial encounter: Secondary | ICD-10-CM | POA: Diagnosis present

## 2017-06-06 DIAGNOSIS — Y9301 Activity, walking, marching and hiking: Secondary | ICD-10-CM | POA: Insufficient documentation

## 2017-06-06 DIAGNOSIS — Y92129 Unspecified place in nursing home as the place of occurrence of the external cause: Secondary | ICD-10-CM | POA: Diagnosis not present

## 2017-06-06 DIAGNOSIS — W19XXXA Unspecified fall, initial encounter: Secondary | ICD-10-CM

## 2017-06-06 DIAGNOSIS — I10 Essential (primary) hypertension: Secondary | ICD-10-CM | POA: Diagnosis not present

## 2017-06-06 DIAGNOSIS — F0390 Unspecified dementia without behavioral disturbance: Secondary | ICD-10-CM | POA: Diagnosis not present

## 2017-06-06 DIAGNOSIS — S161XXA Strain of muscle, fascia and tendon at neck level, initial encounter: Secondary | ICD-10-CM | POA: Diagnosis not present

## 2017-06-06 DIAGNOSIS — S0083XA Contusion of other part of head, initial encounter: Secondary | ICD-10-CM | POA: Insufficient documentation

## 2017-06-06 NOTE — ED Triage Notes (Signed)
[facility] when pt. Was observed to fall. She "forgot" to ambulate with her walker, which is sometimes her wont. She has mild, superficial abrasions at right upper face and forehead area. She also c/o some right wrist pain. She is awake, alert and confused at her baseline.

## 2017-06-06 NOTE — ED Provider Notes (Signed)
[facility]-EMERGENCY DEPT Provider Note   CSN: [id number] Arrival date & time: 06/06/17  4098     History   Chief Complaint Chief Complaint  Patient presents with  . Fall    HPI [name] is a 84 y.o. female.  Pt presents to the ED today with fall.  Pt has a hx of dementia and became agitated and walked out of her room without her walker.  She had a witnessed fall.  She hit her head and c/o right wrist pain.     Past Medical History:  Diagnosis Date  . ALLERGIC RHINITIS 11/09/2006  . Asthma   . ASTHMATIC BRONCHITIS, ACUTE 01/14/2007  . BACK PAIN 03/01/2009  . BLURRED VISION 06/29/2008  . CEREBROVASCULAR ACCIDENT, HX OF 07/24/2006  . CHEST PAIN 09/12/2007  . Chronic cough   . CONSTIPATION 03/01/2009  . CORONARY ARTERY DISEASE 07/24/2006  . Coronary atherosclerosis of native coronary artery   . Cough 02/01/2010  . Degenerative disc disease   . FATIGUE 01/21/2008  . GERD 06/04/2006  . Goiter, unspecified 02/01/2010  . HYPERLIPIDEMIA 12/20/2006  . HYPERTENSION 11/09/2006  . INSOMNIA-SLEEP DISORDER-UNSPEC 11/09/2006  . OSTEOPOROSIS 10/13/2009  . Paroxysmal atrial fibrillation (HCC)   . Recurrent major depression (HCC)   . Senile dementia, uncomplicated 01/06/2009  . Stroke [facility])     Patient Active Problem List   Diagnosis Date Noted  . Hypokalemia 02/26/2015  . Anemia of chronic disease 02/26/2015  . Fall 02/25/2015  . Forehead laceration 02/25/2015  . UTI (lower urinary tract infection) 02/25/2015  . Senile dementia, uncomplicated 01/06/2009    Past Surgical History:  Procedure Laterality Date  . DILATION AND CURETTAGE OF UTERUS       OB History   None      Home Medications    Prior to Admission medications   Medication Sig Start Date End Date Taking? Authorizing Provider  Calcium Carbonate-Vitamin D (CALCIUM 600/VITAMIN D) 600-400 MG-UNIT per tablet Take 1 tablet by mouth 2 (two) times daily.   Yes [provider]  cetirizine  (ZYRTEC) 10 MG tablet Take 10 mg by mouth daily.   Yes [provider]  donepezil (ARICEPT) 10 MG tablet Take 10 mg by mouth at bedtime.   Yes Corwin Levins, MD  Ergocalciferol 2000 units TABS Take 1 tablet by mouth daily.   Yes [provider]  escitalopram (LEXAPRO) 20 MG tablet Take 20 mg by mouth daily.    Yes [provider]  Fluticasone Furoate-Vilanterol (BREO ELLIPTA) 100-25 MCG/INH AEPB Inhale 1 puff into the lungs daily.   Yes [provider]  montelukast (SINGULAIR) 10 MG tablet Take 10 mg by mouth daily.    Yes [provider]  Multiple Vitamin (TAB-A-VITE PO) Take 1 tablet by mouth daily.   Yes [provider]  omeprazole (PRILOSEC) 40 MG capsule Take 40 mg by mouth daily.   Yes [provider]  ondansetron (ZOFRAN) 4 MG tablet Take 4 mg by mouth every 4 (four) hours as needed for nausea or vomiting.   Yes [provider]  pravastatin (PRAVACHOL) 40 MG tablet Take 40 mg by mouth at bedtime.    Yes [provider]  QUEtiapine (SEROQUEL) 25 MG tablet Take 12.5-25 mg by mouth 2 (two) times daily. 1 tablet every 12 hours as needed for behaviors. Give 1/2 tablet twice daily as needed.   Yes [provider]  acetaminophen (TYLENOL) 325 MG tablet Take 650 mg by mouth every 6 (  six) hours as needed for mild pain or moderate pain.     [provider]  albuterol (PROVENTIL HFA;VENTOLIN HFA) 108 (90 Base) MCG/ACT inhaler Inhale 2 puffs into the lungs every 4 (four) hours as needed for wheezing or shortness of breath.    [provider]  docusate sodium (COLACE) 100 MG capsule Take 1 capsule (100 mg total) by mouth every 12 (twelve) hours. Take while on lortab to avoid constipation. Patient not taking: Reported on 06/06/2017 06/07/16   Jacalyn Lefevre, MD  HYDROcodone-acetaminophen (NORCO/VICODIN) 5-325 MG tablet Take 1 tablet by mouth every 4 (four) hours as needed for moderate pain.    [provider]  ketoconazole (NIZORAL) 2 % cream Apply 1 application topically every 12 (twelve) hours as needed for irritation (and/or redness). buttocks     [provider]  loperamide (IMODIUM) 2 MG capsule Take 2 mg by mouth every 6 (six) hours as needed for diarrhea or loose stools.    [provider]  Skin Protectants, Misc. (EUCERIN) cream Apply 1 application topically every evening.    [provider]  triamcinolone cream (KENALOG) 0.1 % Apply 1 application topically 2 (two) times daily as needed (for itching on arms and glutes).    [provider]  Zinc Oxide (DESITIN) 13 % CREA Apply 1 application topically every 8 (eight) hours as needed (for redness/chaffing on buttocks).    [provider]    Family History Family History  Problem Relation Age of Onset  . Coronary artery disease Other     Social History Social History   Tobacco Use  . Smoking status: Never Smoker  . Smokeless tobacco: Never Used  Substance Use Topics  . Alcohol use: No  . Drug use: No     Allergies   Patient has no known allergies.   Review of Systems Review of Systems  Musculoskeletal:       Right wrist  Neurological: Positive for headaches.  All other systems reviewed and are negative.    Physical Exam Updated Vital Signs BP (!) 154/75   Pulse 88   Temp 98.4 F (36.9 C) (Oral)   Resp 15   SpO2 98%   Physical Exam  Constitutional: She appears well-developed and well-nourished.  HENT:  Head: Normocephalic.    Right Ear: External ear normal.  Left Ear: External ear normal.  Nose: Nose normal.  Mouth/Throat: Oropharynx is clear and moist.  Eyes: Pupils are equal, round, and reactive to light. Conjunctivae and EOM are normal.  Neck: Normal range of motion. Neck supple.  Cardiovascular: Normal rate, regular rhythm, normal heart sounds and intact distal pulses.  Pulmonary/Chest: Effort normal and breath sounds normal.  Abdominal: Soft.  Bowel sounds are normal.  Musculoskeletal:       Right wrist: She exhibits decreased range of motion and tenderness.  Neurological: She is alert.  Skin: Skin is warm. Capillary refill takes less than 2 seconds.  Psychiatric: She has a normal mood and affect. Her behavior is normal. Judgment and thought content normal.  Nursing note and vitals reviewed.    ED Treatments / Results  Labs (all labs ordered are listed, but only abnormal results are displayed) Labs Reviewed - No data to display  EKG None  Radiology Dg Chest 2 View  Result Date: 06/06/2017 CLINICAL DATA:  Recent fall with wrist pain, initial encounter EXAM: CHEST - 2 VIEW COMPARISON:  02/25/2015 FINDINGS: Cardiac shadow remains mildly enlarged but stable. Postsurgical changes are again seen. Aortic  calcifications are noted. No focal infiltrate or sizable effusion is seen. No acute bony abnormality is noted. IMPRESSION: No active cardiopulmonary disease. Electronically Signed   By: Alcide Clever M.D.   On: 06/06/2017 20:04   Dg Wrist Complete Right  Result Date: 06/06/2017 CLINICAL DATA:  Recent fall with wrist pain, initial encounter EXAM: RIGHT WRIST - COMPLETE 3+ VIEW COMPARISON:  06/07/2016 FINDINGS: Postsurgical changes are again noted in the distal radius. Changes of prior healed fracture at the base of the first, second and third metacarpals are noted. Degenerative changes in the carpal bones are seen. IMPRESSION: No definitive acute fracture is noted. Old healed fractures and postsurgical changes are seen. Electronically Signed   By: Alcide Clever M.D.   On: 06/06/2017 20:11   Ct Head Wo Contrast  Result Date: 06/06/2017 CLINICAL DATA:  Fall EXAM: CT HEAD WITHOUT CONTRAST CT CERVICAL SPINE WITHOUT CONTRAST TECHNIQUE: Multidetector CT imaging of the head and cervical spine was performed following the standard protocol without intravenous contrast. Multiplanar CT image reconstructions of the cervical spine were also generated.  COMPARISON:  01/26/2017 FINDINGS: CT HEAD FINDINGS Brain: There is atrophy and chronic small vessel disease changes. No acute intracranial abnormality. Specifically, no hemorrhage, hydrocephalus, mass lesion, acute infarction, or significant intracranial injury. Vascular: No hyperdense vessel or unexpected calcification. Skull: No acute calvarial abnormality. Sinuses/Orbits: Visualized paranasal sinuses and mastoids clear. Orbital soft tissues unremarkable. Other: None CT CERVICAL SPINE FINDINGS Alignment: Slight anterolisthesis of C3 on C4 related to facet disease. Skull base and vertebrae: No acute fracture. No primary bone lesion or focal pathologic process. Soft tissues and spinal canal: No prevertebral fluid or swelling. No visible canal hematoma. Disc levels: Degenerative disc disease and facet disease bilaterally. Upper chest: No acute findings. Other: Dense carotid artery calcifications.  No acute findings. IMPRESSION: No acute intracranial abnormality. Atrophy, chronic microvascular disease. Degenerative changes in the cervical spine. No acute bony abnormality. Electronically Signed   By: Charlett Nose M.D.   On: 06/06/2017 20:17   Ct Cervical Spine Wo Contrast  Result Date: 06/06/2017 CLINICAL DATA:  Fall EXAM: CT HEAD WITHOUT CONTRAST CT CERVICAL SPINE WITHOUT CONTRAST TECHNIQUE: Multidetector CT imaging of the head and cervical spine was performed following the standard protocol without intravenous contrast. Multiplanar CT image reconstructions of the cervical spine were also generated. COMPARISON:  01/26/2017 FINDINGS: CT HEAD FINDINGS Brain: There is atrophy and chronic small vessel disease changes. No acute intracranial abnormality. Specifically, no hemorrhage, hydrocephalus, mass lesion, acute infarction, or significant intracranial injury. Vascular: No hyperdense vessel or unexpected calcification. Skull: No acute calvarial abnormality. Sinuses/Orbits: Visualized paranasal sinuses and mastoids  clear. Orbital soft tissues unremarkable. Other: None CT CERVICAL SPINE FINDINGS Alignment: Slight anterolisthesis of C3 on C4 related to facet disease. Skull base and vertebrae: No acute fracture. No primary bone lesion or focal pathologic process. Soft tissues and spinal canal: No prevertebral fluid or swelling. No visible canal hematoma. Disc levels: Degenerative disc disease and facet disease bilaterally. Upper chest: No acute findings. Other: Dense carotid artery calcifications.  No acute findings. IMPRESSION: No acute intracranial abnormality. Atrophy, chronic microvascular disease. Degenerative changes in the cervical spine. No acute bony abnormality. Electronically Signed   By: Charlett Nose M.D.   On: 06/06/2017 20:17    Procedures Procedures (including critical care time)  Medications Ordered in ED Medications - No data to display   Initial Impression / Assessment and Plan / ED Course  I have reviewed the triage vital signs and the nursing notes.  Pertinent labs & imaging results that were available during my care of the patient were reviewed by me and considered in my medical decision making (see chart for details).    Pt d/w her son, Dr. Nedra HaiLee.    Pt's wrist still tender with no new fracture, so she is placed in a velcro splint.    Return if worse.  F/u with pcp.  Final Clinical Impressions(s) / ED Diagnoses   Final diagnoses:  Fall, initial encounter  Contusion of face, initial encounter  Strain of neck muscle, initial encounter  Wrist sprain, right, initial encounter    ED Discharge Orders    None       Jacalyn LefevreHaviland, Hasana Alcorta, MD 06/06/17 2038

## 2017-06-06 NOTE — ED Notes (Signed)
Pt able to ambulate with walker  

## 2017-06-06 NOTE — ED Notes (Signed)
[facility] contacted to transport pt back to [facility] on [address]

## 2017-06-06 NOTE — ED Triage Notes (Signed)
Her son phones to let us know he is available by phone [phone]  [phone]. His name is Dr. Ian MalkinAlfred Manke and he is pt's. P.O.A.

## 2017-07-29 IMAGING — CT CT CERVICAL SPINE W/O CM
3 of 6 series · 11 of 33 positions shown, 13 images · non-contrast
Comparison: CT of the head 10/13/2013

CLINICAL DATA: Status post unwitnessed fall.  History of dementia.

EXAM:
CT HEAD WITHOUT CONTRAST
CT CERVICAL SPINE WITHOUT CONTRAST
TECHNIQUE: Multidetector CT imaging of the head and cervical spine was
performed following the standard protocol without intravenous
contrast. Multiplanar CT image reconstructions of the cervical spine
were also generated.

[Series 5: c-spine st · axial · 0.25mm/px · z∈[-252,-166]mm · 3 of 87 slices shown, 4 images]
[im 22/87  soft-tissue]
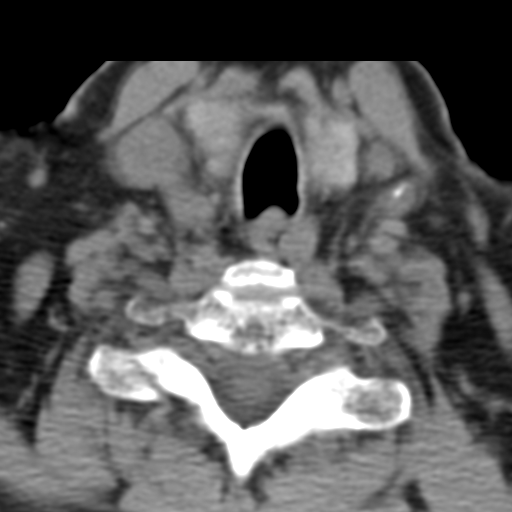
[im 22/87  bone]
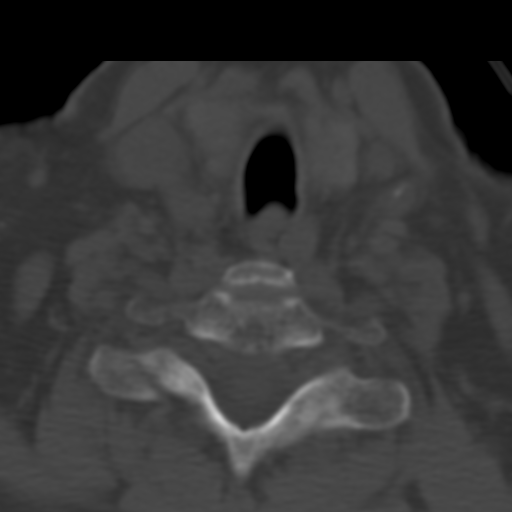
[im 44/87  bone]
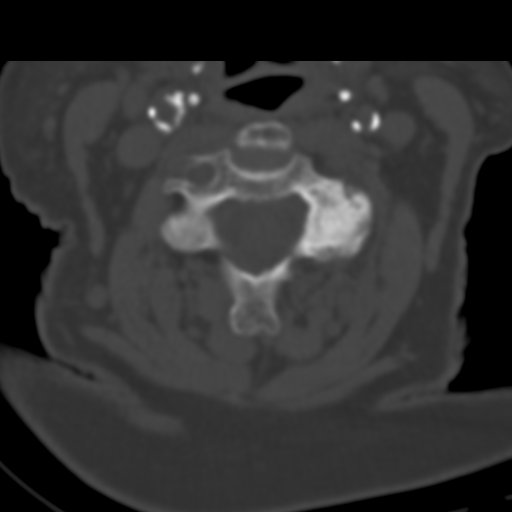
[im 65/87  bone]
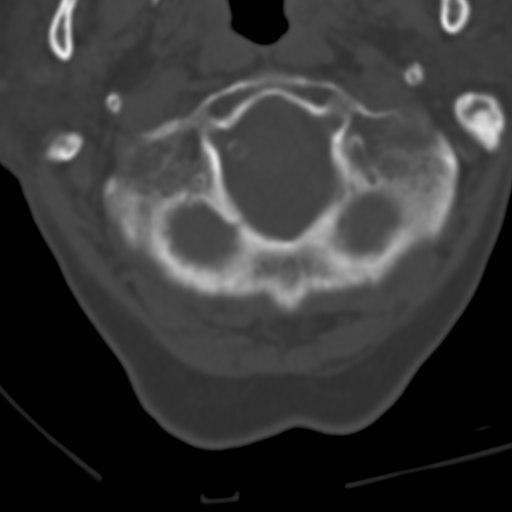

[Series 9: coronal recons · coronal · 0.25mm/px · 3 of 46 slices shown]
[im 10/46  bone]
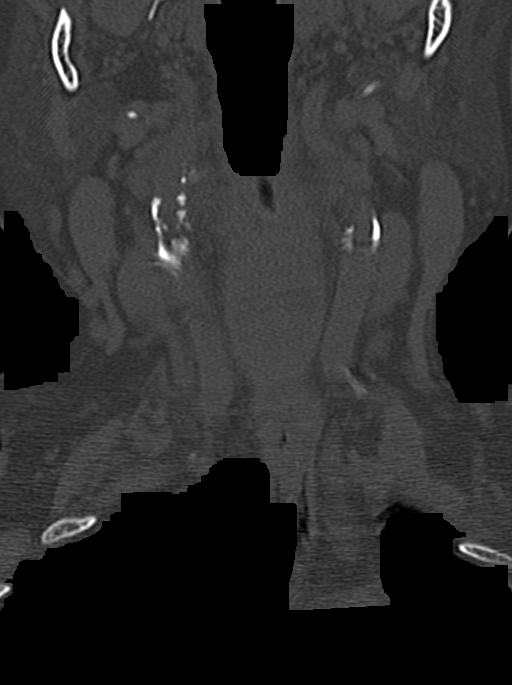
[im 19/46  bone]
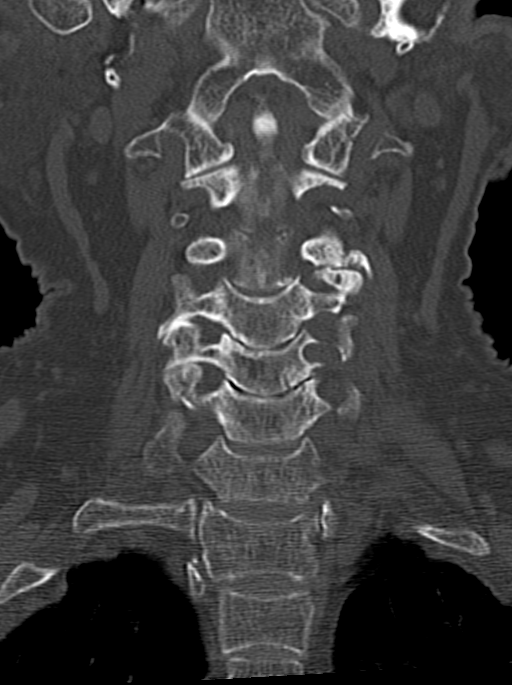
[im 28/46  bone]
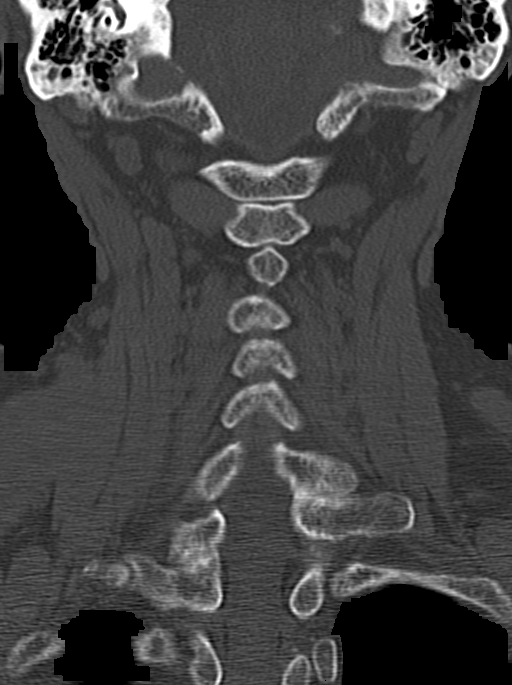

[Series 10: sagittal recons · sagittal · 0.19mm/px · 5 of 61 slices shown, 6 images]
[im 21/61  bone]
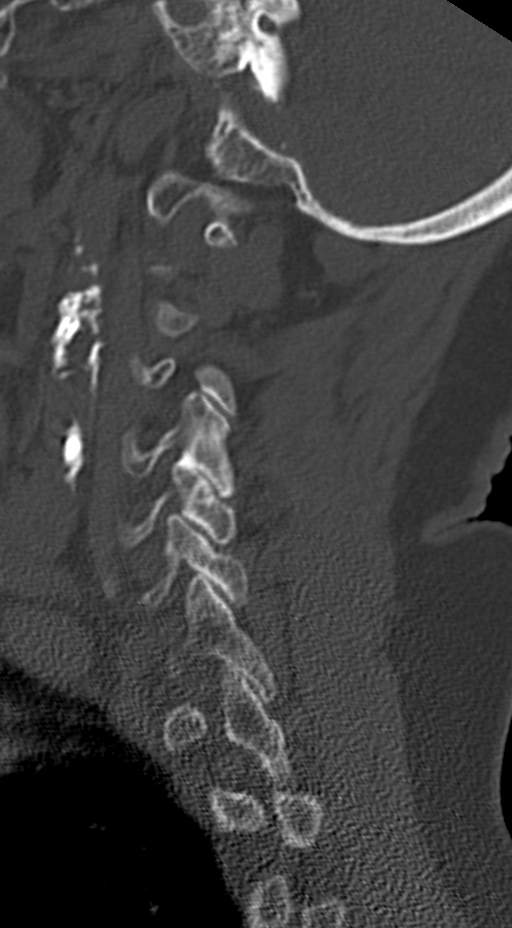
[im 26/61  bone]
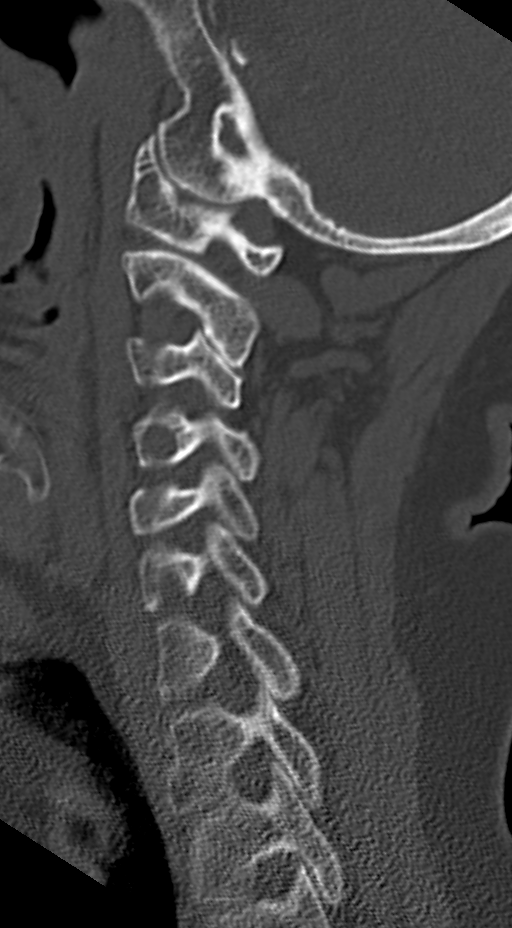
[im 31/61  soft-tissue]
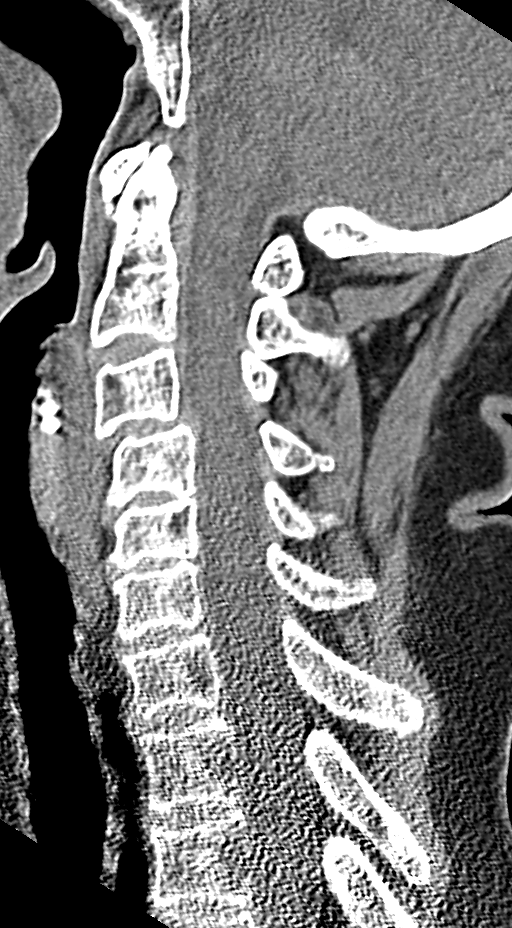
[im 31/61  bone]
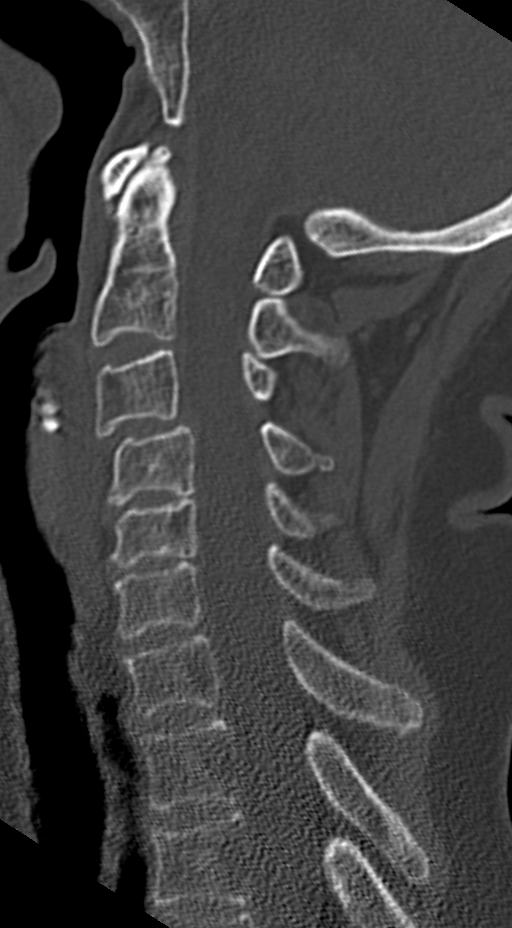
[im 36/61  bone]
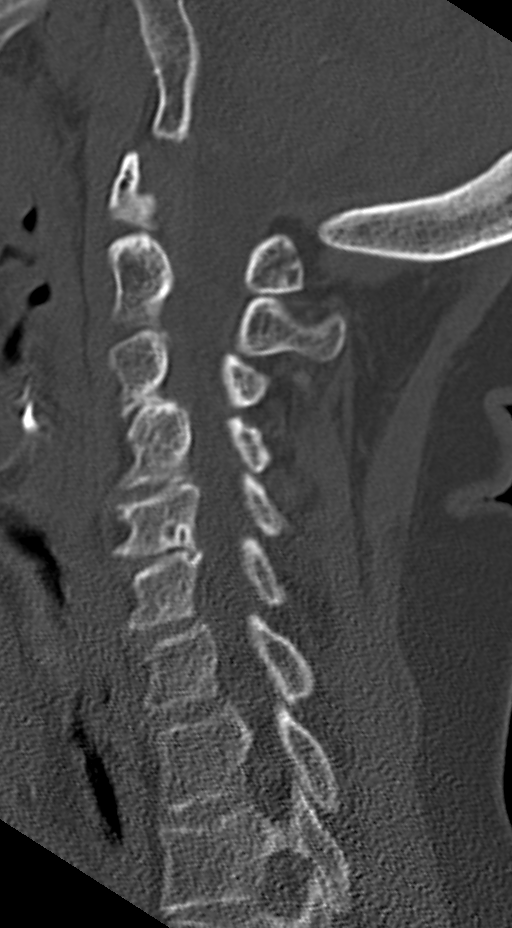
[im 41/61  bone]
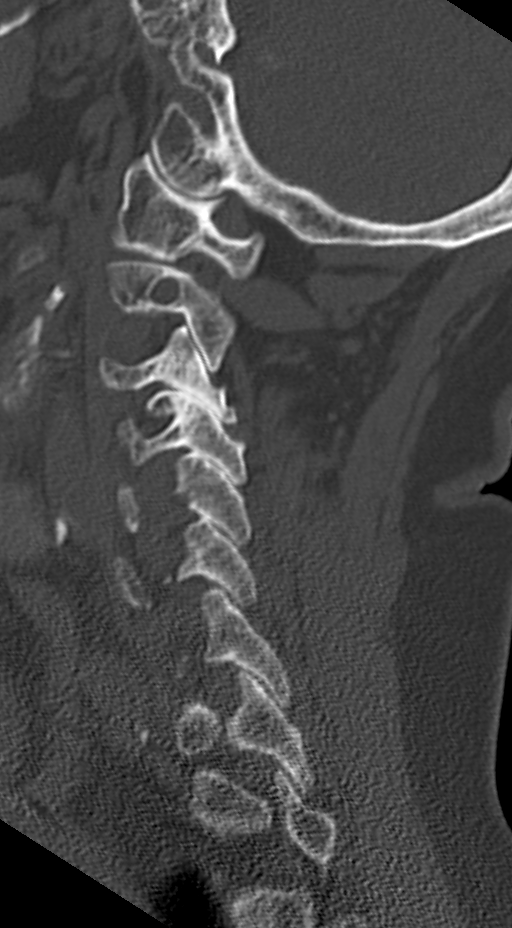

[11 of 33 positions shown; findings below may reference images not displayed]

FINDINGS: CT HEAD FINDINGS

No mass effect or midline shift. No evidence of acute intracranial
hemorrhage, or infarction. No abnormal extra-axial fluid
collections. There is moderate brain parenchymal atrophy and chronic
small vessel disease changes. Basal cisterns are preserved. Vascular
calcifications are noted at the skullbase.

No depressed skull fractures. Visualized paranasal sinuses and
mastoid air cells are not opacified.

CT CERVICAL SPINE FINDINGS

There is no evidence for acute fracture or dislocation. There are
multilevel osteoarthritic changes of the cervical spine. Minimal
anterolisthesis of C3 on C4 is seen, likely due to severe left sided
facet arthropathy. Prevertebral soft tissues have a normal
appearance.

Lung apices have a normal appearance.
IMPRESSION: No evidence of acute traumatic injury to the head or cervical spine.

Moderate brain parenchymal atrophy and microvascular disease.

Moderate multilevel osteoarthritic changes of the cervical spine,
with minimal anterolisthesis of C3 on C4.

## 2017-08-19 ENCOUNTER — Emergency Department (HOSPITAL_COMMUNITY)
Admission: EM | Admit: 2017-08-19 | Discharge: 2017-08-19 | Disposition: A | Discharge status: Home / Self Care | Payer: Medicare Other | Attending: Emergency Medicine | Admitting: Emergency Medicine

## 2017-08-19 ENCOUNTER — Emergency Department (HOSPITAL_COMMUNITY): Payer: Medicare Other

## 2017-08-19 DIAGNOSIS — F0390 Unspecified dementia without behavioral disturbance: Secondary | ICD-10-CM | POA: Insufficient documentation

## 2017-08-19 DIAGNOSIS — Y998 Other external cause status: Secondary | ICD-10-CM | POA: Insufficient documentation

## 2017-08-19 DIAGNOSIS — J45909 Unspecified asthma, uncomplicated: Secondary | ICD-10-CM | POA: Insufficient documentation

## 2017-08-19 DIAGNOSIS — Y9389 Activity, other specified: Secondary | ICD-10-CM | POA: Diagnosis not present

## 2017-08-19 DIAGNOSIS — S0083XA Contusion of other part of head, initial encounter: Secondary | ICD-10-CM

## 2017-08-19 DIAGNOSIS — Y92129 Unspecified place in nursing home as the place of occurrence of the external cause: Secondary | ICD-10-CM | POA: Insufficient documentation

## 2017-08-19 DIAGNOSIS — S0592XA Unspecified injury of left eye and orbit, initial encounter: Secondary | ICD-10-CM | POA: Diagnosis present

## 2017-08-19 DIAGNOSIS — W0110XA Fall on same level from slipping, tripping and stumbling with subsequent striking against unspecified object, initial encounter: Secondary | ICD-10-CM | POA: Insufficient documentation

## 2017-08-19 DIAGNOSIS — S01112A Laceration without foreign body of left eyelid and periocular area, initial encounter: Secondary | ICD-10-CM | POA: Insufficient documentation

## 2017-08-19 DIAGNOSIS — S022XXA Fracture of nasal bones, initial encounter for closed fracture: Secondary | ICD-10-CM | POA: Diagnosis not present

## 2017-08-19 DIAGNOSIS — I10 Essential (primary) hypertension: Secondary | ICD-10-CM | POA: Insufficient documentation

## 2017-08-19 DIAGNOSIS — Z79899 Other long term (current) drug therapy: Secondary | ICD-10-CM | POA: Insufficient documentation

## 2017-08-19 DIAGNOSIS — I2510 Atherosclerotic heart disease of native coronary artery without angina pectoris: Secondary | ICD-10-CM | POA: Insufficient documentation

## 2017-08-19 DIAGNOSIS — W19XXXA Unspecified fall, initial encounter: Secondary | ICD-10-CM

## 2017-08-19 DIAGNOSIS — Z8673 Personal history of transient ischemic attack (TIA), and cerebral infarction without residual deficits: Secondary | ICD-10-CM | POA: Insufficient documentation

## 2017-08-19 DIAGNOSIS — Z23 Encounter for immunization: Secondary | ICD-10-CM | POA: Diagnosis not present

## 2017-08-19 DIAGNOSIS — F329 Major depressive disorder, single episode, unspecified: Secondary | ICD-10-CM | POA: Diagnosis not present

## 2017-08-19 MED ORDER — FENTANYL CITRATE (PF) 100 MCG/2ML IJ SOLN
25.0000 ug | Freq: Once | INTRAMUSCULAR | Status: AC
  Administered 2017-08-19: 25 ug via INTRAVENOUS
  Filled 2017-08-19: qty 2

## 2017-08-19 MED ORDER — TETANUS-DIPHTH-ACELL PERTUSSIS 5-2.5-18.5 LF-MCG/0.5 IM SUSP
0.5000 mL | Freq: Once | INTRAMUSCULAR | Status: AC
Start: 2017-08-19 — End: 2017-08-19
  Administered 2017-08-19: 0.5 mL via INTRAMUSCULAR
  Filled 2017-08-19: qty 0.5

## 2017-08-19 MED ORDER — CEPHALEXIN 250 MG PO CAPS: 250.0000 mg | ORAL_CAPSULE | Freq: Four times a day (QID) | ORAL | 0 refills | Status: AC

## 2017-08-19 MED ORDER — FUROSEMIDE 20 MG PO TABS: 40.0000 mg | ORAL_TABLET | Freq: Once | ORAL | Status: DC

## 2017-08-19 MED ORDER — ERYTHROMYCIN 5 MG/GM OP OINT
1.0000 "application " | TOPICAL_OINTMENT | Freq: Once | OPHTHALMIC | Status: AC
  Administered 2017-08-19: 1 via OPHTHALMIC
  Filled 2017-08-19: qty 3.5

## 2017-08-19 MED ORDER — LIDOCAINE-EPINEPHRINE (PF) 2 %-1:200000 IJ SOLN
20.0000 mL | Freq: Once | INTRAMUSCULAR | Status: DC
  Filled 2017-08-19: qty 20

## 2017-08-19 MED ORDER — ERYTHROMYCIN 5 MG/GM OP OINT: TOPICAL_OINTMENT | OPHTHALMIC | 0 refills | Status: AC

## 2017-08-19 NOTE — ED Provider Notes (Signed)
[facility] EMERGENCY DEPARTMENT Provider Note   CSN: [id number] Arrival date & time: 08/19/17  1254     History   Chief Complaint Chief Complaint  Patient presents with  . Fall    HPI [name] is a 84 y.o. female.  Level 5 caveat secondary to dementia.  Patient is sent in from her facility after an unwitnessed fall.  She has dementia and does not recall the fall at all.  She does acknowledge that she has pain near her left eye but denies any other pain.  She has no headache no neck pain no chest pain no abdominal pain.  No numbness or weakness.  The history is provided by the patient and the EMS personnel. The history is limited by the condition of the patient.  Fall  This is a new problem. The current episode started less than 1 hour ago. The problem has not changed since onset.Pertinent negatives include no chest pain, no abdominal pain, no headaches and no shortness of breath. Nothing aggravates the symptoms. Nothing relieves the symptoms. She has tried nothing for the symptoms. The treatment provided no relief.    Past Medical History:  Diagnosis Date  . ALLERGIC RHINITIS 11/09/2006  . Asthma   . ASTHMATIC BRONCHITIS, ACUTE 01/14/2007  . BACK PAIN 03/01/2009  . BLURRED VISION 06/29/2008  . CEREBROVASCULAR ACCIDENT, HX OF 07/24/2006  . CHEST PAIN 09/12/2007  . Chronic cough   . CONSTIPATION 03/01/2009  . CORONARY ARTERY DISEASE 07/24/2006  . Coronary atherosclerosis of native coronary artery   . Cough 02/01/2010  . Degenerative disc disease   . FATIGUE 01/21/2008  . GERD 06/04/2006  . Goiter, unspecified 02/01/2010  . HYPERLIPIDEMIA 12/20/2006  . HYPERTENSION 11/09/2006  . INSOMNIA-SLEEP DISORDER-UNSPEC 11/09/2006  . OSTEOPOROSIS 10/13/2009  . Paroxysmal atrial fibrillation (HCC)   . Recurrent major depression (HCC)   . Senile dementia, uncomplicated 01/06/2009  . Stroke [facility])     Patient Active Problem List   Diagnosis Date Noted  . Hypokalemia  02/26/2015  . Anemia of chronic disease 02/26/2015  . Fall 02/25/2015  . Forehead laceration 02/25/2015  . UTI (lower urinary tract infection) 02/25/2015  . Senile dementia, uncomplicated 01/06/2009    Past Surgical History:  Procedure Laterality Date  . DILATION AND CURETTAGE OF UTERUS       OB History   None      Home Medications    Prior to Admission medications   Medication Sig Start Date End Date Taking? Authorizing Provider  acetaminophen (TYLENOL) 325 MG tablet Take 650 mg by mouth every 6 (six) hours as needed for mild pain or moderate pain.     [provider]  albuterol (PROVENTIL HFA;VENTOLIN HFA) 108 (90 Base) MCG/ACT inhaler Inhale 2 puffs into the lungs every 4 (four) hours as needed for wheezing or shortness of breath.    [provider]  Calcium Carbonate-Vitamin D (CALCIUM 600/VITAMIN D) 600-400 MG-UNIT per tablet Take 1 tablet by mouth 2 (two) times daily.    [provider]  cetirizine (ZYRTEC) 10 MG tablet Take 10 mg by mouth daily.    [provider]  docusate sodium (COLACE) 100 MG capsule Take 1 capsule (100 mg total) by mouth every 12 (twelve) hours. Take while on lortab to avoid constipation. Patient not taking: Reported on 06/06/2017 06/07/16   Jacalyn Lefevre, MD  donepezil (ARICEPT) 10 MG tablet Take 10 mg by mouth at bedtime.    Corwin Levins, MD  Ergocalciferol  2000 units TABS Take 1 tablet by mouth daily.    [provider]  escitalopram (LEXAPRO) 20 MG tablet Take 20 mg by mouth daily.     [provider]  Fluticasone Furoate-Vilanterol (BREO ELLIPTA) 100-25 MCG/INH AEPB Inhale 1 puff into the lungs daily.    [provider]  HYDROcodone-acetaminophen (NORCO/VICODIN) 5-325 MG tablet Take 1 tablet by mouth every 4 (four) hours as needed for moderate pain.    [provider]  ketoconazole (NIZORAL) 2 % cream Apply 1 application topically every 12 (twelve) hours as needed for irritation  (and/or redness). buttocks     [provider]  loperamide (IMODIUM) 2 MG capsule Take 2 mg by mouth every 6 (six) hours as needed for diarrhea or loose stools.    [provider]  montelukast (SINGULAIR) 10 MG tablet Take 10 mg by mouth daily.     [provider]  Multiple Vitamin (TAB-A-VITE PO) Take 1 tablet by mouth daily.    [provider]  omeprazole (PRILOSEC) 40 MG capsule Take 40 mg by mouth daily.    [provider]  ondansetron (ZOFRAN) 4 MG tablet Take 4 mg by mouth every 4 (four) hours as needed for nausea or vomiting.    [provider]  pravastatin (PRAVACHOL) 40 MG tablet Take 40 mg by mouth at bedtime.     [provider]  QUEtiapine (SEROQUEL) 25 MG tablet Take 12.5-25 mg by mouth 2 (two) times daily. 1 tablet every 12 hours as needed for behaviors. Give 1/2 tablet twice daily as needed.    [provider]  Skin Protectants, Misc. (EUCERIN) cream Apply 1 application topically every evening.    [provider]  triamcinolone cream (KENALOG) 0.1 % Apply 1 application topically 2 (two) times daily as needed (for itching on arms and glutes).    [provider]  Zinc Oxide (DESITIN) 13 % CREA Apply 1 application topically every 8 (eight) hours as needed (for redness/chaffing on buttocks).    [provider]    Family History Family History  Problem Relation Age of Onset  . Coronary artery disease Other     Social History Social History   Tobacco Use  . Smoking status: Never Smoker  . Smokeless tobacco: Never Used  Substance Use Topics  . Alcohol use: No  . Drug use: No     Allergies   Patient has no known allergies.   Review of Systems Review of Systems  Unable to perform ROS: Dementia  Constitutional: Negative for fever.  HENT: Negative for sore throat.   Eyes: Positive for pain (left).  Respiratory: Negative for shortness of breath.   Cardiovascular: Negative  for chest pain.  Gastrointestinal: Negative for abdominal pain.  Genitourinary: Negative for dysuria.  Musculoskeletal: Negative for back pain and neck pain.  Skin: Positive for wound.  Neurological: Negative for headaches.     Physical Exam Updated Vital Signs There were no vitals taken for this visit.  Physical Exam  Constitutional: She appears well-developed and well-nourished.  HENT:  Head: Normocephalic.  Right Ear: External ear normal.  Left Ear: External ear normal.  Mouth/Throat: Oropharynx is clear and moist.  She has some swelling over her nasal bridge.  She is got a laceration of the left lower lid and I do not visualize her lower eyelashes.  Eyes: Pupils are equal, round, and reactive to light. EOM are normal.  Her anterior chamber is clear bilaterally.  Extraocular movements appear intact to  the best of the patient's ability to follow commands.  She is able to count fingers at about 4 feet from the bed.  Neck: Neck supple.  Cardiovascular: Normal rate, regular rhythm, normal heart sounds and intact distal pulses.  Pulmonary/Chest: Effort normal. No stridor. She has no wheezes. She has no rales.  Abdominal: Soft. She exhibits no mass. There is no tenderness. There is no guarding.  Musculoskeletal: Normal range of motion. She exhibits no deformity.  Neurological: She is alert. She has normal strength. She is disoriented (to time and place). GCS eye subscore is 4. GCS verbal subscore is 5. GCS motor subscore is 6.  Moving all 4 extremities,   Skin: Skin is warm and dry.  Psychiatric: She has a normal mood and affect.  Nursing note and vitals reviewed.    ED Treatments / Results  Labs (all labs ordered are listed, but only abnormal results are displayed) Labs Reviewed - No data to display  EKG None  Radiology Ct Head Wo Contrast  Result Date: 08/19/2017 CLINICAL DATA:  84 year old female with head, face and neck injury following fall. LEFT facial bleeding.  EXAM: CT HEAD WITHOUT CONTRAST CT MAXILLOFACIAL WITHOUT CONTRAST CT CERVICAL SPINE WITHOUT CONTRAST TECHNIQUE: Multidetector CT imaging of the head, cervical spine, and maxillofacial structures were performed using the standard protocol without intravenous contrast. Multiplanar CT image reconstructions of the cervical spine and maxillofacial structures were also generated. COMPARISON:  06/06/2017 and prior CTs FINDINGS: CT HEAD FINDINGS Brain: No evidence of acute infarction, hemorrhage, hydrocephalus, extra-axial collection or mass lesion/mass effect. Atrophy and chronic small-vessel white matter ischemic changes noted. Vascular: Atherosclerotic calcifications again identified. Skull: Normal. Negative for fracture or focal lesion. Other: None. CT MAXILLOFACIAL FINDINGS Osseous: A nondisplaced fracture at the tip of the nasal bone noted. No other acute bony abnormalities are identified. Orbits: Negative. No traumatic or inflammatory finding. Sinuses: Clear. Soft tissues: LEFT facial soft tissue swelling noted. CT CERVICAL SPINE FINDINGS Alignment: 3 mm anterolisthesis of C3 on C4 is unchanged. No acute subluxation. Skull base and vertebrae: No acute fracture. No primary bone lesion or focal pathologic process. Soft tissues and spinal canal: No prevertebral fluid or swelling. No visible canal hematoma. Disc levels: Mild multilevel degenerative disc disease/spondylosis and LEFT facet arthropathy again noted. Upper chest: No acute abnormality Other: None IMPRESSION: 1. No evidence of acute intracranial abnormality. Atrophy and chronic small-vessel white matter ischemic changes. 2. Nondisplaced fracture at the nasal bone tip. LEFT facial soft tissue swelling. 3. No static evidence of acute injury to the cervical spine. Degenerative changes again noted. Electronically Signed   By: Harmon Pier M.D.   On: 08/19/2017 15:16   Ct Cervical Spine Wo Contrast  Result Date: 08/19/2017 CLINICAL DATA:  84 year old female with  head, face and neck injury following fall. LEFT facial bleeding. EXAM: CT HEAD WITHOUT CONTRAST CT MAXILLOFACIAL WITHOUT CONTRAST CT CERVICAL SPINE WITHOUT CONTRAST TECHNIQUE: Multidetector CT imaging of the head, cervical spine, and maxillofacial structures were performed using the standard protocol without intravenous contrast. Multiplanar CT image reconstructions of the cervical spine and maxillofacial structures were also generated. COMPARISON:  06/06/2017 and prior CTs FINDINGS: CT HEAD FINDINGS Brain: No evidence of acute infarction, hemorrhage, hydrocephalus, extra-axial collection or mass lesion/mass effect. Atrophy and chronic small-vessel white matter ischemic changes noted. Vascular: Atherosclerotic calcifications again identified. Skull: Normal. Negative for fracture or focal lesion. Other: None. CT MAXILLOFACIAL FINDINGS Osseous: A nondisplaced fracture at the tip of the nasal bone noted. No other acute bony abnormalities  are identified. Orbits: Negative. No traumatic or inflammatory finding. Sinuses: Clear. Soft tissues: LEFT facial soft tissue swelling noted. CT CERVICAL SPINE FINDINGS Alignment: 3 mm anterolisthesis of C3 on C4 is unchanged. No acute subluxation. Skull base and vertebrae: No acute fracture. No primary bone lesion or focal pathologic process. Soft tissues and spinal canal: No prevertebral fluid or swelling. No visible canal hematoma. Disc levels: Mild multilevel degenerative disc disease/spondylosis and LEFT facet arthropathy again noted. Upper chest: No acute abnormality Other: None IMPRESSION: 1. No evidence of acute intracranial abnormality. Atrophy and chronic small-vessel white matter ischemic changes. 2. Nondisplaced fracture at the nasal bone tip. LEFT facial soft tissue swelling. 3. No static evidence of acute injury to the cervical spine. Degenerative changes again noted. Electronically Signed   By: Harmon Pier M.D.   On: 08/19/2017 15:16   Ct Maxillofacial Wo Cm  Result  Date: 08/19/2017 CLINICAL DATA:  84 year old female with head, face and neck injury following fall. LEFT facial bleeding. EXAM: CT HEAD WITHOUT CONTRAST CT MAXILLOFACIAL WITHOUT CONTRAST CT CERVICAL SPINE WITHOUT CONTRAST TECHNIQUE: Multidetector CT imaging of the head, cervical spine, and maxillofacial structures were performed using the standard protocol without intravenous contrast. Multiplanar CT image reconstructions of the cervical spine and maxillofacial structures were also generated. COMPARISON:  06/06/2017 and prior CTs FINDINGS: CT HEAD FINDINGS Brain: No evidence of acute infarction, hemorrhage, hydrocephalus, extra-axial collection or mass lesion/mass effect. Atrophy and chronic small-vessel white matter ischemic changes noted. Vascular: Atherosclerotic calcifications again identified. Skull: Normal. Negative for fracture or focal lesion. Other: None. CT MAXILLOFACIAL FINDINGS Osseous: A nondisplaced fracture at the tip of the nasal bone noted. No other acute bony abnormalities are identified. Orbits: Negative. No traumatic or inflammatory finding. Sinuses: Clear. Soft tissues: LEFT facial soft tissue swelling noted. CT CERVICAL SPINE FINDINGS Alignment: 3 mm anterolisthesis of C3 on C4 is unchanged. No acute subluxation. Skull base and vertebrae: No acute fracture. No primary bone lesion or focal pathologic process. Soft tissues and spinal canal: No prevertebral fluid or swelling. No visible canal hematoma. Disc levels: Mild multilevel degenerative disc disease/spondylosis and LEFT facet arthropathy again noted. Upper chest: No acute abnormality Other: None IMPRESSION: 1. No evidence of acute intracranial abnormality. Atrophy and chronic small-vessel white matter ischemic changes. 2. Nondisplaced fracture at the nasal bone tip. LEFT facial soft tissue swelling. 3. No static evidence of acute injury to the cervical spine. Degenerative changes again noted. Electronically Signed   By: Harmon Pier M.D.    On: 08/19/2017 15:16    Procedures Procedures (including critical care time)  Medications Ordered in ED Medications - No data to display     Initial Impression / Assessment and Plan / ED Course  I have reviewed the triage vital signs and the nursing notes.  Pertinent labs & imaging results that were available during my care of the patient were reviewed by me and considered in my medical decision making (see chart for details).  Clinical Course as of Aug 20 1730  Wynelle Link Aug 19, 2017  1333 Discussed with Dr. Charlotte Sanes from ophthalmology.  She would like to come in and evaluate the patient for possible repair.  She understands that the patient can get some CT imaging prior and will call her when the patient's back from CT.  She asked that we put erythromycin ointment in the eye to keep it moisturized.   [MB]  1511 With Dr. Charlotte Sanes here in evaluating the patient for repair of her lid laceration.   [MB]  1527 Patient CTs are unremarkable other than a nondisplaced nasal fracture.  She likely will be discharged back to her facility once her wound is repaired.   [MB]    Clinical Course User Index [MB] Terrilee FilesButler, Michael C, MD     Final Clinical Impressions(s) / ED Diagnoses   Final diagnoses:  Fall, initial encounter  Left eyelid laceration, initial encounter  Contusion of face, initial encounter  Closed fracture of nasal bone, initial encounter    ED Discharge Orders         Ordered    erythromycin ophthalmic ointment     08/19/17 1600    cephALEXin (KEFLEX) 250 MG capsule  4 times daily     08/19/17 1600           Terrilee FilesButler, Michael C, MD 08/19/17 1733

## 2017-08-19 NOTE — Discharge Instructions (Addendum)
You were seen in the emergency department for a fall in which she had a nasal fracture and a complex laceration of your left lower eyelid.  Ophthalmology came in and repaired the laceration and wants to see you again in 1 week on Monday.  Erythromycin ointment to the left eyelid 4 times a day and Keflex oral antibiotic 4 times a day.  Follow-up sooner if any signs of infection.

## 2017-08-19 NOTE — ED Notes (Signed)
Opthamologist at bedside.   

## 2017-08-19 NOTE — Consult Note (Signed)
Reason for consult: eye and eyelid trauma  HPI: [name] is an 84 y.o. female who was found down in her living facility.  She has dementia and does not remember what happened.  She denies change in vision.  She denies diplopia.  Past Medical History:  Diagnosis Date  . ALLERGIC RHINITIS 11/09/2006  . Asthma   . ASTHMATIC BRONCHITIS, ACUTE 01/14/2007  . BACK PAIN 03/01/2009  . BLURRED VISION 06/29/2008  . CEREBROVASCULAR ACCIDENT, HX OF 07/24/2006  . CHEST PAIN 09/12/2007  . Chronic cough   . CONSTIPATION 03/01/2009  . CORONARY ARTERY DISEASE 07/24/2006  . Coronary atherosclerosis of native coronary artery   . Cough 02/01/2010  . Degenerative disc disease   . FATIGUE 01/21/2008  . GERD 06/04/2006  . Goiter, unspecified 02/01/2010  . HYPERLIPIDEMIA 12/20/2006  . HYPERTENSION 11/09/2006  . INSOMNIA-SLEEP DISORDER-UNSPEC 11/09/2006  . OSTEOPOROSIS 10/13/2009  . Paroxysmal atrial fibrillation (HCC)   . Recurrent major depression (HCC)   . Senile dementia, uncomplicated 01/06/2009  . Stroke [facility](HCC)    Past Surgical History:  Procedure Laterality Date  . DILATION AND CURETTAGE OF UTERUS     Family History  Problem Relation Age of Onset  . Coronary artery disease Other    Current Facility-Administered Medications  Medication Dose Route Frequency Provider Last Rate Last Dose  . lidocaine-EPINEPHrine (XYLOCAINE W/EPI) 2 %-1:200000 (PF) injection 20 mL  20 mL Infiltration Once Terrilee FilesButler, Michael C, MD       Current Outpatient Medications  Medication Sig Dispense Refill  . acetaminophen (TYLENOL) 325 MG tablet Take 650 mg by mouth every 6 (six) hours as needed for mild pain or moderate pain.     Marland Kitchen. albuterol (PROVENTIL HFA;VENTOLIN HFA) 108 (90 Base) MCG/ACT inhaler Inhale 2 puffs into the lungs every 4 (four) hours as needed for wheezing or shortness of breath.    . Calcium Carbonate-Vitamin D (CALCIUM 600/VITAMIN D) 600-400 MG-UNIT per tablet Take 1 tablet by mouth 2 (two) times daily.    .  cetirizine (ZYRTEC) 10 MG tablet Take 10 mg by mouth daily.    Marland Kitchen. docusate sodium (COLACE) 100 MG capsule Take 1 capsule (100 mg total) by mouth every 12 (twelve) hours. Take while on lortab to avoid constipation. 30 capsule 0  . donepezil (ARICEPT) 10 MG tablet Take 10 mg by mouth at bedtime.    . Ergocalciferol 2000 units TABS Take 2,000 Units by mouth daily.     Marland Kitchen. escitalopram (LEXAPRO) 10 MG tablet Take 10 mg by mouth daily.     . Fluticasone Furoate-Vilanterol (BREO ELLIPTA) 100-25 MCG/INH AEPB Inhale 1 puff into the lungs daily.    Marland Kitchen. HYDROcodone-acetaminophen (NORCO/VICODIN) 5-325 MG tablet Take 1 tablet by mouth every 4 (four) hours as needed for moderate pain.    Marland Kitchen. ketoconazole (NIZORAL) 2 % cream Apply 1 application topically every 12 (twelve) hours as needed for irritation (and/or redness). buttocks     . loperamide (IMODIUM) 2 MG capsule Take 2 mg by mouth every 6 (six) hours as needed for diarrhea or loose stools.    . montelukast (SINGULAIR) 10 MG tablet Take 10 mg by mouth daily.     . Multiple Vitamin (TAB-A-VITE PO) Take 1 tablet by mouth daily.    Marland Kitchen. omeprazole (PRILOSEC) 40 MG capsule Take 40 mg by mouth daily.    . ondansetron (ZOFRAN) 4 MG tablet Take 4 mg by mouth every 4 (four) hours as needed for nausea or vomiting.    . pravastatin (PRAVACHOL)  40 MG tablet Take 40 mg by mouth at bedtime.     . risperiDONE (RISPERDAL) 0.25 MG tablet Take 0.125 mg by mouth every 12 (twelve) hours as needed (anxiety or aggression).    . Skin Protectants, Misc. (EUCERIN) cream Apply 1 application topically every evening.    . triamcinolone cream (KENALOG) 0.1 % Apply 1 application topically 2 (two) times daily as needed (for itching on arms and glutes).    . Zinc Oxide (DESITIN) 13 % CREA Apply 1 application topically every 8 (eight) hours as needed (for redness/chaffing on buttocks).    . cephALEXin (KEFLEX) 250 MG capsule Take 1 capsule (250 mg total) by mouth 4 (four) times daily. 28 capsule 0   . erythromycin ophthalmic ointment Place a 1/2 inch ribbon of ointment into the left lower eyelid QID 1 g 0   No Known Allergies Social History   Socioeconomic History  . Marital status: Married    Spouse name: Not on file  . Number of children: Not on file  . Years of education: Not on file  . Highest education level: Not on file  Occupational History  . Not on file  Social Needs  . Financial resource strain: Not on file  . Food insecurity:    Worry: Not on file    Inability: Not on file  . Transportation needs:    Medical: Not on file    Non-medical: Not on file  Tobacco Use  . Smoking status: Never Smoker  . Smokeless tobacco: Never Used  Substance and Sexual Activity  . Alcohol use: No  . Drug use: No  . Sexual activity: Never  Lifestyle  . Physical activity:    Days per week: Not on file    Minutes per session: Not on file  . Stress: Not on file  Relationships  . Social connections:    Talks on phone: Not on file    Gets together: Not on file    Attends religious service: Not on file    Active member of club or organization: Not on file    Attends meetings of clubs or organizations: Not on file    Relationship status: Not on file  . Intimate partner violence:    Fear of current or ex partner: Not on file    Emotionally abused: Not on file    Physically abused: Not on file    Forced sexual activity: Not on file  Other Topics Concern  . Not on file  Social History Narrative  . Not on file    Review of systems: ROS:  Patient with dementia.  Denies any problems  Physical Exam:  General:  Awake and pleasant.  Confused.  NAD Blood pressure 140/62, pulse 69, resp. rate 18, SpO2 99 %.   VA [place]:  At leas count fingers with each eye.  (Dementia)    Pupils:   OD round, reactive to light, no APD            OS round, reactive to light, no APD  IOP (T pen)  OD 15  OS 14   CVF: unable  Motility:  OD full ductions  OS full ductions (Poor  cooperation)  Balance/alignment:  Ortho by Phoebe Perch   Bedside examination:                                 OD  External/adnexa: Normal                                      Lids/lashes:        Normal                                      Conjunctiva        White, quiet        Cornea:              Clear                  AC:                     Deep, quiet                                Iris:                     Normal        Lens:                  NS                                       OS                                       External/adnexa: Blood on face.  Abrasions on left face.                                      Lids/lashes:        Lower lid with medial canthal avulsion.  2 inch full thickness lid laceration of medial lower lid                                    Conjunctiva        White, quiet        Cornea:              Clear                  AC:                     Deep, quiet                                Iris:                     Normal        Lens:                  N       Labs/studies: No results found for this or any previous visit (from the past 48 hour(s)). Ct Head Wo Contrast  Result Date: 08/19/2017 CLINICAL  DATA:  84 year old female with head, face and neck injury following fall. LEFT facial bleeding. EXAM: CT HEAD WITHOUT CONTRAST CT MAXILLOFACIAL WITHOUT CONTRAST CT CERVICAL SPINE WITHOUT CONTRAST TECHNIQUE: Multidetector CT imaging of the head, cervical spine, and maxillofacial structures were performed using the standard protocol without intravenous contrast. Multiplanar CT image reconstructions of the cervical spine and maxillofacial structures were also generated. COMPARISON:  06/06/2017 and prior CTs FINDINGS: CT HEAD FINDINGS Brain: No evidence of acute infarction, hemorrhage, hydrocephalus, extra-axial collection or mass lesion/mass effect. Atrophy and chronic small-vessel white matter ischemic changes noted. Vascular:  Atherosclerotic calcifications again identified. Skull: Normal. Negative for fracture or focal lesion. Other: None. CT MAXILLOFACIAL FINDINGS Osseous: A nondisplaced fracture at the tip of the nasal bone noted. No other acute bony abnormalities are identified. Orbits: Negative. No traumatic or inflammatory finding. Sinuses: Clear. Soft tissues: LEFT facial soft tissue swelling noted. CT CERVICAL SPINE FINDINGS Alignment: 3 mm anterolisthesis of C3 on C4 is unchanged. No acute subluxation. Skull base and vertebrae: No acute fracture. No primary bone lesion or focal pathologic process. Soft tissues and spinal canal: No prevertebral fluid or swelling. No visible canal hematoma. Disc levels: Mild multilevel degenerative disc disease/spondylosis and LEFT facet arthropathy again noted. Upper chest: No acute abnormality Other: None IMPRESSION: 1. No evidence of acute intracranial abnormality. Atrophy and chronic small-vessel white matter ischemic changes. 2. Nondisplaced fracture at the nasal bone tip. LEFT facial soft tissue swelling. 3. No static evidence of acute injury to the cervical spine. Degenerative changes again noted. Electronically Signed   By: Harmon Pier M.D.   On: 08/19/2017 15:16   Ct Cervical Spine Wo Contrast  Result Date: 08/19/2017 CLINICAL DATA:  84 year old female with head, face and neck injury following fall. LEFT facial bleeding. EXAM: CT HEAD WITHOUT CONTRAST CT MAXILLOFACIAL WITHOUT CONTRAST CT CERVICAL SPINE WITHOUT CONTRAST TECHNIQUE: Multidetector CT imaging of the head, cervical spine, and maxillofacial structures were performed using the standard protocol without intravenous contrast. Multiplanar CT image reconstructions of the cervical spine and maxillofacial structures were also generated. COMPARISON:  06/06/2017 and prior CTs FINDINGS: CT HEAD FINDINGS Brain: No evidence of acute infarction, hemorrhage, hydrocephalus, extra-axial collection or mass lesion/mass effect. Atrophy and  chronic small-vessel white matter ischemic changes noted. Vascular: Atherosclerotic calcifications again identified. Skull: Normal. Negative for fracture or focal lesion. Other: None. CT MAXILLOFACIAL FINDINGS Osseous: A nondisplaced fracture at the tip of the nasal bone noted. No other acute bony abnormalities are identified. Orbits: Negative. No traumatic or inflammatory finding. Sinuses: Clear. Soft tissues: LEFT facial soft tissue swelling noted. CT CERVICAL SPINE FINDINGS Alignment: 3 mm anterolisthesis of C3 on C4 is unchanged. No acute subluxation. Skull base and vertebrae: No acute fracture. No primary bone lesion or focal pathologic process. Soft tissues and spinal canal: No prevertebral fluid or swelling. No visible canal hematoma. Disc levels: Mild multilevel degenerative disc disease/spondylosis and LEFT facet arthropathy again noted. Upper chest: No acute abnormality Other: None IMPRESSION: 1. No evidence of acute intracranial abnormality. Atrophy and chronic small-vessel white matter ischemic changes. 2. Nondisplaced fracture at the nasal bone tip. LEFT facial soft tissue swelling. 3. No static evidence of acute injury to the cervical spine. Degenerative changes again noted. Electronically Signed   By: Harmon Pier M.D.   On: 08/19/2017 15:16   Ct Maxillofacial Wo Cm  Result Date: 08/19/2017 CLINICAL DATA:  84 year old female with head, face and neck injury following fall. LEFT facial bleeding. EXAM: CT HEAD WITHOUT CONTRAST CT MAXILLOFACIAL WITHOUT CONTRAST CT  CERVICAL SPINE WITHOUT CONTRAST TECHNIQUE: Multidetector CT imaging of the head, cervical spine, and maxillofacial structures were performed using the standard protocol without intravenous contrast. Multiplanar CT image reconstructions of the cervical spine and maxillofacial structures were also generated. COMPARISON:  06/06/2017 and prior CTs FINDINGS: CT HEAD FINDINGS Brain: No evidence of acute infarction, hemorrhage, hydrocephalus,  extra-axial collection or mass lesion/mass effect. Atrophy and chronic small-vessel white matter ischemic changes noted. Vascular: Atherosclerotic calcifications again identified. Skull: Normal. Negative for fracture or focal lesion. Other: None. CT MAXILLOFACIAL FINDINGS Osseous: A nondisplaced fracture at the tip of the nasal bone noted. No other acute bony abnormalities are identified. Orbits: Negative. No traumatic or inflammatory finding. Sinuses: Clear. Soft tissues: LEFT facial soft tissue swelling noted. CT CERVICAL SPINE FINDINGS Alignment: 3 mm anterolisthesis of C3 on C4 is unchanged. No acute subluxation. Skull base and vertebrae: No acute fracture. No primary bone lesion or focal pathologic process. Soft tissues and spinal canal: No prevertebral fluid or swelling. No visible canal hematoma. Disc levels: Mild multilevel degenerative disc disease/spondylosis and LEFT facet arthropathy again noted. Upper chest: No acute abnormality Other: None IMPRESSION: 1. No evidence of acute intracranial abnormality. Atrophy and chronic small-vessel white matter ischemic changes. 2. Nondisplaced fracture at the nasal bone tip. LEFT facial soft tissue swelling. 3. No static evidence of acute injury to the cervical spine. Degenerative changes again noted. Electronically Signed   By: Harmon Pier M.D.   On: 08/19/2017 15:16                             Assessment and Plan: Full thickness left lower eyelid laceration.  Fortunately, the globe looks good.  Plan:  Repair in ER   The patients face was cleansed with betadine and irrigated with saline repeatedly.  2% lidocaine with epi was injected into the left lower lid.  Betadine was used to prep the left periocular area and a sterile drape was placed.  4-0 Chromic gut suture was used to reattach the lower lid to the medical canthus.  The canaliculus could bot be identified.  5-0 vicryl sutures were used to realign the lid margin and to close the deeper layers of  orbicularis and tarsus.  The skin with closed with interrruped 5-0 fast absorbing gut suture.    The periocular area was cleansed again, and erythromycin ophthalmic ung was applied.  Recommend: continue the erythromycin ung qid and start oral antibiotics for one week.  Follow-up with me in one week (September 26)    All of the above information was relayed to the attending ER physician.Marland Kitchen  Ophthalmic warning signs and symptoms were reviewed, and clear instructions for immediate phone contact and/or immediate return to the ED or clinic were provided should any of these signs or symptoms occur.  Follow up contact information was provided.  All questions were answered.   Hendry Speas L 08/19/2017, 4:02 PM  [facility] [phone]

## 2017-08-19 NOTE — ED Triage Notes (Signed)
Pt arrived via [facility] from [facility] after an unwitnessed fall with unknown LOC. Pt was found bleeding from the left eye. Per facility paperwork, pt is not on blood thinners. Pt is alert and oriented x4. No other complaints at time of triage.

## 2017-08-19 NOTE — ED Notes (Signed)
Report called to [facility] by Baird Lyonsasey RN, pt to be taken home by [facility]. VSS,NAD.

## 2017-08-19 NOTE — ED Notes (Signed)
Ian Malkinlfred Degan - POA - [phone]

## 2017-08-30 ENCOUNTER — Emergency Department (HOSPITAL_COMMUNITY): Payer: Medicare Other

## 2017-08-30 ENCOUNTER — Encounter (HOSPITAL_COMMUNITY): Payer: Self-pay | Admitting: Emergency Medicine

## 2017-08-30 ENCOUNTER — Emergency Department (HOSPITAL_COMMUNITY)
Admission: EM | Admit: 2017-08-30 | Discharge: 2017-08-30 | Disposition: A | Discharge status: Home / Self Care | Payer: Medicare Other | Attending: Emergency Medicine | Admitting: Emergency Medicine

## 2017-08-30 ENCOUNTER — Other Ambulatory Visit: Payer: Self-pay

## 2017-08-30 DIAGNOSIS — Y999 Unspecified external cause status: Secondary | ICD-10-CM | POA: Diagnosis not present

## 2017-08-30 DIAGNOSIS — I2510 Atherosclerotic heart disease of native coronary artery without angina pectoris: Secondary | ICD-10-CM | POA: Diagnosis not present

## 2017-08-30 DIAGNOSIS — Y9389 Activity, other specified: Secondary | ICD-10-CM | POA: Insufficient documentation

## 2017-08-30 DIAGNOSIS — S0990XA Unspecified injury of head, initial encounter: Secondary | ICD-10-CM | POA: Diagnosis not present

## 2017-08-30 DIAGNOSIS — W050XXA Fall from non-moving wheelchair, initial encounter: Secondary | ICD-10-CM | POA: Diagnosis not present

## 2017-08-30 DIAGNOSIS — J45909 Unspecified asthma, uncomplicated: Secondary | ICD-10-CM | POA: Diagnosis not present

## 2017-08-30 DIAGNOSIS — I10 Essential (primary) hypertension: Secondary | ICD-10-CM | POA: Insufficient documentation

## 2017-08-30 DIAGNOSIS — F0390 Unspecified dementia without behavioral disturbance: Secondary | ICD-10-CM | POA: Diagnosis not present

## 2017-08-30 DIAGNOSIS — W19XXXA Unspecified fall, initial encounter: Secondary | ICD-10-CM

## 2017-08-30 DIAGNOSIS — Z79899 Other long term (current) drug therapy: Secondary | ICD-10-CM | POA: Insufficient documentation

## 2017-08-30 DIAGNOSIS — S0083XA Contusion of other part of head, initial encounter: Secondary | ICD-10-CM | POA: Diagnosis not present

## 2017-08-30 DIAGNOSIS — Y92129 Unspecified place in nursing home as the place of occurrence of the external cause: Secondary | ICD-10-CM | POA: Insufficient documentation

## 2017-08-30 NOTE — Discharge Instructions (Addendum)
Return here as needed. follow-up with your primary doctor. °

## 2017-08-30 NOTE — ED Notes (Signed)
[facility] called for transport.  

## 2017-08-30 NOTE — ED Notes (Signed)
[facility] Expected date:  Expected time:  Means of arrival:  Comments: [facility]

## 2017-08-30 NOTE — Care Management Note (Signed)
Case Management Note  Patient Details  Name: [name] MRN: [id number] Date of Birth: 03/27/1930  CM [facility]'s [facility].  CM noted multiple ED encounters with falls.  [facility]    [facility].  He accepted pt for services.  CM discussed with Kenard GowerDrew the possible need to increase pt's level of care to SNF given so many falls.  No further CM needs noted at this time.  Expected Discharge Date:   08/30/2017               Expected Discharge Plan:  Assisted Living / Rest Home(HH)  Discharge planning Services  CM Consult  Post Acute Care Choice:  Home Health Choice offered to:  NA  HH Arranged:  RN, PT, OT, Social Work [facility]  Status of Service:  Completed, signed off  Icela Glymph, Lynnae Sandhoffngela N, RN 08/30/2017, 1:17 PM

## 2017-08-30 NOTE — ED Triage Notes (Signed)
[facility]. Pt had a witnessed fall, which she does frequently. Pt hit head on baseboard. No deformities noted. Pt has no complaints. Pt has hx of dementia.

## 2017-09-02 NOTE — ED Provider Notes (Signed)
[facility]   CSN: [id number] Arrival date & time: 08/30/17  2952     History   Chief Complaint Chief Complaint  Patient presents with  . Fall    HPI [name] is a 84 y.o. female.  HPI Patient presents to the emergency department with injuries following a fall that was witnessed by staff at her nursing facility.  The patient has contusion to the left side of her head and face.  She has frequent falls at the nursing facility.  She is unable to give any history due to dementia.  Patient states she has no complaints of pain at this time.  Staff stated that she was attempting to get up from the wheelchair when she fell forward hitting the wall. Past Medical History:  Diagnosis Date  . ALLERGIC RHINITIS 11/09/2006  . Asthma   . ASTHMATIC BRONCHITIS, ACUTE 01/14/2007  . BACK PAIN 03/01/2009  . BLURRED VISION 06/29/2008  . CEREBROVASCULAR ACCIDENT, HX OF 07/24/2006  . CHEST PAIN 09/12/2007  . Chronic cough   . CONSTIPATION 03/01/2009  . CORONARY ARTERY DISEASE 07/24/2006  . Coronary atherosclerosis of native coronary artery   . Cough 02/01/2010  . Degenerative disc disease   . FATIGUE 01/21/2008  . GERD 06/04/2006  . Goiter, unspecified 02/01/2010  . HYPERLIPIDEMIA 12/20/2006  . HYPERTENSION 11/09/2006  . INSOMNIA-SLEEP DISORDER-UNSPEC 11/09/2006  . OSTEOPOROSIS 10/13/2009  . Paroxysmal atrial fibrillation (HCC)   . Recurrent major depression (HCC)   . Senile dementia, uncomplicated 01/06/2009  . Stroke [facility])     Patient Active Problem List   Diagnosis Date Noted  . Hypokalemia 02/26/2015  . Anemia of chronic disease 02/26/2015  . Fall 02/25/2015  . Forehead laceration 02/25/2015  . UTI (lower urinary tract infection) 02/25/2015  . Senile dementia, uncomplicated 01/06/2009    Past Surgical History:  Procedure Laterality Date  . DILATION AND CURETTAGE OF UTERUS       OB History   None      Home Medications    Prior to  Admission medications   Medication Sig Start Date End Date Taking? Authorizing Provider  acetaminophen (TYLENOL) 325 MG tablet Take 650 mg by mouth every 6 (six) hours as needed for mild pain or moderate pain.    Yes [provider]  albuterol (PROVENTIL HFA;VENTOLIN HFA) 108 (90 Base) MCG/ACT inhaler Inhale 2 puffs into the lungs every 4 (four) hours as needed for wheezing or shortness of breath.   Yes [provider]  Calcium Carbonate-Vitamin D (CALCIUM 600/VITAMIN D) 600-400 MG-UNIT per tablet Take 1 tablet by mouth 2 (two) times daily.   Yes [provider]  cetirizine (ZYRTEC) 10 MG tablet Take 10 mg by mouth daily.   Yes [provider]  docusate sodium (COLACE) 100 MG capsule Take 1 capsule (100 mg total) by mouth every 12 (twelve) hours. Take while on lortab to avoid constipation. 06/07/16  Yes Jacalyn Lefevre, MD  donepezil (ARICEPT) 10 MG tablet Take 10 mg by mouth at bedtime.   Yes Corwin Levins, MD  Ergocalciferol 2000 units TABS Take 2,000 Units by mouth daily.    Yes [provider]  escitalopram (LEXAPRO) 10 MG tablet Take 10 mg by mouth daily.    Yes [provider]  Fluticasone Furoate-Vilanterol (BREO ELLIPTA) 100-25 MCG/INH AEPB Inhale 1 puff into the lungs daily.    Yes [provider]  HYDROcodone-acetaminophen (NORCO/VICODIN) 5-325 MG tablet Take 1 tablet by mouth every 4 (  four) hours as needed for moderate pain.   Yes [provider]  ketoconazole (NIZORAL) 2 % cream Apply 1 application topically every 12 (twelve) hours as needed for irritation (and/or redness). buttocks    Yes [provider]  loperamide (IMODIUM) 2 MG capsule Take 2 mg by mouth every 6 (six) hours as needed for diarrhea or loose stools.   Yes [provider]  montelukast (SINGULAIR) 10 MG tablet Take 10 mg by mouth daily.    Yes [provider]  Multiple Vitamin (TAB-A-VITE PO) Take 1 tablet by mouth daily.   Yes  [provider]  neomycin-polymyxin-dexameth (MAXITROL) 0.1 % OINT Place 1 application into the left eye at bedtime. For 14 days. 08/28/17 09/11/17 Yes [provider]  omeprazole (PRILOSEC) 40 MG capsule Take 40 mg by mouth daily.   Yes [provider]  ondansetron (ZOFRAN) 4 MG tablet Take 4 mg by mouth every 4 (four) hours as needed for nausea or vomiting.   Yes [provider]  pravastatin (PRAVACHOL) 40 MG tablet Take 40 mg by mouth at bedtime.    Yes [provider]  risperiDONE (RISPERDAL) 0.25 MG tablet Take 0.125 mg by mouth every 12 (twelve) hours as needed (anxiety or aggression).   Yes [provider]  Skin Protectants, Misc. (EUCERIN) cream Apply 1 application topically every evening.   Yes [provider]  triamcinolone cream (KENALOG) 0.1 % Apply 1 application topically every 12 (twelve) hours as needed (for itching on arms and glutes).    Yes [provider]  Zinc Oxide (DESITIN) 13 % CREA Apply 1 application topically every 8 (eight) hours as needed (for redness/chaffing on buttocks).   Yes [provider]  cephALEXin (KEFLEX) 250 MG capsule Take 1 capsule (250 mg total) by mouth 4 (four) times daily. Patient not taking: Reported on 08/30/2017 08/19/17   Terrilee Files, MD  erythromycin ophthalmic ointment Place a 1/2 inch ribbon of ointment into the left lower eyelid QID Patient not taking: Reported on 08/30/2017 08/19/17   Terrilee Files, MD    Family History Family History  Problem Relation Age of Onset  . Coronary artery disease Other     Social History Social History   Tobacco Use  . Smoking status: Never Smoker  . Smokeless tobacco: Never Used  Substance Use Topics  . Alcohol use: No  . Drug use: No     Allergies   Patient has no known allergies.   Review of Systems Review of Systems Level 5 caveat applies due to dementia  Physical Exam Updated Vital Signs BP (!) 145/78  (BP Location: Right Arm)   Pulse 87   Temp 98 F (36.7 C) (Oral)   Resp 16   SpO2 100%   Physical Exam  Constitutional: She is oriented to person, place, and time. She appears well-developed and well-nourished. No distress.  HENT:  Head: Normocephalic.    Eyes: Pupils are equal, round, and reactive to light.  Neck: Normal range of motion.  Cardiovascular: Normal rate, regular rhythm and normal heart sounds. Exam reveals no gallop and no friction rub.  No murmur heard. Pulmonary/Chest: Effort normal and breath sounds normal.  Neurological: She is alert and oriented to person, place, and time. She exhibits normal muscle tone. Coordination normal.  Skin: Skin is warm and dry.  Psychiatric: She has a normal mood and affect.  Nursing note and vitals reviewed.    ED Treatments / Results  Labs (all labs ordered  are listed, but only abnormal results are displayed) Labs Reviewed - No data to display  EKG None  Radiology No results found.  Procedures Procedures (including critical care time)  Medications Ordered in ED Medications - No data to display   Initial Impression / Assessment and Plan / ED Course  I have reviewed the triage vital signs and the nursing notes.  Pertinent labs & imaging results that were available during my care of the patient were reviewed by me and considered in my medical decision making (see chart for details).     Patient CT scan of the head and neck were negative.  The patient is neurologically at her baseline.  She has no pain in the hips.  Patient be referred back to her primary doctor and told to return here for any worsening her condition. Final Clinical Impressions(s) / ED Diagnoses   Final diagnoses:  Fall, initial encounter  Contusion of face, initial encounter  Injury of head, initial encounter    ED Discharge Orders    None       Charlestine Night, PA-C 09/02/17 1640    Cathren Laine, MD 09/04/17 1421

## 2017-09-21 ENCOUNTER — Non-Acute Institutional Stay: Payer: Medicare Other | Admitting: Nurse Practitioner

## 2017-09-21 DIAGNOSIS — Z515 Encounter for palliative care: Secondary | ICD-10-CM

## 2017-09-21 DIAGNOSIS — R269 Unspecified abnormalities of gait and mobility: Secondary | ICD-10-CM

## 2017-09-21 DIAGNOSIS — F0390 Unspecified dementia without behavioral disturbance: Secondary | ICD-10-CM

## 2017-09-21 NOTE — Progress Notes (Signed)
PALLIATIVE CARE CONSULT VISIT   PATIENT NAME: [name] DOB: 1930/09/29 MRN: [id number]  PRIMARY CARE PROVIDER:   Florentina Jenny, MD  REFERRING PROVIDER:  Florentina Jenny, MD [phone] [address] [place], [place] [address]  RESPONSIBLE PARTY:  Galena Logie (son) [phone] (home) [phone] (mobile  HISTORY OF PRESENT ILLNESS:  [name] is a 84 y.o. year old female with multiple medical problems including dementia, gait disturbance, frequent falls. Palliative Care was asked to help with symptom management and to  address goals of care.   ASSESSMENT/RECOMMENDATIONS and PLAN:  Dementia with behavioral disturbance -gait disorder - Frequent traumatic falls; 2 falls in September 2019 -oriented to self only -PRN risperdal -8/18 unwitnessed fall with lid laceration and nondisplaced nasal fracture -erythromycin oph. ointment -cephalexin, and hydrocodone;PRN tylenol 8/29 witnessed fall ; contusion of left side of head and face -seen by ENT; no surgery; nasal saline spray -followed by [facility] -Aricept  GERD HLD Asthma Allergic rhinitis Depression (recurrent) Constipation Osteoporosis CAD Atrial fibrillation -prilosec -pravastatin -singulair -albuterol inhaler -Breo-Ellipta -lexapro -colace -vitamin D, calcium carbonate   ACP -DNR/MOST    I spent 60 minutes providing this consultation,  from 10:30 to 11:30. More than 50% of the time in this consultation was spent coordinating communication.     CODE STATUS: DNR/MOST  PPS: 30% HOSPICE ELIGIBILITY/DIAGNOSIS: TBD  PAST MEDICAL HISTORY:  Past Medical History:  Diagnosis Date  . ALLERGIC RHINITIS 11/09/2006  . Asthma   . ASTHMATIC BRONCHITIS, ACUTE 01/14/2007  . BACK PAIN 03/01/2009  . BLURRED VISION 06/29/2008  . CEREBROVASCULAR ACCIDENT, HX OF 07/24/2006  . CHEST PAIN 09/12/2007  . Chronic cough   . CONSTIPATION 03/01/2009  . CORONARY ARTERY DISEASE 07/24/2006  . Coronary atherosclerosis of  native coronary artery   . Cough 02/01/2010  . Degenerative disc disease   . FATIGUE 01/21/2008  . GERD 06/04/2006  . Goiter, unspecified 02/01/2010  . HYPERLIPIDEMIA 12/20/2006  . HYPERTENSION 11/09/2006  . INSOMNIA-SLEEP DISORDER-UNSPEC 11/09/2006  . OSTEOPOROSIS 10/13/2009  . Paroxysmal atrial fibrillation (HCC)   . Recurrent major depression (HCC)   . Senile dementia, uncomplicated 01/06/2009  . Stroke [facility])     SOCIAL HX:  Social History   Tobacco Use  . Smoking status: Never Smoker  . Smokeless tobacco: Never Used  Substance Use Topics  . Alcohol use: No    ALLERGIES: No Known Allergies   PERTINENT MEDICATIONS:  Outpatient Encounter Medications as of 09/21/2017  Medication Sig  . acetaminophen (TYLENOL) 325 MG tablet Take 650 mg by mouth every 6 (six) hours as needed for mild pain or moderate pain.   Marland Kitchen albuterol (PROVENTIL HFA;VENTOLIN HFA) 108 (90 Base) MCG/ACT inhaler Inhale 2 puffs into the lungs every 4 (four) hours as needed for wheezing or shortness of breath.  . Calcium Carbonate-Vitamin D (CALCIUM 600/VITAMIN D) 600-400 MG-UNIT per tablet Take 1 tablet by mouth 2 (two) times daily.  . cephALEXin (KEFLEX) 250 MG capsule Take 1 capsule (250 mg total) by mouth 4 (four) times daily. (Patient not taking: Reported on 08/30/2017)  . cetirizine (ZYRTEC) 10 MG tablet Take 10 mg by mouth daily.  Marland Kitchen docusate sodium (COLACE) 100 MG capsule Take 1 capsule (100 mg total) by mouth every 12 (twelve) hours. Take while on lortab to avoid constipation.  Marland Kitchen donepezil (ARICEPT) 10 MG tablet Take 10 mg by mouth at bedtime.  . Ergocalciferol 2000 units TABS Take 2,000 Units by mouth daily.   Marland Kitchen erythromycin ophthalmic ointment Place a  1/2 inch ribbon of ointment into the left lower eyelid QID (Patient not taking: Reported on 08/30/2017)  . escitalopram (LEXAPRO) 10 MG tablet Take 10 mg by mouth daily.   . Fluticasone Furoate-Vilanterol (BREO ELLIPTA) 100-25 MCG/INH AEPB Inhale 1 puff into the lungs  daily.   Marland Kitchen. HYDROcodone-acetaminophen (NORCO/VICODIN) 5-325 MG tablet Take 1 tablet by mouth every 4 (four) hours as needed for moderate pain.  Marland Kitchen. ketoconazole (NIZORAL) 2 % cream Apply 1 application topically every 12 (twelve) hours as needed for irritation (and/or redness). buttocks   . loperamide (IMODIUM) 2 MG capsule Take 2 mg by mouth every 6 (six) hours as needed for diarrhea or loose stools.  . montelukast (SINGULAIR) 10 MG tablet Take 10 mg by mouth daily.   . Multiple Vitamin (TAB-A-VITE PO) Take 1 tablet by mouth daily.  Marland Kitchen. omeprazole (PRILOSEC) 40 MG capsule Take 40 mg by mouth daily.  . ondansetron (ZOFRAN) 4 MG tablet Take 4 mg by mouth every 4 (four) hours as needed for nausea or vomiting.  . pravastatin (PRAVACHOL) 40 MG tablet Take 40 mg by mouth at bedtime.   . risperiDONE (RISPERDAL) 0.25 MG tablet Take 0.125 mg by mouth every 12 (twelve) hours as needed (anxiety or aggression).  . Skin Protectants, Misc. (EUCERIN) cream Apply 1 application topically every evening.  . triamcinolone cream (KENALOG) 0.1 % Apply 1 application topically every 12 (twelve) hours as needed (for itching on arms and glutes).   . Zinc Oxide (DESITIN) 13 % CREA Apply 1 application topically every 8 (eight) hours as needed (for redness/chaffing on buttocks).   No facility-administered encounter medications on file as of 09/21/2017.     PHYSICAL EXAM:   General: NAD, frail appearing, thin Cardiovascular: regular rate and rhythm Pulmonary: clear ant fields Abdomen: soft, nontender, + bowel sounds GU: no suprapubic tenderness Extremities: no edema, no joint deformities Skin: no rashes Neurological: Weakness but otherwise nonfocal  Cletus Paris G [place], NP

## 2017-09-29 ENCOUNTER — Encounter (HOSPITAL_COMMUNITY): Payer: Self-pay

## 2017-09-29 ENCOUNTER — Other Ambulatory Visit: Payer: Self-pay

## 2017-09-29 ENCOUNTER — Emergency Department (HOSPITAL_COMMUNITY): Payer: Medicare Other

## 2017-09-29 ENCOUNTER — Emergency Department (HOSPITAL_COMMUNITY)
Admission: EM | Admit: 2017-09-29 | Discharge: 2017-09-29 | Disposition: A | Discharge status: Home / Self Care | Payer: Medicare Other | Attending: Emergency Medicine | Admitting: Emergency Medicine

## 2017-09-29 DIAGNOSIS — S0083XA Contusion of other part of head, initial encounter: Secondary | ICD-10-CM

## 2017-09-29 DIAGNOSIS — I10 Essential (primary) hypertension: Secondary | ICD-10-CM | POA: Diagnosis not present

## 2017-09-29 DIAGNOSIS — F0390 Unspecified dementia without behavioral disturbance: Secondary | ICD-10-CM | POA: Insufficient documentation

## 2017-09-29 DIAGNOSIS — Y999 Unspecified external cause status: Secondary | ICD-10-CM | POA: Insufficient documentation

## 2017-09-29 DIAGNOSIS — Y92129 Unspecified place in nursing home as the place of occurrence of the external cause: Secondary | ICD-10-CM | POA: Insufficient documentation

## 2017-09-29 DIAGNOSIS — W19XXXA Unspecified fall, initial encounter: Secondary | ICD-10-CM

## 2017-09-29 DIAGNOSIS — Y939 Activity, unspecified: Secondary | ICD-10-CM | POA: Insufficient documentation

## 2017-09-29 DIAGNOSIS — W1830XA Fall on same level, unspecified, initial encounter: Secondary | ICD-10-CM | POA: Diagnosis not present

## 2017-09-29 DIAGNOSIS — R040 Epistaxis: Secondary | ICD-10-CM | POA: Diagnosis not present

## 2017-09-29 DIAGNOSIS — I2510 Atherosclerotic heart disease of native coronary artery without angina pectoris: Secondary | ICD-10-CM | POA: Insufficient documentation

## 2017-09-29 DIAGNOSIS — Z79899 Other long term (current) drug therapy: Secondary | ICD-10-CM | POA: Diagnosis not present

## 2017-09-29 DIAGNOSIS — S0990XA Unspecified injury of head, initial encounter: Secondary | ICD-10-CM

## 2017-09-29 NOTE — ED Notes (Signed)
Bed: WHALB Expected date:  Expected time:  Means of arrival:  Comments: 

## 2017-09-29 NOTE — ED Triage Notes (Signed)
Per EMS, Pt arriving from [facility]. Pt had an unwitnessed fall. Currently has a hematoma above left eye, nose bleed, and swollen lip. No other deformity or trauma. Pt has advanced dementia and is A&O 1x. Not currently on any blood thinners, vital signs stable.

## 2017-09-29 NOTE — ED Notes (Signed)
Bed: ZO10 Expected date:  Expected time:  Means of arrival:  Comments: 84 yr old fall, hematoma

## 2017-09-29 NOTE — ED Notes (Signed)
Patient transported to CT, unable to obtain vitals

## 2017-09-29 NOTE — ED Provider Notes (Signed)
[facility]-EMERGENCY DEPT Provider Note   CSN: [id number] Arrival date & time: 09/29/17  0018     History   Chief Complaint Chief Complaint  Patient presents with  . Fall   Level 5 caveat due to dementia HPI SHALISE ROSADO is a 84 y.o. female.  The history is provided by the patient and the nursing home. The history is limited by the condition of the patient.  Fall  This is a new problem. Episode onset: Unknown. The problem occurs constantly. The problem has not changed since onset.Nothing aggravates the symptoms. Nothing relieves the symptoms.   Patient with history of dementia, CVA, CAD presents for fall.  She lives in assisted living facility.  Per nursing report, she was found on the floor.  She had signs of a hematoma to her face and a bloody nose.  No other findings of trauma.  No other acute issues at this time.  She is not currently on anticoagulants Past Medical History:  Diagnosis Date  . ALLERGIC RHINITIS 11/09/2006  . Asthma   . ASTHMATIC BRONCHITIS, ACUTE 01/14/2007  . BACK PAIN 03/01/2009  . BLURRED VISION 06/29/2008  . CEREBROVASCULAR ACCIDENT, HX OF 07/24/2006  . CHEST PAIN 09/12/2007  . Chronic cough   . CONSTIPATION 03/01/2009  . CORONARY ARTERY DISEASE 07/24/2006  . Coronary atherosclerosis of native coronary artery   . Cough 02/01/2010  . Degenerative disc disease   . FATIGUE 01/21/2008  . GERD 06/04/2006  . Goiter, unspecified 02/01/2010  . HYPERLIPIDEMIA 12/20/2006  . HYPERTENSION 11/09/2006  . INSOMNIA-SLEEP DISORDER-UNSPEC 11/09/2006  . OSTEOPOROSIS 10/13/2009  . Paroxysmal atrial fibrillation (HCC)   . Recurrent major depression (HCC)   . Senile dementia, uncomplicated 01/06/2009  . Stroke [facility])     Patient Active Problem List   Diagnosis Date Noted  . Hypokalemia 02/26/2015  . Anemia of chronic disease 02/26/2015  . Fall 02/25/2015  . Forehead laceration 02/25/2015  . UTI (lower urinary tract infection) 02/25/2015  . Senile  dementia, uncomplicated 01/06/2009    Past Surgical History:  Procedure Laterality Date  . DILATION AND CURETTAGE OF UTERUS       OB History   None      Home Medications    Prior to Admission medications   Medication Sig Start Date End Date Taking? Authorizing Provider  acetaminophen (TYLENOL) 325 MG tablet Take 650 mg by mouth every 6 (six) hours as needed for mild pain or moderate pain.     [provider]  albuterol (PROVENTIL HFA;VENTOLIN HFA) 108 (90 Base) MCG/ACT inhaler Inhale 2 puffs into the lungs every 4 (four) hours as needed for wheezing or shortness of breath.    [provider]  Calcium Carbonate-Vitamin D (CALCIUM 600/VITAMIN D) 600-400 MG-UNIT per tablet Take 1 tablet by mouth 2 (two) times daily.    [provider]  cephALEXin (KEFLEX) 250 MG capsule Take 1 capsule (250 mg total) by mouth 4 (four) times daily. Patient not taking: Reported on 08/30/2017 08/19/17   Terrilee Files, MD  cetirizine (ZYRTEC) 10 MG tablet Take 10 mg by mouth daily.    [provider]  docusate sodium (COLACE) 100 MG capsule Take 1 capsule (100 mg total) by mouth every 12 (twelve) hours. Take while on lortab to avoid constipation. 06/07/16   Jacalyn Lefevre, MD  donepezil (ARICEPT) 10 MG tablet Take 10 mg by mouth at bedtime.    Corwin Levins, MD  Ergocalciferol 2000 units TABS Take 2,000 Units by  mouth daily.     [provider]  erythromycin ophthalmic ointment Place a 1/2 inch ribbon of ointment into the left lower eyelid QID Patient not taking: Reported on 08/30/2017 08/19/17   Terrilee Files, MD  escitalopram (LEXAPRO) 10 MG tablet Take 10 mg by mouth daily.     [provider]  Fluticasone Furoate-Vilanterol (BREO ELLIPTA) 100-25 MCG/INH AEPB Inhale 1 puff into the lungs daily.     [provider]  HYDROcodone-acetaminophen (NORCO/VICODIN) 5-325 MG tablet Take 1 tablet by mouth every 4 (four) hours as needed for moderate  pain.    [provider]  ketoconazole (NIZORAL) 2 % cream Apply 1 application topically every 12 (twelve) hours as needed for irritation (and/or redness). buttocks     [provider]  loperamide (IMODIUM) 2 MG capsule Take 2 mg by mouth every 6 (six) hours as needed for diarrhea or loose stools.    [provider]  montelukast (SINGULAIR) 10 MG tablet Take 10 mg by mouth daily.     [provider]  Multiple Vitamin (TAB-A-VITE PO) Take 1 tablet by mouth daily.    [provider]  omeprazole (PRILOSEC) 40 MG capsule Take 40 mg by mouth daily.    [provider]  ondansetron (ZOFRAN) 4 MG tablet Take 4 mg by mouth every 4 (four) hours as needed for nausea or vomiting.    [provider]  pravastatin (PRAVACHOL) 40 MG tablet Take 40 mg by mouth at bedtime.     [provider]  risperiDONE (RISPERDAL) 0.25 MG tablet Take 0.125 mg by mouth every 12 (twelve) hours as needed (anxiety or aggression).    [provider]  Skin Protectants, Misc. (EUCERIN) cream Apply 1 application topically every evening.    [provider]  triamcinolone cream (KENALOG) 0.1 % Apply 1 application topically every 12 (twelve) hours as needed (for itching on arms and glutes).     [provider]  Zinc Oxide (DESITIN) 13 % CREA Apply 1 application topically every 8 (eight) hours as needed (for redness/chaffing on buttocks).    [provider]    Family History Family History  Problem Relation Age of Onset  . Coronary artery disease Other     Social History Social History   Tobacco Use  . Smoking status: Never Smoker  . Smokeless tobacco: Never Used  Substance Use Topics  . Alcohol use: No  . Drug use: No     Allergies   Patient has no known allergies.   Review of Systems Review of Systems  Unable to perform ROS: Dementia     Physical Exam Updated Vital Signs Ht 1.753 m (5\' 9" )   Wt 68 kg   BMI  22.15 kg/m   Physical Exam  CONSTITUTIONAL: Elderly, frail HEAD: Hematoma to forehead, no signs of trauma EYES: EOMI/PERRL ENMT: Mucous membranes moist, blood noted in nares, no septal hematoma, bruising noted to lips, no laceration SPINE/BACK:entire spine nontender, no bruising/crepitance/stepoffs noted to spine CV: S1/S2 noted LUNGS: Lungs are clear to auscultation bilaterally, no apparent distress Chest-no tenderness of bruising ABDOMEN: soft, nontender NEURO: Pt is awake/alert, moves all extremitiesx4.  No facial droop.  Patient appears pleasantly confused EXTREMITIES: pulses normal/equal, full ROM, pelvis stable, all other extremities/joints palpated/ranged and nontender SKIN: warm, color normal  ED Treatments / Results  Labs (all labs ordered are listed, but only abnormal results are displayed) Labs Reviewed - No data to display  EKG None  Radiology Ct Head  Wo Contrast  Result Date: 09/29/2017 CLINICAL DATA:  84 year old female with fall and head injury. EXAM: CT HEAD WITHOUT CONTRAST CT MAXILLOFACIAL WITHOUT CONTRAST CT CERVICAL SPINE WITHOUT CONTRAST TECHNIQUE: Multidetector CT imaging of the head, cervical spine, and maxillofacial structures were performed using the standard protocol without intravenous contrast. Multiplanar CT image reconstructions of the cervical spine and maxillofacial structures were also generated. COMPARISON:  Head CT dated 08/30/2017 FINDINGS: CT HEAD FINDINGS Brain: There is moderate age-related atrophy and chronic microvascular ischemic changes. There is no acute intracranial hemorrhage. No mass effect or midline shift. No extra-axial fluid collection. Vascular: No hyperdense vessel or unexpected calcification. Skull: Normal. Negative for fracture or focal lesion. Other: None CT MAXILLOFACIAL FINDINGS Osseous: No fracture or mandibular dislocation. No destructive process. Orbits: Negative. No traumatic or inflammatory finding. Sinuses: Clear. Soft  tissues: Negative. CT CERVICAL SPINE FINDINGS Alignment: No acute subluxation.  Grade 1 C3-C4 anterolisthesis. Skull base and vertebrae: No acute fracture. No primary bone lesion or focal pathologic process. Soft tissues and spinal canal: No prevertebral fluid or swelling. No visible canal hematoma. Disc levels:  Degenerative changes.  No acute findings. Upper chest: Atherosclerotic calcification of the aorta as well as bilateral carotid calcified plaques. Other: None IMPRESSION: 1. No acute intracranial hemorrhage. 2. No acute/traumatic cervical spine pathology. 3. No acute facial bone fractures. Electronically Signed   By: Elgie Collard M.D.   On: 09/29/2017 02:46   Ct Cervical Spine Wo Contrast  Result Date: 09/29/2017 CLINICAL DATA:  84 year old female with fall and head injury. EXAM: CT HEAD WITHOUT CONTRAST CT MAXILLOFACIAL WITHOUT CONTRAST CT CERVICAL SPINE WITHOUT CONTRAST TECHNIQUE: Multidetector CT imaging of the head, cervical spine, and maxillofacial structures were performed using the standard protocol without intravenous contrast. Multiplanar CT image reconstructions of the cervical spine and maxillofacial structures were also generated. COMPARISON:  Head CT dated 08/30/2017 FINDINGS: CT HEAD FINDINGS Brain: There is moderate age-related atrophy and chronic microvascular ischemic changes. There is no acute intracranial hemorrhage. No mass effect or midline shift. No extra-axial fluid collection. Vascular: No hyperdense vessel or unexpected calcification. Skull: Normal. Negative for fracture or focal lesion. Other: None CT MAXILLOFACIAL FINDINGS Osseous: No fracture or mandibular dislocation. No destructive process. Orbits: Negative. No traumatic or inflammatory finding. Sinuses: Clear. Soft tissues: Negative. CT CERVICAL SPINE FINDINGS Alignment: No acute subluxation.  Grade 1 C3-C4 anterolisthesis. Skull base and vertebrae: No acute fracture. No primary bone lesion or focal pathologic process.  Soft tissues and spinal canal: No prevertebral fluid or swelling. No visible canal hematoma. Disc levels:  Degenerative changes.  No acute findings. Upper chest: Atherosclerotic calcification of the aorta as well as bilateral carotid calcified plaques. Other: None IMPRESSION: 1. No acute intracranial hemorrhage. 2. No acute/traumatic cervical spine pathology. 3. No acute facial bone fractures. Electronically Signed   By: Elgie Collard M.D.   On: 09/29/2017 02:46   Ct Maxillofacial Wo Contrast  Result Date: 09/29/2017 CLINICAL DATA:  84 year old female with fall and head injury. EXAM: CT HEAD WITHOUT CONTRAST CT MAXILLOFACIAL WITHOUT CONTRAST CT CERVICAL SPINE WITHOUT CONTRAST TECHNIQUE: Multidetector CT imaging of the head, cervical spine, and maxillofacial structures were performed using the standard protocol without intravenous contrast. Multiplanar CT image reconstructions of the cervical spine and maxillofacial structures were also generated. COMPARISON:  Head CT dated 08/30/2017 FINDINGS: CT HEAD FINDINGS Brain: There is moderate age-related atrophy and chronic microvascular ischemic changes. There is no acute intracranial hemorrhage. No mass effect or midline shift. No extra-axial fluid collection. Vascular: No hyperdense  vessel or unexpected calcification. Skull: Normal. Negative for fracture or focal lesion. Other: None CT MAXILLOFACIAL FINDINGS Osseous: No fracture or mandibular dislocation. No destructive process. Orbits: Negative. No traumatic or inflammatory finding. Sinuses: Clear. Soft tissues: Negative. CT CERVICAL SPINE FINDINGS Alignment: No acute subluxation.  Grade 1 C3-C4 anterolisthesis. Skull base and vertebrae: No acute fracture. No primary bone lesion or focal pathologic process. Soft tissues and spinal canal: No prevertebral fluid or swelling. No visible canal hematoma. Disc levels:  Degenerative changes.  No acute findings. Upper chest: Atherosclerotic calcification of the aorta as  well as bilateral carotid calcified plaques. Other: None IMPRESSION: 1. No acute intracranial hemorrhage. 2. No acute/traumatic cervical spine pathology. 3. No acute facial bone fractures. Electronically Signed   By: Elgie Collard M.D.   On: 09/29/2017 02:46    Procedures Procedures (including critical care time)  Medications Ordered in ED Medications - No data to display   Initial Impression / Assessment and Plan / ED Course  I have reviewed the triage vital signs and the nursing notes.  Pertinent imaging results that were available during my care of the patient were reviewed by me and considered in my medical decision making (see chart for details).     Pt presents after fall at nursing facility She has bruising and abrasions to face, but no acute traumatic injuries.  History is limited due to history of dementia.  Otherwise she is appropriate discharge back to facility  Final Clinical Impressions(s) / ED Diagnoses   Final diagnoses:  Fall, initial encounter  Contusion of face, initial encounter  Injury of head, initial encounter    ED Discharge Orders    None       Zadie Rhine, MD 09/29/17 [phone]

## 2017-09-29 NOTE — ED Notes (Signed)
[facility] CALLED FOR TRANSPORT. 

## 2017-10-11 ENCOUNTER — Telehealth: Payer: Self-pay

## 2017-10-11 NOTE — Telephone Encounter (Signed)
Received verbal order for hospice eval per Kathe Becton NP

## 2018-09-03 DEATH — deceased
